# Patient Record
Sex: Female | Born: 1950 | Race: White | Hispanic: No | Marital: Married | Smoking: Never smoker
Health system: Southern US, Community
[De-identification: ages and names within clinical notes are randomized; demographics above are authoritative.]

## PROBLEM LIST (undated history)

## (undated) DIAGNOSIS — Z8371 Family history of colonic polyps: Secondary | ICD-10-CM

## (undated) DIAGNOSIS — E7800 Pure hypercholesterolemia, unspecified: Secondary | ICD-10-CM

## (undated) DIAGNOSIS — T7840XA Allergy, unspecified, initial encounter: Secondary | ICD-10-CM

## (undated) DIAGNOSIS — J189 Pneumonia, unspecified organism: Secondary | ICD-10-CM

## (undated) DIAGNOSIS — K648 Other hemorrhoids: Secondary | ICD-10-CM

## (undated) DIAGNOSIS — E669 Obesity, unspecified: Secondary | ICD-10-CM

## (undated) DIAGNOSIS — M8580 Other specified disorders of bone density and structure, unspecified site: Secondary | ICD-10-CM

## (undated) DIAGNOSIS — K219 Gastro-esophageal reflux disease without esophagitis: Secondary | ICD-10-CM

## (undated) DIAGNOSIS — M1990 Unspecified osteoarthritis, unspecified site: Secondary | ICD-10-CM

## (undated) DIAGNOSIS — Z83719 Family history of colon polyps, unspecified: Secondary | ICD-10-CM

## (undated) HISTORY — DX: Other specified disorders of bone density and structure, unspecified site: M85.80

## (undated) HISTORY — DX: Pure hypercholesterolemia, unspecified: E78.00

## (undated) HISTORY — DX: Obesity, unspecified: E66.9

## (undated) HISTORY — PX: ABDOMINAL HYSTERECTOMY: SHX81

## (undated) HISTORY — PX: ANAL FISSURE REPAIR: SHX2312

## (undated) HISTORY — DX: Allergy, unspecified, initial encounter: T78.40XA

## (undated) HISTORY — DX: Family history of colon polyps, unspecified: Z83.719

## (undated) HISTORY — DX: Pneumonia, unspecified organism: J18.9

## (undated) HISTORY — PX: BREAST SURGERY: SHX581

## (undated) HISTORY — DX: Other hemorrhoids: K64.8

## (undated) HISTORY — DX: Family history of colonic polyps: Z83.71

## (undated) HISTORY — PX: APPENDECTOMY: SHX54

## (undated) HISTORY — PX: OTHER SURGICAL HISTORY: SHX169

## (undated) HISTORY — PX: BREAST BIOPSY: SHX20

## (undated) HISTORY — PX: CERVICAL CONE BIOPSY: SUR198

## (undated) HISTORY — PX: SPINE SURGERY: SHX786

---

## 1998-07-24 HISTORY — PX: BACK SURGERY: SHX140

## 1999-07-20 ENCOUNTER — Encounter: Payer: Self-pay | Admitting: Neurosurgery

## 1999-07-21 ENCOUNTER — Inpatient Hospital Stay (HOSPITAL_COMMUNITY): Admission: RE | Admit: 1999-07-21 | Discharge: 1999-07-23 | Payer: Self-pay | Admitting: Neurosurgery

## 1999-08-18 ENCOUNTER — Ambulatory Visit (HOSPITAL_COMMUNITY): Admission: RE | Admit: 1999-08-18 | Discharge: 1999-08-18 | Payer: Self-pay | Admitting: Neurosurgery

## 1999-08-18 ENCOUNTER — Encounter: Payer: Self-pay | Admitting: Neurosurgery

## 1999-09-07 ENCOUNTER — Ambulatory Visit (HOSPITAL_COMMUNITY): Admission: RE | Admit: 1999-09-07 | Discharge: 1999-09-07 | Payer: Self-pay | Admitting: Neurosurgery

## 1999-09-07 ENCOUNTER — Encounter: Payer: Self-pay | Admitting: Neurosurgery

## 2000-12-13 ENCOUNTER — Ambulatory Visit (HOSPITAL_COMMUNITY): Admission: RE | Admit: 2000-12-13 | Discharge: 2000-12-13 | Payer: Self-pay | Admitting: Neurosurgery

## 2000-12-13 ENCOUNTER — Encounter: Payer: Self-pay | Admitting: Neurosurgery

## 2004-05-12 ENCOUNTER — Ambulatory Visit: Payer: Self-pay | Admitting: Anesthesiology

## 2004-08-29 ENCOUNTER — Ambulatory Visit: Payer: Self-pay | Admitting: Anesthesiology

## 2004-11-14 ENCOUNTER — Ambulatory Visit: Payer: Self-pay | Admitting: Anesthesiology

## 2004-11-23 ENCOUNTER — Ambulatory Visit: Payer: Self-pay | Admitting: Family Medicine

## 2004-12-06 ENCOUNTER — Ambulatory Visit: Payer: Self-pay

## 2005-02-09 ENCOUNTER — Ambulatory Visit: Payer: Self-pay | Admitting: Anesthesiology

## 2005-05-17 ENCOUNTER — Ambulatory Visit: Payer: Self-pay | Admitting: Anesthesiology

## 2005-07-11 ENCOUNTER — Ambulatory Visit: Payer: Self-pay | Admitting: Anesthesiology

## 2005-11-02 ENCOUNTER — Ambulatory Visit: Payer: Self-pay | Admitting: Anesthesiology

## 2006-01-18 ENCOUNTER — Ambulatory Visit: Payer: Self-pay

## 2006-01-29 ENCOUNTER — Ambulatory Visit: Payer: Self-pay | Admitting: Anesthesiology

## 2006-06-19 ENCOUNTER — Ambulatory Visit: Payer: Self-pay | Admitting: Anesthesiology

## 2006-09-05 ENCOUNTER — Ambulatory Visit: Payer: Self-pay | Admitting: Anesthesiology

## 2006-11-16 ENCOUNTER — Ambulatory Visit: Payer: Self-pay | Admitting: Family Medicine

## 2006-11-26 ENCOUNTER — Ambulatory Visit: Payer: Self-pay | Admitting: Anesthesiology

## 2007-01-21 ENCOUNTER — Ambulatory Visit: Payer: Self-pay | Admitting: Family Medicine

## 2007-02-22 ENCOUNTER — Ambulatory Visit: Payer: Self-pay | Admitting: Unknown Physician Specialty

## 2007-11-07 ENCOUNTER — Ambulatory Visit: Payer: Self-pay | Admitting: Anesthesiology

## 2007-11-14 ENCOUNTER — Encounter: Payer: Self-pay | Admitting: Pain Medicine

## 2007-12-24 ENCOUNTER — Ambulatory Visit: Payer: Self-pay | Admitting: Anesthesiology

## 2008-01-22 ENCOUNTER — Ambulatory Visit: Payer: Self-pay | Admitting: Family Medicine

## 2011-09-25 ENCOUNTER — Ambulatory Visit: Payer: Self-pay

## 2012-11-04 ENCOUNTER — Ambulatory Visit: Payer: Self-pay

## 2012-11-20 ENCOUNTER — Ambulatory Visit: Payer: Self-pay

## 2014-01-01 ENCOUNTER — Ambulatory Visit: Payer: Self-pay

## 2014-10-16 ENCOUNTER — Ambulatory Visit: Payer: Self-pay

## 2014-10-21 ENCOUNTER — Ambulatory Visit
Admit: 2014-10-21 | Disposition: A | Discharge status: Home / Self Care | Payer: Self-pay | Attending: Unknown Physician Specialty | Admitting: Unknown Physician Specialty

## 2015-02-17 DIAGNOSIS — M8580 Other specified disorders of bone density and structure, unspecified site: Secondary | ICD-10-CM

## 2015-02-17 DIAGNOSIS — E7800 Pure hypercholesterolemia, unspecified: Secondary | ICD-10-CM

## 2015-02-17 DIAGNOSIS — J309 Allergic rhinitis, unspecified: Secondary | ICD-10-CM | POA: Insufficient documentation

## 2015-02-17 DIAGNOSIS — K635 Polyp of colon: Secondary | ICD-10-CM

## 2015-02-17 DIAGNOSIS — K648 Other hemorrhoids: Secondary | ICD-10-CM

## 2015-02-17 DIAGNOSIS — E669 Obesity, unspecified: Secondary | ICD-10-CM

## 2015-02-17 DIAGNOSIS — E785 Hyperlipidemia, unspecified: Secondary | ICD-10-CM

## 2015-02-19 ENCOUNTER — Ambulatory Visit (INDEPENDENT_AMBULATORY_CARE_PROVIDER_SITE_OTHER): Payer: BC Managed Care – PPO | Admitting: Unknown Physician Specialty

## 2015-02-19 ENCOUNTER — Encounter: Payer: Self-pay | Admitting: Unknown Physician Specialty

## 2015-02-19 VITALS — BP 113/78 | HR 67 | Temp 97.6°F | Ht 64.3 in | Wt 210.2 lb

## 2015-02-19 DIAGNOSIS — M8580 Other specified disorders of bone density and structure, unspecified site: Secondary | ICD-10-CM | POA: Diagnosis not present

## 2015-02-19 DIAGNOSIS — E785 Hyperlipidemia, unspecified: Secondary | ICD-10-CM

## 2015-02-19 DIAGNOSIS — Z0000 Encounter for general adult medical examination without abnormal findings: Secondary | ICD-10-CM

## 2015-02-19 NOTE — Patient Instructions (Signed)
Think you're too busy to work out? We have the workout for you. In minutes, high-intensity interval training (H.I.I.T.) will have you sweating, breathing hard and maximizing the health benefits of exercise without the time commitment. Best of all, it's scientifically proven to work.  What Is H.I.I.T.? SHORT WORKOUTS 101 High-intensity interval training - referred to as H.I.I.T. - is based on the idea that short bursts of strenuous exercise can have a big impact on the body. If moderate exercise - like a 20-minute jog - is good for your heart, lungs and metabolism, H.I.I.T. packs the benefits of that workout and more into a few minutes. It may sound too good to be true, but learning this exercise technique and adapting it to your life can mean saving hours at the gym. If you think you don't have time to exercise, H.I.I.T. may be the workout for you.  You can try it with any aerobic activity you like. The principles of H.I.I.T. can be applied to running, biking, stair climbing, swimming, jumping rope, rowing, even hopping or skipping. (Yes, skipping!)  The downside? Even though H.I.I.T. lasts only minutes, the workouts are tough, requiring you to push your body near its limit.  HOW INTENSE IS HIGH INTENSITY? High-intensity exercise is obviously not a casual stroll down the street, but it's not a run-till-your-lungs-pop explosion, either. Think breathless, not winded. Heart-pounding, not exploding. Legs pumping, but not uncontrolled.  You don't need any fancy heart rate monitors to do these workouts. Use cues from your body as a guide. In the middle of a high-intensity workout you should be able to say single words, but not complete whole sentences. So, if you can keep chatting to your workout partner during this workout, pump it up a few notches.  05-12-29 Training This simple program will help you make the most of a short workout by improving heart health and endurance. Try it with your favorite  cardiovascular activity. The essentials of 05-12-29 training are simple. Run, ride or perhaps row on a rowing machine gently for 30 seconds, accelerate to a moderate pace for 20 seconds, then sprint as hard as you can for 10 seconds. (It should be called 30-20-10 training, obviously, but that is not as catchy.) Repeat.  You don't even need a stopwatch to monitor the 30-, 20-, and 10-second time changes. You can just count to yourself, which seems to make the intervals pass more quickly.  Best of all? The grueling, all-out portion of the workout lasts for only 10 seconds. C'mon, you can do anything for 10 seconds, right?  Got 10 Minutes? A solitary minute of hard work buried in 10 minutes of activity can make a big difference.  The 10-Minute Workout If you like to run, bike, row or swim - just a little bit - this workout is a great option for you. Step 1 Warm up for 2 minutes Step 2 Pedal, run or swim all-out for 20 seconds. Repeat 2 more times Warm down for 3 minutes    GET STARTED To benefit the most from really, really short workouts, you need to build the habit of doing them into your hectic life. Ideally, you'll complete the workout three times a week. The best way to build that habit is to start small and be willing to tweak your schedule where you can to accommodate your new workout.  First set up a spot in your house for your workout, equipped with whatever you need to get the job done: sneakers, a  chair, a towel, etc. Then slot your workout in before you would normally shower. (You can even do it in the bathroom.) Or wake up five minutes earlier and do it first thing in the morning, so you can head off to work feeling accomplished. Or do it during your lunch hour. Run up your office's stairs or grab a private conference room for just a few minutes. Or work it into your commute. If you walk or bike to work, add some heavy intervals on the way home.  GET A BOOST FROM MUSIC Creating a  workout playlist of high-energy tunes you love will not make your workout feel easier, but it may cause you to exercise harder without even realizing it. Best of all, if you are doing a really short workout, you need only one or two great tunes to get you through. If you are willing to try something a bit different, make your own music as you exercise. Sing, hum, clap your hands, whatever you can do to jam along to your playlist. It may give you an extra boost to finish strong.  Find a song or podcast that's the length of your really, really short workout. By the time the song is over, you're done.  Excerpted from the [employer] Well column http://www.nytimes.com/well/guides/really-really-short-workouts?smid=fb-nytwell&smtyp=pay  

## 2015-02-19 NOTE — Progress Notes (Signed)
BP 113/78 mmHg  Pulse 67  Temp(Src) 97.6 F (36.4 C)  Ht 5' 4.3" (1.633 m)  Wt 210 lb 3.2 oz (95.346 kg)  BMI 35.75 kg/m2  SpO2 100%  LMP  (LMP Unknown)   Subjective:    Patient ID: Patricia Ferguson, female    DOB: 1951-03-20, 64 y.o.   MRN: [id number]  HPI: Patricia Ferguson is a 64 y.o. female  Chief Complaint  Patient presents with  . Annual Exam     Relevant past medical, surgical, family and social history reviewed and updated as indicated. Interim medical history since our last visit reviewed. Allergies and medications reviewed and updated.  Went to [facility] a couple of weeks ago and diagnosed with walking pneumonia.  She is better now.    Review of Systems  Unable to perform ROS Constitutional: Negative.   HENT: Negative.   Eyes: Negative.   Respiratory: Negative.   Cardiovascular: Negative.   Gastrointestinal: Negative.   Endocrine: Negative.   Genitourinary: Negative.   Musculoskeletal: Negative.   Skin: Negative.   Allergic/Immunologic: Negative.   Neurological: Negative.   Hematological: Negative.   Psychiatric/Behavioral: Negative.   All other systems reviewed and are negative.   Per HPI unless specifically indicated above     Objective:    BP 113/78 mmHg  Pulse 67  Temp(Src) 97.6 F (36.4 C)  Ht 5' 4.3" (1.633 m)  Wt 210 lb 3.2 oz (95.346 kg)  BMI 35.75 kg/m2  SpO2 100%  LMP  (LMP Unknown)  Wt Readings from Last 3 Encounters:  02/19/15 210 lb 3.2 oz (95.346 kg)  02/17/14 208 lb (94.348 kg)    Physical Exam  Constitutional: She is oriented to person, place, and time. She appears well-developed and well-nourished.  HENT:  Head: Normocephalic and atraumatic.  Eyes: Pupils are equal, round, and reactive to light. Right eye exhibits no discharge. Left eye exhibits no discharge. No scleral icterus.  Neck: Normal range of motion. Neck supple. Carotid bruit is not present. No thyromegaly present.  Cardiovascular: Normal rate, regular  rhythm and normal heart sounds.  Exam reveals no gallop and no friction rub.   No murmur heard. Pulmonary/Chest: Effort normal and breath sounds normal. No respiratory distress. She has no wheezes. She has no rales.  Abdominal: Soft. Bowel sounds are normal. There is no tenderness. There is no rebound.  Genitourinary: No breast swelling, tenderness or discharge.  Musculoskeletal: Normal range of motion.  Lymphadenopathy:    She has no cervical adenopathy.  Neurological: She is alert and oriented to person, place, and time.  Skin: Skin is warm, dry and intact. No rash noted.  Psychiatric: She has a normal mood and affect. Her speech is normal and behavior is normal. Judgment and thought content normal. Cognition and memory are normal.    No results found for this or any previous visit.    Assessment & Plan:   Problem List Items Addressed This Visit      Unprioritized   Osteopenia   Hyperlipidemia - Primary   Relevant Orders   Lipid Panel w/o Chol/HDL Ratio    Other Visit Diagnoses    Annual physical exam        Relevant Orders    CBC with Differential/Platelet    Comprehensive metabolic panel    HIV antibody    Lipid Panel w/o Chol/HDL Ratio    TSH        Follow up plan: Return in about 1 year (around 02/19/2016).

## 2015-02-20 LAB — CBC WITH DIFFERENTIAL/PLATELET
BASOS: 2 %
Basophils Absolute: 0.1 10*3/uL (ref 0.0–0.2)
EOS (ABSOLUTE): 0.1 10*3/uL (ref 0.0–0.4)
Eos: 3 %
Hematocrit: 36.7 % (ref 34.0–46.6)
Hemoglobin: 12.9 g/dL (ref 11.1–15.9)
IMMATURE GRANS (ABS): 0 10*3/uL (ref 0.0–0.1)
Immature Granulocytes: 0 %
LYMPHS ABS: 1.5 10*3/uL (ref 0.7–3.1)
Lymphs: 38 %
MCH: 31.8 pg (ref 26.6–33.0)
MCHC: 35.1 g/dL (ref 31.5–35.7)
MCV: 90 fL (ref 79–97)
MONOS ABS: 0.4 10*3/uL (ref 0.1–0.9)
Monocytes: 11 %
Neutrophils Absolute: 1.8 10*3/uL (ref 1.4–7.0)
Neutrophils: 46 %
PLATELETS: 147 10*3/uL — AB (ref 150–379)
RBC: 4.06 x10E6/uL (ref 3.77–5.28)
RDW: 13.4 % (ref 12.3–15.4)
WBC: 3.9 10*3/uL (ref 3.4–10.8)

## 2015-02-20 LAB — COMPREHENSIVE METABOLIC PANEL
A/G RATIO: 1.7 (ref 1.1–2.5)
ALBUMIN: 3.9 g/dL (ref 3.6–4.8)
ALK PHOS: 47 IU/L (ref 39–117)
ALT: 24 IU/L (ref 0–32)
AST: 24 IU/L (ref 0–40)
BILIRUBIN TOTAL: 0.9 mg/dL (ref 0.0–1.2)
BUN / CREAT RATIO: 20 (ref 11–26)
BUN: 14 mg/dL (ref 8–27)
CHLORIDE: 105 mmol/L (ref 97–108)
CO2: 27 mmol/L (ref 18–29)
CREATININE: 0.71 mg/dL (ref 0.57–1.00)
Calcium: 8.8 mg/dL (ref 8.7–10.3)
GFR calc Af Amer: 106 mL/min/{1.73_m2} (ref >59)
GFR, EST NON AFRICAN AMERICAN: 92 mL/min/{1.73_m2} (ref >59)
GLOBULIN, TOTAL: 2.3 g/dL (ref 1.5–4.5)
Glucose: 97 mg/dL (ref 65–99)
Potassium: 4.5 mmol/L (ref 3.5–5.2)
Sodium: 144 mmol/L (ref 134–144)
Total Protein: 6.2 g/dL (ref 6.0–8.5)

## 2015-02-20 LAB — LIPID PANEL W/O CHOL/HDL RATIO
Cholesterol, Total: 211 mg/dL — ABNORMAL HIGH (ref 100–199)
HDL: 47 mg/dL (ref >39)
LDL Calculated: 146 mg/dL — ABNORMAL HIGH (ref 0–99)
TRIGLYCERIDES: 90 mg/dL (ref 0–149)
VLDL Cholesterol Cal: 18 mg/dL (ref 5–40)

## 2015-02-20 LAB — TSH: TSH: 2.84 u[IU]/mL (ref 0.450–4.500)

## 2015-02-20 LAB — HIV ANTIBODY (ROUTINE TESTING W REFLEX): HIV Screen 4th Generation wRfx: NONREACTIVE

## 2015-02-22 ENCOUNTER — Encounter: Payer: Self-pay | Admitting: Unknown Physician Specialty

## 2015-04-12 ENCOUNTER — Telehealth: Payer: Self-pay

## 2015-05-21 ENCOUNTER — Other Ambulatory Visit: Payer: BC Managed Care – PPO

## 2015-05-21 DIAGNOSIS — E7800 Pure hypercholesterolemia, unspecified: Secondary | ICD-10-CM

## 2015-05-22 LAB — LIPID PANEL W/O CHOL/HDL RATIO
Cholesterol, Total: 195 mg/dL (ref 100–199)
HDL: 38 mg/dL — ABNORMAL LOW (ref >39)
LDL Calculated: 129 mg/dL — ABNORMAL HIGH (ref 0–99)
Triglycerides: 141 mg/dL (ref 0–149)
VLDL Cholesterol Cal: 28 mg/dL (ref 5–40)

## 2015-06-22 ENCOUNTER — Telehealth: Payer: Self-pay

## 2015-06-22 NOTE — Telephone Encounter (Signed)
Please let her know it is much better.  Her Total went form 211-195 and her "bad" cholesterol went from 146-129

## 2015-06-22 NOTE — Telephone Encounter (Signed)
Called and let patient know about lab results.

## 2015-06-22 NOTE — Telephone Encounter (Signed)
Patient called and left me a voicemail asking about results of cholesterol done on 05/21/15. What can I tell her about her lab?

## 2015-06-29 NOTE — Telephone Encounter (Signed)
done

## 2015-07-30 ENCOUNTER — Other Ambulatory Visit: Payer: Self-pay

## 2015-07-30 MED ORDER — EZETIMIBE 10 MG PO TABS: 10.0000 mg | ORAL_TABLET | Freq: Every day | ORAL | Status: DC

## 2015-07-30 NOTE — Telephone Encounter (Signed)
Patient was last seen 02/19/15 and has appointment 02/21/16. Pharmacy is [facility].

## 2015-09-27 ENCOUNTER — Other Ambulatory Visit: Payer: Self-pay

## 2015-09-27 MED ORDER — RALOXIFENE HCL 60 MG PO TABS: 60.0000 mg | ORAL_TABLET | Freq: Every day | ORAL | Status: DC

## 2015-09-27 NOTE — Telephone Encounter (Signed)
Patient was last seen 02/19/15 and has appointment 02/21/16. Pharmacy is [facility].

## 2016-02-21 ENCOUNTER — Ambulatory Visit (INDEPENDENT_AMBULATORY_CARE_PROVIDER_SITE_OTHER): Payer: BC Managed Care – PPO | Admitting: Unknown Physician Specialty

## 2016-02-21 ENCOUNTER — Encounter: Payer: Self-pay | Admitting: Unknown Physician Specialty

## 2016-02-21 VITALS — BP 129/80 | HR 73 | Temp 98.5°F | Ht 64.0 in | Wt 220.2 lb

## 2016-02-21 DIAGNOSIS — E785 Hyperlipidemia, unspecified: Secondary | ICD-10-CM | POA: Diagnosis not present

## 2016-02-21 DIAGNOSIS — Z0000 Encounter for general adult medical examination without abnormal findings: Secondary | ICD-10-CM

## 2016-02-21 NOTE — Progress Notes (Signed)
BP 129/80 (BP Location: Left Arm, Patient Position: Sitting, Cuff Size: Large)   Pulse 73   Temp 98.5 F (36.9 C)   Ht 5\' 4"  (1.626 m)   Wt 220 lb 3.2 oz (99.9 kg)   LMP  (LMP Unknown)   SpO2 98%   BMI 37.80 kg/m    Subjective:    Patient ID: Patricia Ferguson, female    DOB: 09/23/1950, 65 y.o.   MRN: [id number]  HPI: Patricia Ferguson is a 65 y.o. female  Chief Complaint  Patient presents with  . Annual Exam    Hep C order entered    Pt is having stress now as her husband is in the hospital  Hyperlipidemia Using red yeast rice and flaxseed Using medications without problems: No Muscle aches  Diet compliance: "stressed out" Exercise: "trying to"  Social History   Social History  . Marital status: Married    Spouse name: N/A  . Number of children: N/A  . Years of education: N/A   Occupational History  . Not on file.   Social History Main Topics  . Smoking status: Never Smoker  . Smokeless tobacco: Never Used  . Alcohol use 0.0 oz/week     Comment: on occasion  . Drug use: No  . Sexual activity: No   Other Topics Concern  . Not on file   Social History Narrative  . No narrative on file   Past Medical History:  Diagnosis Date  . Allergy   . FH: colonic polyps   . Hypercholesterolemia   . Internal hemorrhoids   . Obesity   . Osteopenia   . Walking pneumonia    Past Surgical History:  Procedure Laterality Date  . ABDOMINAL HYSTERECTOMY    . ANAL FISSURE REPAIR    . APPENDECTOMY    . bone spur    . BREAST SURGERY    . CERVICAL CONE BIOPSY    . lumbar fission      Relevant past medical, surgical, family and social history reviewed and updated as indicated. Interim medical history since our last visit reviewed. Allergies and medications reviewed and updated.  Review of Systems  Per HPI unless specifically indicated above     Objective:    BP 129/80 (BP Location: Left Arm, Patient Position: Sitting, Cuff Size: Large)   Pulse 73   Temp 98.5  F (36.9 C)   Ht 5\' 4"  (1.626 m)   Wt 220 lb 3.2 oz (99.9 kg)   LMP  (LMP Unknown)   SpO2 98%   BMI 37.80 kg/m   Wt Readings from Last 3 Encounters:  02/21/16 220 lb 3.2 oz (99.9 kg)  02/19/15 210 lb 3.2 oz (95.3 kg)  02/17/14 208 lb (94.3 kg)    Physical Exam  Constitutional: She is oriented to person, place, and time. She appears well-developed and well-nourished.  HENT:  Head: Normocephalic and atraumatic.  Eyes: Pupils are equal, round, and reactive to light. Right eye exhibits no discharge. Left eye exhibits no discharge. No scleral icterus.  Neck: Normal range of motion. Neck supple. Carotid bruit is not present. No thyromegaly present.  Cardiovascular: Normal rate, regular rhythm and normal heart sounds.  Exam reveals no gallop and no friction rub.   No murmur heard. Pulmonary/Chest: Effort normal and breath sounds normal. No respiratory distress. She has no wheezes. She has no rales.  Abdominal: Soft. Bowel sounds are normal. There is no tenderness. There is no rebound.  Genitourinary: No breast swelling,  tenderness or discharge.  Musculoskeletal: Normal range of motion.  Lymphadenopathy:    She has no cervical adenopathy.  Neurological: She is alert and oriented to person, place, and time.  Skin: Skin is warm, dry and intact. No rash noted.  Psychiatric: She has a normal mood and affect. Her speech is normal and behavior is normal. Judgment and thought content normal. Cognition and memory are normal.  Nursing note and vitals reviewed.   Results for orders placed or performed in visit on 05/21/15  Lipid Panel w/o Chol/HDL Ratio  Result Value Ref Range   Cholesterol, Total 195 100 - 199 mg/dL   Triglycerides 141 0 - 149 mg/dL   HDL 38 (L) >39 mg/dL   VLDL Cholesterol Cal 28 5 - 40 mg/dL   LDL Calculated 129 (H) 0 - 99 mg/dL      Assessment & Plan:   Problem List Items Addressed This Visit      Unprioritized   Hyperlipidemia   Relevant Orders   Lipid Panel w/o  Chol/HDL Ratio    Other Visit Diagnoses    Health care maintenance    -  Primary   Relevant Orders   Hepatitis C antibody   MM DIGITAL SCREENING BILATERAL   Lipid Panel w/o Chol/HDL Ratio   Comprehensive metabolic panel   CBC with Differential/Platelet   TSH       Follow up plan: Return in about 6 months (around 08/23/2016).

## 2016-02-22 LAB — COMPREHENSIVE METABOLIC PANEL
A/G RATIO: 1.9 (ref 1.2–2.2)
ALT: 19 IU/L (ref 0–32)
AST: 23 IU/L (ref 0–40)
Albumin: 4.3 g/dL (ref 3.6–4.8)
Alkaline Phosphatase: 57 IU/L (ref 39–117)
BILIRUBIN TOTAL: 1.1 mg/dL (ref 0.0–1.2)
BUN/Creatinine Ratio: 22 (ref 12–28)
BUN: 16 mg/dL (ref 8–27)
CALCIUM: 9.3 mg/dL (ref 8.7–10.3)
CHLORIDE: 103 mmol/L (ref 96–106)
CO2: 25 mmol/L (ref 18–29)
Creatinine, Ser: 0.74 mg/dL (ref 0.57–1.00)
GFR, EST AFRICAN AMERICAN: 100 mL/min/{1.73_m2} (ref >59)
GFR, EST NON AFRICAN AMERICAN: 86 mL/min/{1.73_m2} (ref >59)
GLOBULIN, TOTAL: 2.3 g/dL (ref 1.5–4.5)
Glucose: 85 mg/dL (ref 65–99)
POTASSIUM: 4.2 mmol/L (ref 3.5–5.2)
SODIUM: 143 mmol/L (ref 134–144)
TOTAL PROTEIN: 6.6 g/dL (ref 6.0–8.5)

## 2016-02-22 LAB — TSH: TSH: 3.78 u[IU]/mL (ref 0.450–4.500)

## 2016-02-22 LAB — CBC WITH DIFFERENTIAL/PLATELET
BASOS ABS: 0.1 10*3/uL (ref 0.0–0.2)
BASOS: 1 %
EOS (ABSOLUTE): 0.1 10*3/uL (ref 0.0–0.4)
Eos: 2 %
Hematocrit: 38.8 % (ref 34.0–46.6)
Hemoglobin: 13.1 g/dL (ref 11.1–15.9)
IMMATURE GRANS (ABS): 0 10*3/uL (ref 0.0–0.1)
Immature Granulocytes: 0 %
LYMPHS ABS: 2.1 10*3/uL (ref 0.7–3.1)
Lymphs: 35 %
MCH: 30.2 pg (ref 26.6–33.0)
MCHC: 33.8 g/dL (ref 31.5–35.7)
MCV: 89 fL (ref 79–97)
MONOS ABS: 0.5 10*3/uL (ref 0.1–0.9)
Monocytes: 8 %
NEUTROS ABS: 3.1 10*3/uL (ref 1.4–7.0)
Neutrophils: 54 %
PLATELETS: 181 10*3/uL (ref 150–379)
RBC: 4.34 x10E6/uL (ref 3.77–5.28)
RDW: 13.1 % (ref 12.3–15.4)
WBC: 5.8 10*3/uL (ref 3.4–10.8)

## 2016-02-22 LAB — LIPID PANEL W/O CHOL/HDL RATIO
CHOLESTEROL TOTAL: 191 mg/dL (ref 100–199)
HDL: 43 mg/dL (ref >39)
LDL CALC: 116 mg/dL — AB (ref 0–99)
TRIGLYCERIDES: 160 mg/dL — AB (ref 0–149)
VLDL CHOLESTEROL CAL: 32 mg/dL (ref 5–40)

## 2016-02-22 LAB — HEPATITIS C ANTIBODY

## 2016-03-29 ENCOUNTER — Other Ambulatory Visit: Payer: Self-pay | Admitting: Unknown Physician Specialty

## 2016-03-29 ENCOUNTER — Ambulatory Visit
Admission: RE | Admit: 2016-03-29 | Discharge: 2016-03-29 | Disposition: A | Discharge status: Home / Self Care | Payer: BC Managed Care – PPO | Source: Ambulatory Visit | Attending: Unknown Physician Specialty | Admitting: Unknown Physician Specialty

## 2016-03-29 DIAGNOSIS — Z0000 Encounter for general adult medical examination without abnormal findings: Secondary | ICD-10-CM

## 2016-03-29 DIAGNOSIS — Z1231 Encounter for screening mammogram for malignant neoplasm of breast: Secondary | ICD-10-CM

## 2016-07-09 ENCOUNTER — Other Ambulatory Visit: Payer: Self-pay | Admitting: Unknown Physician Specialty

## 2016-08-25 ENCOUNTER — Encounter: Payer: Self-pay | Admitting: Unknown Physician Specialty

## 2016-08-25 ENCOUNTER — Ambulatory Visit (INDEPENDENT_AMBULATORY_CARE_PROVIDER_SITE_OTHER): Payer: BC Managed Care – PPO | Admitting: Unknown Physician Specialty

## 2016-08-25 VITALS — BP 115/76 | HR 68 | Temp 97.8°F | Ht 64.1 in | Wt 214.0 lb

## 2016-08-25 DIAGNOSIS — E7800 Pure hypercholesterolemia, unspecified: Secondary | ICD-10-CM | POA: Diagnosis not present

## 2016-08-25 DIAGNOSIS — E785 Hyperlipidemia, unspecified: Secondary | ICD-10-CM | POA: Diagnosis not present

## 2016-08-25 DIAGNOSIS — Z5181 Encounter for therapeutic drug level monitoring: Secondary | ICD-10-CM | POA: Diagnosis not present

## 2016-08-25 NOTE — Assessment & Plan Note (Addendum)
Lipid panel drawn today. Will continue with current medication and encouraged to continue with diet and exercise.

## 2016-08-25 NOTE — Progress Notes (Signed)
BP 115/76 (BP Location: Left Arm, Patient Position: Sitting, Cuff Size: Large)   Pulse 68   Temp 97.8 F (36.6 C)   Ht 5' 4.1" (1.628 m)   Wt 214 lb (97.1 kg)   LMP  (LMP Unknown)   SpO2 99%   BMI 36.62 kg/m    Subjective:    Patient ID: Patricia Ferguson, female    DOB: Oct 30, 1950, 66 y.o.   MRN: [id number]  HPI: Patricia Ferguson is a 66 y.o. female  Chief Complaint  Patient presents with  . Hyperlipidemia   Hyperlipidemia Taking her medication as prescribed without difficulty. She is also taking Red Yeast Rice and Flaxseed. States she has been working on her diet and has been exercising at least once or twice a week. She is down 6 pounds since her last visit. Denies any headaches, dizziness, muscle aches or chest pain.  Relevant past medical, surgical, family and social history reviewed and updated as indicated. Interim medical history since our last visit reviewed. Allergies and medications reviewed and updated.  Review of Systems  Constitutional: Negative for fatigue.  Respiratory: Negative for chest tightness, shortness of breath and wheezing.   Cardiovascular: Negative for chest pain, palpitations and leg swelling.  Musculoskeletal: Negative for arthralgias and myalgias.  Neurological: Negative for dizziness, light-headedness and headaches.    Per HPI unless specifically indicated above     Objective:    BP 115/76 (BP Location: Left Arm, Patient Position: Sitting, Cuff Size: Large)   Pulse 68   Temp 97.8 F (36.6 C)   Ht 5' 4.1" (1.628 m)   Wt 214 lb (97.1 kg)   LMP  (LMP Unknown)   SpO2 99%   BMI 36.62 kg/m   Wt Readings from Last 3 Encounters:  08/25/16 214 lb (97.1 kg)  02/21/16 220 lb 3.2 oz (99.9 kg)  02/19/15 210 lb 3.2 oz (95.3 kg)    Physical Exam  Constitutional: She is oriented to person, place, and time. She appears well-developed and well-nourished. No distress.  HENT:  Head: Normocephalic and atraumatic.  Eyes: Conjunctivae are normal. Right  eye exhibits no discharge. Left eye exhibits no discharge. No scleral icterus.  Cardiovascular: Normal rate, regular rhythm and normal heart sounds.   Pulmonary/Chest: Effort normal and breath sounds normal. No respiratory distress.  Neurological: She is alert and oriented to person, place, and time.  Skin: Skin is warm and dry. She is not diaphoretic.  Psychiatric: She has a normal mood and affect. Her behavior is normal. Judgment and thought content normal.  Nursing note and vitals reviewed.   Results for orders placed or performed in visit on 02/21/16  Hepatitis C antibody  Result Value Ref Range   Hep C Virus Ab <0.1 0.0 - 0.9 s/co ratio  Lipid Panel w/o Chol/HDL Ratio  Result Value Ref Range   Cholesterol, Total 191 100 - 199 mg/dL   Triglycerides 160 (H) 0 - 149 mg/dL   HDL 43 >39 mg/dL   VLDL Cholesterol Cal 32 5 - 40 mg/dL   LDL Calculated 116 (H) 0 - 99 mg/dL  Comprehensive metabolic panel  Result Value Ref Range   Glucose 85 65 - 99 mg/dL   BUN 16 8 - 27 mg/dL   Creatinine, Ser 0.74 0.57 - 1.00 mg/dL   GFR calc non Af Amer 86 >59 mL/min/1.73   GFR calc Af Amer 100 >59 mL/min/1.73   BUN/Creatinine Ratio 22 12 - 28   Sodium 143 134 - 144 mmol/L  Potassium 4.2 3.5 - 5.2 mmol/L   Chloride 103 96 - 106 mmol/L   CO2 25 18 - 29 mmol/L   Calcium 9.3 8.7 - 10.3 mg/dL   Total Protein 6.6 6.0 - 8.5 g/dL   Albumin 4.3 3.6 - 4.8 g/dL   Globulin, Total 2.3 1.5 - 4.5 g/dL   Albumin/Globulin Ratio 1.9 1.2 - 2.2   Bilirubin Total 1.1 0.0 - 1.2 mg/dL   Alkaline Phosphatase 57 39 - 117 IU/L   AST 23 0 - 40 IU/L   ALT 19 0 - 32 IU/L  CBC with Differential/Platelet  Result Value Ref Range   WBC 5.8 3.4 - 10.8 x10E3/uL   RBC 4.34 3.77 - 5.28 x10E6/uL   Hemoglobin 13.1 11.1 - 15.9 g/dL   Hematocrit 38.8 34.0 - 46.6 %   MCV 89 79 - 97 fL   MCH 30.2 26.6 - 33.0 pg   MCHC 33.8 31.5 - 35.7 g/dL   RDW 13.1 12.3 - 15.4 %   Platelets 181 150 - 379 x10E3/uL   Neutrophils 54 %    Lymphs 35 %   Monocytes 8 %   Eos 2 %   Basos 1 %   Neutrophils Absolute 3.1 1.4 - 7.0 x10E3/uL   Lymphocytes Absolute 2.1 0.7 - 3.1 x10E3/uL   Monocytes Absolute 0.5 0.1 - 0.9 x10E3/uL   EOS (ABSOLUTE) 0.1 0.0 - 0.4 x10E3/uL   Basophils Absolute 0.1 0.0 - 0.2 x10E3/uL   Immature Granulocytes 0 %   Immature Grans (Abs) 0.0 0.0 - 0.1 x10E3/uL  TSH  Result Value Ref Range   TSH 3.780 0.450 - 4.500 uIU/mL      Assessment & Plan:   Problem List Items Addressed This Visit      Other   Pure hypercholesterolemia - Primary   Relevant Orders   Comprehensive metabolic panel   Lipid Panel w/o Chol/HDL Ratio   Hyperlipidemia    Lipid panel drawn today. Will continue with current medication and encouraged to continue with diet and exercise.      Relevant Orders   Comprehensive metabolic panel   Lipid Panel w/o Chol/HDL Ratio    Other Visit Diagnoses    Encounter for medication monitoring           Follow up plan: Return in about 6 months (around 02/22/2017) for physical.

## 2016-08-26 LAB — COMPREHENSIVE METABOLIC PANEL
A/G RATIO: 1.8 (ref 1.2–2.2)
ALBUMIN: 4 g/dL (ref 3.6–4.8)
ALK PHOS: 57 IU/L (ref 39–117)
ALT: 21 IU/L (ref 0–32)
AST: 18 IU/L (ref 0–40)
BUN / CREAT RATIO: 18 (ref 12–28)
BUN: 13 mg/dL (ref 8–27)
Bilirubin Total: 1 mg/dL (ref 0.0–1.2)
CO2: 25 mmol/L (ref 18–29)
Calcium: 8.8 mg/dL (ref 8.7–10.3)
Chloride: 104 mmol/L (ref 96–106)
Creatinine, Ser: 0.72 mg/dL (ref 0.57–1.00)
GFR calc Af Amer: 102 mL/min/{1.73_m2} (ref >59)
GFR calc non Af Amer: 89 mL/min/{1.73_m2} (ref >59)
GLOBULIN, TOTAL: 2.2 g/dL (ref 1.5–4.5)
Glucose: 89 mg/dL (ref 65–99)
POTASSIUM: 4.6 mmol/L (ref 3.5–5.2)
SODIUM: 142 mmol/L (ref 134–144)
Total Protein: 6.2 g/dL (ref 6.0–8.5)

## 2016-08-26 LAB — LIPID PANEL W/O CHOL/HDL RATIO
CHOLESTEROL TOTAL: 192 mg/dL (ref 100–199)
HDL: 42 mg/dL (ref >39)
LDL Calculated: 131 mg/dL — ABNORMAL HIGH (ref 0–99)
TRIGLYCERIDES: 96 mg/dL (ref 0–149)
VLDL Cholesterol Cal: 19 mg/dL (ref 5–40)

## 2016-10-27 ENCOUNTER — Other Ambulatory Visit: Payer: Self-pay | Admitting: Unknown Physician Specialty

## 2017-02-23 ENCOUNTER — Ambulatory Visit (INDEPENDENT_AMBULATORY_CARE_PROVIDER_SITE_OTHER): Payer: Medicare Other | Admitting: Unknown Physician Specialty

## 2017-02-23 ENCOUNTER — Encounter: Payer: Self-pay | Admitting: Unknown Physician Specialty

## 2017-02-23 ENCOUNTER — Other Ambulatory Visit: Payer: Self-pay | Admitting: Unknown Physician Specialty

## 2017-02-23 VITALS — BP 113/75 | HR 60 | Temp 97.9°F | Ht 63.7 in | Wt 211.1 lb

## 2017-02-23 DIAGNOSIS — Z5181 Encounter for therapeutic drug level monitoring: Secondary | ICD-10-CM | POA: Diagnosis not present

## 2017-02-23 DIAGNOSIS — Z7189 Other specified counseling: Secondary | ICD-10-CM | POA: Insufficient documentation

## 2017-02-23 DIAGNOSIS — Z0000 Encounter for general adult medical examination without abnormal findings: Secondary | ICD-10-CM

## 2017-02-23 DIAGNOSIS — E785 Hyperlipidemia, unspecified: Secondary | ICD-10-CM | POA: Diagnosis not present

## 2017-02-23 DIAGNOSIS — Z1231 Encounter for screening mammogram for malignant neoplasm of breast: Secondary | ICD-10-CM

## 2017-02-23 NOTE — Assessment & Plan Note (Addendum)
A voluntary discussion about advance care planning including the explanation and discussion of advance directives was extensively discussed  with the patient.  Explanation about the health care proxy and Living will was reviewed and packet with forms with explanation of how to fill them out was given.  During this discussion, the patient was able to identify a health care proxy as her son and plans.  Patient was offered a separate Story visit for further assistance with forms.   Marland Kitchen

## 2017-02-23 NOTE — Progress Notes (Signed)
BP 113/75   Pulse 60   Temp 97.9 F (36.6 C)   Ht 5' 3.7" (1.618 m)   Wt 211 lb 1.6 oz (95.8 kg)   LMP  (LMP Unknown)   SpO2 98%   BMI 36.58 kg/m    Subjective:    Patient ID: Patricia Ferguson, female    DOB: Aug 26, 1950, 66 y.o.   MRN: [id number]  HPI: Patricia Ferguson is a 66 y.o. female  Chief Complaint  Patient presents with  . Medicare Wellness   Functional Status Survey: Is the patient deaf or have difficulty hearing?: No Does the patient have difficulty seeing, even when wearing glasses/contacts?: No Does the patient have difficulty concentrating, remembering, or making decisions?: No Does the patient have difficulty walking or climbing stairs?: No Does the patient have difficulty dressing or bathing?: No Does the patient have difficulty doing errands alone such as visiting a doctor's office or shopping?: No  Fall Risk  02/21/2016  Falls in the past year? No   .  Hypertension Using medications without difficulty Average home BPs Not checking   No problems or lightheadedness No chest pain with exertion or shortness of breath No Edema  Hyperlipidemia Using red yeast rice without problems No Muscle aches  Diet compliance:Exercise:   Social History   Social History  . Marital status: Married    Spouse name: N/A  . Number of children: N/A  . Years of education: N/A   Occupational History  . Not on file.   Social History Main Topics  . Smoking status: Never Smoker  . Smokeless tobacco: Never Used  . Alcohol use 0.0 oz/week     Comment: on occasion  . Drug use: No  . Sexual activity: No   Other Topics Concern  . Not on file   Social History Narrative  . No narrative on file   Family History  Problem Relation Age of Onset  . Diabetes Mother   . Hyperlipidemia Mother   . Hypertension Mother   . Osteoporosis Mother   . Cancer Father        lung  . Diabetes Father   . Diabetes Sister   . Hyperlipidemia Sister   . Hypertension Sister   .  Hypertension Brother   . Hyperlipidemia Sister   . Breast cancer Neg Hx    Past Surgical History:  Procedure Laterality Date  . ABDOMINAL HYSTERECTOMY    . ANAL FISSURE REPAIR    . APPENDECTOMY    . bone spur    . BREAST BIOPSY Right 1980's  . BREAST SURGERY    . CERVICAL CONE BIOPSY    . lumbar fission       Relevant past medical, surgical, family and social history reviewed and updated as indicated. Interim medical history since our last visit reviewed. Allergies and medications reviewed and updated.  Review of Systems  Constitutional: Negative.   HENT: Negative.   Eyes: Negative.   Respiratory: Negative.   Cardiovascular: Negative.   Gastrointestinal: Negative.   Endocrine: Negative.   Genitourinary: Negative.   Musculoskeletal: Negative.   Skin: Negative.   Allergic/Immunologic: Negative.   Neurological: Negative.   Hematological: Negative.   Psychiatric/Behavioral: Negative.     Per HPI unless specifically indicated above     Objective:    BP 113/75   Pulse 60   Temp 97.9 F (36.6 C)   Ht 5' 3.7" (1.618 m)   Wt 211 lb 1.6 oz (95.8 kg)   LMP  (  LMP Unknown)   SpO2 98%   BMI 36.58 kg/m   Wt Readings from Last 3 Encounters:  02/23/17 211 lb 1.6 oz (95.8 kg)  08/25/16 214 lb (97.1 kg)  02/21/16 220 lb 3.2 oz (99.9 kg)    Physical Exam  Constitutional: She is oriented to person, place, and time. She appears well-developed and well-nourished.  HENT:  Head: Normocephalic and atraumatic.  Eyes: Pupils are equal, round, and reactive to light. Right eye exhibits no discharge. Left eye exhibits no discharge. No scleral icterus.  Neck: Normal range of motion. Neck supple. Carotid bruit is not present. No thyromegaly present.  Cardiovascular: Normal rate, regular rhythm and normal heart sounds.  Exam reveals no gallop and no friction rub.   No murmur heard. Pulmonary/Chest: Effort normal and breath sounds normal. No respiratory distress. She has no wheezes. She  has no rales.  Abdominal: Soft. Bowel sounds are normal. There is no tenderness. There is no rebound.  Genitourinary: No breast swelling, tenderness or discharge.  Musculoskeletal: Normal range of motion.  Lymphadenopathy:    She has no cervical adenopathy.  Neurological: She is alert and oriented to person, place, and time.  Skin: Skin is warm, dry and intact. No rash noted.  Psychiatric: She has a normal mood and affect. Her speech is normal and behavior is normal. Judgment and thought content normal. Cognition and memory are normal.    Results for orders placed or performed in visit on 08/25/16  Comprehensive metabolic panel  Result Value Ref Range   Glucose 89 65 - 99 mg/dL   BUN 13 8 - 27 mg/dL   Creatinine, Ser 0.72 0.57 - 1.00 mg/dL   GFR calc non Af Amer 89 >59 mL/min/1.73   GFR calc Af Amer 102 >59 mL/min/1.73   BUN/Creatinine Ratio 18 12 - 28   Sodium 142 134 - 144 mmol/L   Potassium 4.6 3.5 - 5.2 mmol/L   Chloride 104 96 - 106 mmol/L   CO2 25 18 - 29 mmol/L   Calcium 8.8 8.7 - 10.3 mg/dL   Total Protein 6.2 6.0 - 8.5 g/dL   Albumin 4.0 3.6 - 4.8 g/dL   Globulin, Total 2.2 1.5 - 4.5 g/dL   Albumin/Globulin Ratio 1.8 1.2 - 2.2   Bilirubin Total 1.0 0.0 - 1.2 mg/dL   Alkaline Phosphatase 57 39 - 117 IU/L   AST 18 0 - 40 IU/L   ALT 21 0 - 32 IU/L  Lipid Panel w/o Chol/HDL Ratio  Result Value Ref Range   Cholesterol, Total 192 100 - 199 mg/dL   Triglycerides 96 0 - 149 mg/dL   HDL 42 >39 mg/dL   VLDL Cholesterol Cal 19 5 - 40 mg/dL   LDL Calculated 131 (H) 0 - 99 mg/dL      Assessment & Plan:   Problem List Items Addressed This Visit      Unprioritized   Advanced care planning/counseling discussion    A voluntary discussion about advance care planning including the explanation and discussion of advance directives was extensively discussed  with the patient.  Explanation about the health care proxy and Living will was reviewed and packet with forms with  explanation of how to fill them out was given.  During this discussion, the patient was able to identify a health care proxy as her son and plans.  Patient was offered a separate [place] visit for further assistance with forms.   .      Hyperlipidemia  Stable, continue present medications.        Relevant Orders   Lipid Panel w/o Chol/HDL Ratio    Other Visit Diagnoses    Initial Medicare annual wellness visit    -  Primary   Relevant Orders   EKG 12-Lead (Completed)   Medication monitoring encounter       Relevant Orders   Comprehensive metabolic panel       Follow up plan: Return in about 6 months (around 08/26/2017).

## 2017-02-23 NOTE — Assessment & Plan Note (Signed)
Stable, continue present medications.   

## 2017-02-24 LAB — COMPREHENSIVE METABOLIC PANEL
A/G RATIO: 2.1 (ref 1.2–2.2)
ALK PHOS: 56 IU/L (ref 39–117)
ALT: 21 IU/L (ref 0–32)
AST: 25 IU/L (ref 0–40)
Albumin: 4.1 g/dL (ref 3.6–4.8)
BUN/Creatinine Ratio: 25 (ref 12–28)
BUN: 16 mg/dL (ref 8–27)
Bilirubin Total: 0.9 mg/dL (ref 0.0–1.2)
CALCIUM: 9 mg/dL (ref 8.7–10.3)
CHLORIDE: 102 mmol/L (ref 96–106)
CO2: 24 mmol/L (ref 20–29)
Creatinine, Ser: 0.64 mg/dL (ref 0.57–1.00)
GFR calc Af Amer: 109 mL/min/{1.73_m2} (ref >59)
GFR calc non Af Amer: 95 mL/min/{1.73_m2} (ref >59)
GLOBULIN, TOTAL: 2 g/dL (ref 1.5–4.5)
Glucose: 86 mg/dL (ref 65–99)
Potassium: 4 mmol/L (ref 3.5–5.2)
Sodium: 141 mmol/L (ref 134–144)
Total Protein: 6.1 g/dL (ref 6.0–8.5)

## 2017-02-24 LAB — LIPID PANEL W/O CHOL/HDL RATIO
CHOLESTEROL TOTAL: 201 mg/dL — AB (ref 100–199)
HDL: 42 mg/dL (ref >39)
LDL Calculated: 128 mg/dL — ABNORMAL HIGH (ref 0–99)
TRIGLYCERIDES: 153 mg/dL — AB (ref 0–149)
VLDL CHOLESTEROL CAL: 31 mg/dL (ref 5–40)

## 2017-02-26 NOTE — Progress Notes (Signed)
Normal labs.  Pt notified through mychart

## 2017-04-02 ENCOUNTER — Ambulatory Visit
Admission: RE | Admit: 2017-04-02 | Discharge: 2017-04-02 | Disposition: A | Discharge status: Home / Self Care | Payer: Medicare Other | Source: Ambulatory Visit | Attending: Unknown Physician Specialty | Admitting: Unknown Physician Specialty

## 2017-04-02 DIAGNOSIS — Z1231 Encounter for screening mammogram for malignant neoplasm of breast: Secondary | ICD-10-CM | POA: Insufficient documentation

## 2017-05-24 LAB — HM COLONOSCOPY

## 2017-08-11 ENCOUNTER — Other Ambulatory Visit: Payer: Self-pay | Admitting: Unknown Physician Specialty

## 2017-08-19 ENCOUNTER — Other Ambulatory Visit: Payer: Self-pay | Admitting: Unknown Physician Specialty

## 2017-08-28 ENCOUNTER — Ambulatory Visit: Payer: Medicare Other | Admitting: Unknown Physician Specialty

## 2017-08-28 ENCOUNTER — Encounter: Payer: Self-pay | Admitting: Unknown Physician Specialty

## 2017-08-28 VITALS — BP 101/69 | HR 68 | Temp 98.4°F | Ht 64.5 in | Wt 212.9 lb

## 2017-08-28 DIAGNOSIS — Z23 Encounter for immunization: Secondary | ICD-10-CM

## 2017-08-28 DIAGNOSIS — E785 Hyperlipidemia, unspecified: Secondary | ICD-10-CM

## 2017-08-28 NOTE — Patient Instructions (Signed)
Pneumococcal Conjugate Vaccine (PCV13) What You Need to Know 1. Why get vaccinated? Vaccination can protect both children and adults from pneumococcal disease. Pneumococcal disease is caused by bacteria that can spread from person to person through close contact. It can cause ear infections, and it can also lead to more serious infections of the:  Lungs (pneumonia),  Blood (bacteremia), and  Covering of the brain and spinal cord (meningitis).  Pneumococcal pneumonia is most common among adults. Pneumococcal meningitis can cause deafness and brain damage, and it kills about 1 child in 10 who get it. Anyone can get pneumococcal disease, but children under 2 years of age and adults 65 years and older, people with certain medical conditions, and cigarette smokers are at the highest risk. Before there was a vaccine, the [place] saw:  more than 700 cases of meningitis,  about 13,000 blood infections,  about 5 million ear infections, and  about 200 deaths  in children under 5 each year from pneumococcal disease. Since vaccine became available, severe pneumococcal disease in these children has fallen by 88%. About 18,000 older adults die of pneumococcal disease each year in the [place]. Treatment of pneumococcal infections with penicillin and other drugs is not as effective as it used to be, because some strains of the disease have become resistant to these drugs. This makes prevention of the disease, through vaccination, even more important. 2. PCV13 vaccine Pneumococcal conjugate vaccine (called PCV13) protects against 13 types of pneumococcal bacteria. PCV13 is routinely given to children at 2, 4, 6, and 12-15 months of age. It is also recommended for children and adults 2 to 64 years of age with certain health conditions, and for all adults 65 years of age and older. Your doctor can give you details. 3. Some people should not get this vaccine Anyone who has ever had a  life-threatening allergic reaction to a dose of this vaccine, to an earlier pneumococcal vaccine called PCV7, or to any vaccine containing diphtheria toxoid (for example, DTaP), should not get PCV13. Anyone with a severe allergy to any component of PCV13 should not get the vaccine. Tell your doctor if the person being vaccinated has any severe allergies. If the person scheduled for vaccination is not feeling well, your healthcare provider might decide to reschedule the shot on another day. 4. Risks of a vaccine reaction With any medicine, including vaccines, there is a chance of reactions. These are usually mild and go away on their own, but serious reactions are also possible. Problems reported following PCV13 varied by age and dose in the series. The most common problems reported among children were:  About half became drowsy after the shot, had a temporary loss of appetite, or had redness or tenderness where the shot was given.  About 1 out of 3 had swelling where the shot was given.  About 1 out of 3 had a mild fever, and about 1 in 20 had a fever over 102.2F.  Up to about 8 out of 10 became fussy or irritable.  Adults have reported pain, redness, and swelling where the shot was given; also mild fever, fatigue, headache, chills, or muscle pain. Young children who get PCV13 along with inactivated flu vaccine at the same time may be at increased risk for seizures caused by fever. Ask your doctor for more information. Problems that could happen after any vaccine:  People sometimes faint after a medical procedure, including vaccination. Sitting or lying down for about 15 minutes can help prevent   fainting, and injuries caused by a fall. Tell your doctor if you feel dizzy, or have vision changes or ringing in the ears.  Some older children and adults get severe pain in the shoulder and have difficulty moving the arm where a shot was given. This happens very rarely.  Any medication can cause a  severe allergic reaction. Such reactions from a vaccine are very rare, estimated at about 1 in a million doses, and would happen within a few minutes to a few hours after the vaccination. As with any medicine, there is a very small chance of a vaccine causing a serious injury or death. The safety of vaccines is always being monitored. For more information, visit: www.cdc.gov/vaccinesafety/ 5. What if there is a serious reaction? What should I look for? Look for anything that concerns you, such as signs of a severe allergic reaction, very high fever, or unusual behavior. Signs of a severe allergic reaction can include hives, swelling of the face and throat, difficulty breathing, a fast heartbeat, dizziness, and weakness-usually within a few minutes to a few hours after the vaccination. What should I do?  If you think it is a severe allergic reaction or other emergency that can't wait, call 9-1-1 or get the person to the nearest hospital. Otherwise, call your doctor.  Reactions should be reported to the Vaccine Adverse Event Reporting System (VAERS). Your doctor should file this report, or you can do it yourself through the VAERS web site at www.vaers.hhs.gov, or by calling 1-800-822-7967. ? VAERS does not give medical advice. 6. The National Vaccine Injury Compensation Program The National Vaccine Injury Compensation Program (VICP) is a federal program that was created to compensate people who may have been injured by certain vaccines. Persons who believe they may have been injured by a vaccine can learn about the program and about filing a claim by calling 1-800-338-2382 or visiting the VICP website at www.hrsa.gov/vaccinecompensation. There is a time limit to file a claim for compensation. 7. How can I learn more?  Ask your healthcare provider. He or she can give you the vaccine package insert or suggest other sources of information.  Call your local or state health department.  Contact the  Centers for Disease Control and Prevention (CDC): ? Call 1-800-232-4636 (1-800-CDC-INFO) or ? Visit CDC's website at www.cdc.gov/vaccines Vaccine Information Statement, PCV13 Vaccine (05/28/2014) This information is not intended to replace advice given to you by your health care provider. Make sure you discuss any questions you have with your health care provider. Document Released: 05/07/2006 Document Revised: 03/30/2016 Document Reviewed: 03/30/2016 Elsevier Interactive Patient Education  2017 Elsevier Inc.  

## 2017-08-28 NOTE — Assessment & Plan Note (Signed)
Check lipid panel and CMP. 

## 2017-08-28 NOTE — Progress Notes (Signed)
BP 101/69   Pulse 68   Temp 98.4 F (36.9 C) (Oral)   Ht 5' 4.5" (1.638 m)   Wt 212 lb 14.4 oz (96.6 kg)   LMP  (LMP Unknown)   SpO2 100%   BMI 35.98 kg/m    Subjective:    Patient ID: Patricia Ferguson, female    DOB: 08-16-1950, 67 y.o.   MRN: [id number]  HPI: Patricia Ferguson is a 67 y.o. female  Chief Complaint  Patient presents with  . Hyperlipidemia   Hyperlipidemia Using Zetia and Red Yeast Rice.   Medications without problems No Muscle aches  Diet compliance: Exercise: Watches what she eats.  Stays active  The 10-year ASCVD risk score Mikey Bussing DC Brooke Bonito., et al., 2013) is: 4%   Values used to calculate the score:     Age: 52 years     Sex: Female     Is Non-Hispanic African American: No     Diabetic: No     Tobacco smoker: No     Systolic Blood Pressure: 606 mmHg     Is BP treated: No     HDL Cholesterol: 42 mg/dL     Total Cholesterol: 201 mg/dL   Relevant past medical, surgical, family and social history reviewed and updated as indicated. Interim medical history since our last visit reviewed. Allergies and medications reviewed and updated.  Review of Systems  Constitutional: Negative.   HENT: Negative.   Eyes: Negative.   Respiratory: Negative.   Cardiovascular: Negative.   Gastrointestinal: Negative.   Endocrine: Negative.   Genitourinary: Negative.   Musculoskeletal: Negative.   Skin: Negative.   Allergic/Immunologic: Negative.   Neurological: Negative.   Hematological: Negative.   Psychiatric/Behavioral: Negative.    Per HPI unless specifically indicated above     Objective:    BP 101/69   Pulse 68   Temp 98.4 F (36.9 C) (Oral)   Ht 5' 4.5" (1.638 m)   Wt 212 lb 14.4 oz (96.6 kg)   LMP  (LMP Unknown)   SpO2 100%   BMI 35.98 kg/m   Wt Readings from Last 3 Encounters:  08/28/17 212 lb 14.4 oz (96.6 kg)  02/23/17 211 lb 1.6 oz (95.8 kg)  08/25/16 214 lb (97.1 kg)    Physical Exam  Constitutional: She is oriented to person, place, and  time. She appears well-developed and well-nourished. No distress.  HENT:  Head: Normocephalic and atraumatic.  Eyes: Conjunctivae and lids are normal. Right eye exhibits no discharge. Left eye exhibits no discharge. No scleral icterus.  Neck: Normal range of motion. Neck supple. No JVD present. Carotid bruit is not present.  Cardiovascular: Normal rate, regular rhythm and normal heart sounds.  Pulmonary/Chest: Effort normal and breath sounds normal.  Abdominal: Normal appearance. There is no splenomegaly or hepatomegaly.  Musculoskeletal: Normal range of motion.  Neurological: She is alert and oriented to person, place, and time.  Skin: Skin is warm, dry and intact. No rash noted. No pallor.  Psychiatric: She has a normal mood and affect. Her behavior is normal. Judgment and thought content normal.    Results for orders placed or performed in visit on 02/23/17  Comprehensive metabolic panel  Result Value Ref Range   Glucose 86 65 - 99 mg/dL   BUN 16 8 - 27 mg/dL   Creatinine, Ser 0.64 0.57 - 1.00 mg/dL   GFR calc non Af Amer 95 >59 mL/min/1.73   GFR calc Af Amer 109 >59 mL/min/1.73   BUN/Creatinine  Ratio 25 12 - 28   Sodium 141 134 - 144 mmol/L   Potassium 4.0 3.5 - 5.2 mmol/L   Chloride 102 96 - 106 mmol/L   CO2 24 20 - 29 mmol/L   Calcium 9.0 8.7 - 10.3 mg/dL   Total Protein 6.1 6.0 - 8.5 g/dL   Albumin 4.1 3.6 - 4.8 g/dL   Globulin, Total 2.0 1.5 - 4.5 g/dL   Albumin/Globulin Ratio 2.1 1.2 - 2.2   Bilirubin Total 0.9 0.0 - 1.2 mg/dL   Alkaline Phosphatase 56 39 - 117 IU/L   AST 25 0 - 40 IU/L   ALT 21 0 - 32 IU/L  Lipid Panel w/o Chol/HDL Ratio  Result Value Ref Range   Cholesterol, Total 201 (H) 100 - 199 mg/dL   Triglycerides 153 (H) 0 - 149 mg/dL   HDL 42 >39 mg/dL   VLDL Cholesterol Cal 31 5 - 40 mg/dL   LDL Calculated 128 (H) 0 - 99 mg/dL      Assessment & Plan:   Problem List Items Addressed This Visit      Unprioritized   Hyperlipidemia    Check lipid  panel and CMP      Relevant Orders   Lipid Panel w/o Chol/HDL Ratio   Comprehensive metabolic panel    Other Visit Diagnoses    Need for pneumococcal vaccination    -  Primary   Relevant Orders   Pneumococcal conjugate vaccine 13-valent IM (Completed)       Follow up plan: Return in about 6 months (around 02/25/2018).

## 2017-08-29 ENCOUNTER — Encounter: Payer: Self-pay | Admitting: Unknown Physician Specialty

## 2017-08-29 LAB — LIPID PANEL W/O CHOL/HDL RATIO
Cholesterol, Total: 190 mg/dL (ref 100–199)
HDL: 43 mg/dL (ref >39)
LDL CALC: 128 mg/dL — AB (ref 0–99)
Triglycerides: 95 mg/dL (ref 0–149)
VLDL CHOLESTEROL CAL: 19 mg/dL (ref 5–40)

## 2017-08-29 LAB — COMPREHENSIVE METABOLIC PANEL
A/G RATIO: 2 (ref 1.2–2.2)
ALT: 18 IU/L (ref 0–32)
AST: 23 IU/L (ref 0–40)
Albumin: 4.1 g/dL (ref 3.6–4.8)
Alkaline Phosphatase: 56 IU/L (ref 39–117)
BUN/Creatinine Ratio: 18 (ref 12–28)
BUN: 13 mg/dL (ref 8–27)
Bilirubin Total: 0.9 mg/dL (ref 0.0–1.2)
CHLORIDE: 105 mmol/L (ref 96–106)
CO2: 22 mmol/L (ref 20–29)
Calcium: 8.7 mg/dL (ref 8.7–10.3)
Creatinine, Ser: 0.71 mg/dL (ref 0.57–1.00)
GFR, EST AFRICAN AMERICAN: 103 mL/min/{1.73_m2} (ref >59)
GFR, EST NON AFRICAN AMERICAN: 90 mL/min/{1.73_m2} (ref >59)
GLUCOSE: 86 mg/dL (ref 65–99)
Globulin, Total: 2.1 g/dL (ref 1.5–4.5)
POTASSIUM: 4.3 mmol/L (ref 3.5–5.2)
Sodium: 142 mmol/L (ref 134–144)
Total Protein: 6.2 g/dL (ref 6.0–8.5)

## 2017-08-29 NOTE — Progress Notes (Signed)
Normal labs.  Pt notified through mychart

## 2018-02-04 ENCOUNTER — Encounter: Payer: Self-pay | Admitting: Unknown Physician Specialty

## 2018-02-21 ENCOUNTER — Other Ambulatory Visit: Payer: Self-pay | Admitting: Unknown Physician Specialty

## 2018-02-21 NOTE — Telephone Encounter (Signed)
Zetia 10 mg refill request  Has appt on 02/25/18 with Carles Collet, PA.    Refill during OV in case of changes?

## 2018-02-25 ENCOUNTER — Ambulatory Visit: Payer: Medicare Other | Admitting: Unknown Physician Specialty

## 2018-02-25 ENCOUNTER — Ambulatory Visit (INDEPENDENT_AMBULATORY_CARE_PROVIDER_SITE_OTHER): Payer: Medicare Other

## 2018-02-25 ENCOUNTER — Ambulatory Visit: Payer: Medicare Other | Admitting: Physician Assistant

## 2018-02-25 ENCOUNTER — Encounter: Payer: Self-pay | Admitting: Physician Assistant

## 2018-02-25 VITALS — BP 125/79 | HR 56 | Temp 98.4°F | Ht 64.0 in | Wt 216.8 lb

## 2018-02-25 VITALS — BP 118/64 | HR 70 | Temp 98.2°F | Resp 16 | Ht 64.0 in | Wt 217.2 lb

## 2018-02-25 DIAGNOSIS — M8580 Other specified disorders of bone density and structure, unspecified site: Secondary | ICD-10-CM

## 2018-02-25 DIAGNOSIS — Z1231 Encounter for screening mammogram for malignant neoplasm of breast: Secondary | ICD-10-CM | POA: Diagnosis not present

## 2018-02-25 DIAGNOSIS — Z1239 Encounter for other screening for malignant neoplasm of breast: Secondary | ICD-10-CM

## 2018-02-25 DIAGNOSIS — E785 Hyperlipidemia, unspecified: Secondary | ICD-10-CM

## 2018-02-25 DIAGNOSIS — Z0000 Encounter for general adult medical examination without abnormal findings: Secondary | ICD-10-CM

## 2018-02-25 NOTE — Progress Notes (Signed)
Subjective:   Patricia Ferguson is a 67 y.o. female who presents for an Initial Medicare Annual Wellness Visit.  Review of Systems      Cardiac Risk Factors include: advanced age (>59men, >79 women);obesity (BMI >30kg/m2);dyslipidemia     Objective:    Today's Vitals   02/25/18 0816 02/25/18 0820  BP: 118/64   Pulse: 70   Resp: 16   Temp: 98.2 F (36.8 C)   TempSrc: Temporal   Weight: 217 lb 3.2 oz (98.5 kg)   Height: 5\' 4"  (1.626 m)   PainSc:  2    Body mass index is 37.28 kg/m.  Advanced Directives 02/25/2018 02/23/2017  Does Patient Have a Medical Advance Directive? No No  Would patient like information on creating a medical advance directive? Yes (MAU/Ambulatory/Procedural Areas - Information given) No - Patient declined    Current Medications (verified) Outpatient Encounter Medications as of 02/25/2018  Medication Sig  . ezetimibe (ZETIA) 10 MG tablet TAKE 1 TABLET BY MOUTH ONCE DAILY  . Flaxseed, Linseed, (FLAXSEED OIL) 1200 MG CAPS Take 1 capsule by mouth 2 (two) times daily.  . Magnesium 500 MG CAPS Take 1 capsule by mouth 2 (two) times daily.  . Potassium 99 MG TABS Take 99 mg by mouth.  . raloxifene (EVISTA) 60 MG tablet TAKE 1 TABLET BY MOUTH ONCE DAILY  . Red Yeast Rice 600 MG TABS Take 1,200 mg by mouth daily.   No facility-administered encounter medications on file as of 02/25/2018.     Allergies (verified) Lipitor [atorvastatin]; Pravachol [pravastatin sodium]; Propoxyphene; Oxycodone; and Percodan [oxycodone-aspirin]   History: Past Medical History:  Diagnosis Date  . Allergy   . FH: colonic polyps   . Hypercholesterolemia   . Internal hemorrhoids   . Obesity   . Osteopenia   . Walking pneumonia    Past Surgical History:  Procedure Laterality Date  . ABDOMINAL HYSTERECTOMY    . ANAL FISSURE REPAIR    . APPENDECTOMY    . bone spur    . BREAST BIOPSY Right 1980's  . BREAST SURGERY    . CERVICAL CONE BIOPSY    . lumbar fission     Family  History  Problem Relation Age of Onset  . Diabetes Mother   . Hyperlipidemia Mother   . Hypertension Mother   . Osteoporosis Mother   . Cancer Father        lung  . Diabetes Father   . Diabetes Sister   . Hyperlipidemia Sister   . Hypertension Sister   . Hypertension Brother   . Hyperlipidemia Sister   . Breast cancer Neg Hx    Social History   Socioeconomic History  . Marital status: Married    Spouse name: Not on file  . Number of children: Not on file  . Years of education: some college   . Highest education level: High school graduate  Occupational History  . Occupation: [employer] part time   Social Needs  . Financial resource strain: Not hard at all  . Food insecurity:    Worry: Never true    Inability: Never true  . Transportation needs:    Medical: No    Non-medical: No  Tobacco Use  . Smoking status: Never Smoker  . Smokeless tobacco: Never Used  Substance and Sexual Activity  . Alcohol use: Not Currently    Alcohol/week: 0.0 oz    Comment: on occasion  . Drug use: No  . Sexual activity: Never  Lifestyle  .  Physical activity:    Days per week: 0 days    Minutes per session: 0 min  . Stress: Not at all  Relationships  . Social connections:    Talks on phone: More than three times a week    Gets together: More than three times a week    Attends religious service: More than 4 times per year    Active member of club or organization: No    Attends meetings of clubs or organizations: Never    Relationship status: Married  Other Topics Concern  . Not on file  Social History Narrative   Working part time     Tobacco Counseling Counseling given: Not Answered   Clinical Intake:  Pre-visit preparation completed: Yes  Pain : 0-10 Pain Score: 2  Pain Type: Acute pain Pain Location: Back Pain Orientation: Lower Pain Descriptors / Indicators: Aching Pain Onset: In the past 7 days Pain Frequency: Intermittent Effect of Pain on Daily  Activities: pain when standing      Nutritional Status: BMI > 30  Obese Nutritional Risks: None Diabetes: No  How often do you need to have someone help you when you read instructions, pamphlets, or other written materials from your doctor or pharmacy?: 1 - Never What is the last grade level you completed in school?: some college   Interpreter Needed?: No  Information entered by :: Dema Timmons,LPN    Activities of Daily Living In your present state of health, do you have any difficulty performing the following activities: 02/25/2018  Hearing? N  Vision? N  Difficulty concentrating or making decisions? N  Walking or climbing stairs? N  Dressing or bathing? N  Doing errands, shopping? N  Preparing Food and eating ? N  Using the Toilet? N  In the past six months, have you accidently leaked urine? N  Do you have problems with loss of bowel control? N  Managing your Medications? N  Managing your Finances? N  Housekeeping or managing your Housekeeping? N  Some recent data might be hidden     Immunizations and Health Maintenance Immunization History  Administered Date(s) Administered  . Influenza-Unspecified 04/21/2016, 04/16/2017  . Pneumococcal Conjugate-13 08/28/2017  . Td 07/24/2004, 09/17/2009  . Zoster 10/25/2012   Health Maintenance Due  Topic Date Due  . INFLUENZA VACCINE  02/21/2018    Patient Care Team: Kathrine Haddock, NP as PCP - General (Nurse Practitioner)  Indicate any recent Medical Services you may have received from other than [facility] providers in the past year (date may be approximate).     Assessment:   This is a routine wellness examination for Patricia Ferguson.  Hearing/Vision screen Vision Screening Comments: [facility]es [facility]   Dietary issues and exercise activities discussed: Current Exercise Habits: Structured exercise class, Type of exercise: treadmill, Time (Minutes): 30, Frequency (Times/Week): 3, Weekly Exercise (Minutes/Week):  90, Intensity: Mild, Exercise limited by: None identified  Goals    . DIET - INCREASE WATER INTAKE     Recommend drinking at least 6-8 glasses of water a day       Depression Screen PHQ 2/9 Scores 02/25/2018 08/28/2017 02/23/2017 02/21/2016  PHQ - 2 Score 0 0 0 0  PHQ- 9 Score - - 0 -    Fall Risk Fall Risk  02/25/2018 08/28/2017 02/21/2016  Falls in the past year? No No No    Is the patient's home free of loose throw rugs in walkways, pet beds, electrical cords, etc?   yes  Grab bars in the bathroom? yes      Handrails on the stairs?   yes      Adequate lighting?   yes  Timed Get Up and Go Performed Completed in 8 seconds with no use of assistive devices, steady gait. No intervention needed at this time.   Cognitive Function:     6CIT Screen 02/25/2018  What Year? 0 points  What month? 0 points  What time? 0 points  Count back from 20 0 points  Months in reverse 0 points  Repeat phrase 0 points  Total Score 0    Screening Tests Health Maintenance  Topic Date Due  . INFLUENZA VACCINE  02/21/2018  . PNA vac Low Risk Adult (2 of 2 - PPSV23) 08/28/2018  . MAMMOGRAM  04/03/2019  . TETANUS/TDAP  09/18/2019  . COLONOSCOPY  04/11/2022  . DEXA SCAN  Completed  . Hepatitis C Screening  Completed  . HIV Screening  Completed    Qualifies for Shingles Vaccine? Yes, discussed shingrix vaccine  Cancer Screenings: Lung: Low Dose CT Chest recommended if Age 33-80 years, 30 pack-year currently smoking OR have quit w/in 15years. Patient does not qualify. Breast: Up to date on Mammogram? Yes  04/02/2017 Up to date of Bone Density/Dexa? Yes 11/20/2012 Colorectal: completed 04/11/2012  Additional Screenings:  Hepatitis C Screening: completed 02/21/2016     Plan:    I have personally reviewed and addressed the Medicare Annual Wellness questionnaire and have noted the following in the patient's chart:  A. Medical and social history B. Use of alcohol, tobacco or illicit drugs   C. Current medications and supplements D. Functional ability and status E.  Nutritional status F.  Physical activity G. Advance directives H. List of other physicians I.  Hospitalizations, surgeries, and ER visits in previous 12 months J.  Flushing such as hearing and vision if needed, cognitive and depression L. Referrals and appointments   In addition, I have reviewed and discussed with patient certain preventive protocols, quality metrics, and best practice recommendations. A written personalized care plan for preventive services as well as general preventive health recommendations were provided to patient.   Signed,  Tyler Aas, LPN Nurse Health Advisor   Nurse Notes:none

## 2018-02-25 NOTE — Patient Instructions (Signed)
Patricia Ferguson , Thank you for taking time to come for your Medicare Wellness Visit. I appreciate your ongoing commitment to your health goals. Please review the following plan we discussed and let me know if I can assist you in the future.   Screening recommendations/referrals: Colonoscopy: completed 04/11/2012 Mammogram: completed 04/02/2017 Please call [phone] to schedule your mammogram.  Bone Density: completed 11/20/2012 Recommended yearly ophthalmology/optometry visit for glaucoma screening and checkup Recommended yearly dental visit for hygiene and checkup  Vaccinations: Influenza vaccine: due 03/2018 Pneumococcal vaccine: pneumovax 23 due 08/2018 Tdap vaccine: up to date Shingles vaccine: shingrix eligible, check with your insurance company for coverage     Advanced directives: Advance directive discussed with you today. I have provided a copy for you to complete at home and have notarized. Once this is complete please bring a copy in to our office so we can scan it into your chart.  Conditions/risks identified: Recommend drinking at least 6-8 glasses of water a day   Next appointment: Follow up in one year for your annual wellness exam.    Preventive Care 65 Years and Older, Female Preventive care refers to lifestyle choices and visits with your health care provider that can promote health and wellness. What does preventive care include?  A yearly physical exam. This is also called an annual well check.  Dental exams once or twice a year.  Routine eye exams. Ask your health care provider how often you should have your eyes checked.  Personal lifestyle choices, including:  Daily care of your teeth and gums.  Regular physical activity.  Eating a healthy diet.  Avoiding tobacco and drug use.  Limiting alcohol use.  Practicing safe sex.  Taking low-dose aspirin every day.  Taking vitamin and mineral supplements as recommended by your health care provider. What  happens during an annual well check? The services and screenings done by your health care provider during your annual well check will depend on your age, overall health, lifestyle risk factors, and family history of disease. Counseling  Your health care provider may ask you questions about your:  Alcohol use.  Tobacco use.  Drug use.  Emotional well-being.  Home and relationship well-being.  Sexual activity.  Eating habits.  History of falls.  Memory and ability to understand (cognition).  Work and work Statistician.  Reproductive health. Screening  You may have the following tests or measurements:  Height, weight, and BMI.  Blood pressure.  Lipid and cholesterol levels. These may be checked every 5 years, or more frequently if you are over 66 years old.  Skin check.  Lung cancer screening. You may have this screening every year starting at age 18 if you have a 30-pack-year history of smoking and currently smoke or have quit within the past 15 years.  Fecal occult blood test (FOBT) of the stool. You may have this test every year starting at age 52.  Flexible sigmoidoscopy or colonoscopy. You may have a sigmoidoscopy every 5 years or a colonoscopy every 10 years starting at age 23.  Hepatitis C blood test.  Hepatitis B blood test.  Sexually transmitted disease (STD) testing.  Diabetes screening. This is done by checking your blood sugar (glucose) after you have not eaten for a while (fasting). You may have this done every 1-3 years.  Bone density scan. This is done to screen for osteoporosis. You may have this done starting at age 25.  Mammogram. This may be done every 1-2 years. Talk to your health  care provider about how often you should have regular mammograms. Talk with your health care provider about your test results, treatment options, and if necessary, the need for more tests. Vaccines  Your health care provider may recommend certain vaccines, such  as:  Influenza vaccine. This is recommended every year.  Tetanus, diphtheria, and acellular pertussis (Tdap, Td) vaccine. You may need a Td booster every 10 years.  Zoster vaccine. You may need this after age 57.  Pneumococcal 13-valent conjugate (PCV13) vaccine. One dose is recommended after age 44.  Pneumococcal polysaccharide (PPSV23) vaccine. One dose is recommended after age 14. Talk to your health care provider about which screenings and vaccines you need and how often you need them. This information is not intended to replace advice given to you by your health care provider. Make sure you discuss any questions you have with your health care provider. Document Released: 08/06/2015 Document Revised: 03/29/2016 Document Reviewed: 05/11/2015 Elsevier Interactive Patient Education  2017 [place] Prevention in the Home Falls can cause injuries. They can happen to people of all ages. There are many things you can do to make your home safe and to help prevent falls. What can I do on the outside of my home?  Regularly fix the edges of walkways and driveways and fix any cracks.  Remove anything that might make you trip as you walk through a door, such as a raised step or threshold.  Trim any bushes or trees on the path to your home.  Use bright outdoor lighting.  Clear any walking paths of anything that might make someone trip, such as rocks or tools.  Regularly check to see if handrails are loose or broken. Make sure that both sides of any steps have handrails.  Any raised decks and porches should have guardrails on the edges.  Have any leaves, snow, or ice cleared regularly.  Use sand or salt on walking paths during winter.  Clean up any spills in your garage right away. This includes oil or grease spills. What can I do in the bathroom?  Use night lights.  Install grab bars by the toilet and in the tub and shower. Do not use towel bars as grab bars.  Use  non-skid mats or decals in the tub or shower.  If you need to sit down in the shower, use a plastic, non-slip stool.  Keep the floor dry. Clean up any water that spills on the floor as soon as it happens.  Remove soap buildup in the tub or shower regularly.  Attach bath mats securely with double-sided non-slip rug tape.  Do not have throw rugs and other things on the floor that can make you trip. What can I do in the bedroom?  Use night lights.  Make sure that you have a light by your bed that is easy to reach.  Do not use any sheets or blankets that are too big for your bed. They should not hang down onto the floor.  Have a firm chair that has side arms. You can use this for support while you get dressed.  Do not have throw rugs and other things on the floor that can make you trip. What can I do in the kitchen?  Clean up any spills right away.  Avoid walking on wet floors.  Keep items that you use a lot in easy-to-reach places.  If you need to reach something above you, use a strong step stool that has a grab  bar.  Keep electrical cords out of the way.  Do not use floor polish or wax that makes floors slippery. If you must use wax, use non-skid floor wax.  Do not have throw rugs and other things on the floor that can make you trip. What can I do with my stairs?  Do not leave any items on the stairs.  Make sure that there are handrails on both sides of the stairs and use them. Fix handrails that are broken or loose. Make sure that handrails are as long as the stairways.  Check any carpeting to make sure that it is firmly attached to the stairs. Fix any carpet that is loose or worn.  Avoid having throw rugs at the top or bottom of the stairs. If you do have throw rugs, attach them to the floor with carpet tape.  Make sure that you have a light switch at the top of the stairs and the bottom of the stairs. If you do not have them, ask someone to add them for you. What  else can I do to help prevent falls?  Wear shoes that:  Do not have high heels.  Have rubber bottoms.  Are comfortable and fit you well.  Are closed at the toe. Do not wear sandals.  If you use a stepladder:  Make sure that it is fully opened. Do not climb a closed stepladder.  Make sure that both sides of the stepladder are locked into place.  Ask someone to hold it for you, if possible.  Clearly mark and make sure that you can see:  Any grab bars or handrails.  First and last steps.  Where the edge of each step is.  Use tools that help you move around (mobility aids) if they are needed. These include:  Canes.  Walkers.  Scooters.  Crutches.  Turn on the lights when you go into a dark area. Replace any light bulbs as soon as they burn out.  Set up your furniture so you have a clear path. Avoid moving your furniture around.  If any of your floors are uneven, fix them.  If there are any pets around you, be aware of where they are.  Review your medicines with your doctor. Some medicines can make you feel dizzy. This can increase your chance of falling. Ask your doctor what other things that you can do to help prevent falls. This information is not intended to replace advice given to you by your health care provider. Make sure you discuss any questions you have with your health care provider. Document Released: 05/06/2009 Document Revised: 12/16/2015 Document Reviewed: 08/14/2014 Elsevier Interactive Patient Education  2017 [employer].

## 2018-02-25 NOTE — Progress Notes (Signed)
Subjective:    Patient ID: Patricia Ferguson, female    DOB: 01-Dec-1950, 67 y.o.   MRN: [id number]  Patricia Ferguson is a 68 y.o. female presenting on 02/25/2018 for Hyperlipidemia (6 month f/up)   HPI   Here for physical and follow up.   HLD:  Takes zetia. Intolerant to statins. Also takes red yeast rice.   Lipid Panel     Component Value Date/Time   CHOL 203 (H) 02/25/2018 1111   TRIG 139 02/25/2018 1111   HDL 41 02/25/2018 1111   LDLCALC 134 (H) 02/25/2018 1111   Colonoscopy: polyps but possibly not precancerous - 10 years  Mammogram: 03/2017 normal, she would like it every year PAP: s/p hysterectomy with no cervix   Osteopenia: On raloxifene, due for bone density.   Social History   Tobacco Use  . Smoking status: Never Smoker  . Smokeless tobacco: Never Used  Substance Use Topics  . Alcohol use: Not Currently    Alcohol/week: 0.0 oz    Comment: on occasion  . Drug use: No    Review of Systems Per HPI unless specifically indicated above     Objective:    BP 125/79   Pulse (!) 56   Temp 98.4 F (36.9 C) (Oral)   Ht 5' 4"  (1.626 m)   Wt 216 lb 12.8 oz (98.3 kg)   LMP  (LMP Unknown)   SpO2 97%   BMI 37.21 kg/m   Wt Readings from Last 3 Encounters:  02/25/18 216 lb 12.8 oz (98.3 kg)  02/25/18 217 lb 3.2 oz (98.5 kg)  08/28/17 212 lb 14.4 oz (96.6 kg)    Physical Exam  Constitutional: She is oriented to person, place, and time. She appears well-developed and well-nourished.  Cardiovascular: Normal rate and regular rhythm.  Pulmonary/Chest: Effort normal and breath sounds normal.  Neurological: She is alert and oriented to person, place, and time.  Skin: Skin is warm and dry.  Psychiatric: She has a normal mood and affect. Her behavior is normal.   Results for orders placed or performed in visit on 02/25/18  Lipid Panel w/o Chol/HDL Ratio  Result Value Ref Range   Cholesterol, Total 203 (H) 100 - 199 mg/dL   Triglycerides 139 0 - 149 mg/dL   HDL 41 >39  mg/dL   VLDL Cholesterol Cal 28 5 - 40 mg/dL   LDL Calculated 134 (H) 0 - 99 mg/dL  Comp Met (CMET)  Result Value Ref Range   Glucose 86 65 - 99 mg/dL   BUN 11 8 - 27 mg/dL   Creatinine, Ser 0.71 0.57 - 1.00 mg/dL   GFR calc non Af Amer 90 >59 mL/min/1.73   GFR calc Af Amer 103 >59 mL/min/1.73   BUN/Creatinine Ratio 15 12 - 28   Sodium 143 134 - 144 mmol/L   Potassium 3.9 3.5 - 5.2 mmol/L   Chloride 104 96 - 106 mmol/L   CO2 25 20 - 29 mmol/L   Calcium 8.7 8.7 - 10.3 mg/dL   Total Protein 5.9 (L) 6.0 - 8.5 g/dL   Albumin 3.8 3.6 - 4.8 g/dL   Globulin, Total 2.1 1.5 - 4.5 g/dL   Albumin/Globulin Ratio 1.8 1.2 - 2.2   Bilirubin Total 1.0 0.0 - 1.2 mg/dL   Alkaline Phosphatase 49 39 - 117 IU/L   AST 20 0 - 40 IU/L   ALT 16 0 - 32 IU/L      Assessment & Plan:   1. Annual physical  exam   2. Osteopenia, unspecified location  - DG Bone Density; Future  3. Hyperlipidemia, unspecified hyperlipidemia type  Will check labs today.    Follow up plan: Return in about 6 months (around 08/28/2018) for f/u lipids osteopenia.  Carles Collet, PA-C [facility] 02/27/2018, 4:49 PM

## 2018-02-25 NOTE — Patient Instructions (Signed)

## 2018-02-26 LAB — COMPREHENSIVE METABOLIC PANEL
ALT: 16 IU/L (ref 0–32)
AST: 20 IU/L (ref 0–40)
Albumin/Globulin Ratio: 1.8 (ref 1.2–2.2)
Albumin: 3.8 g/dL (ref 3.6–4.8)
Alkaline Phosphatase: 49 IU/L (ref 39–117)
BUN/Creatinine Ratio: 15 (ref 12–28)
BUN: 11 mg/dL (ref 8–27)
Bilirubin Total: 1 mg/dL (ref 0.0–1.2)
CO2: 25 mmol/L (ref 20–29)
Calcium: 8.7 mg/dL (ref 8.7–10.3)
Chloride: 104 mmol/L (ref 96–106)
Creatinine, Ser: 0.71 mg/dL (ref 0.57–1.00)
GFR calc Af Amer: 103 mL/min/{1.73_m2} (ref >59)
GFR calc non Af Amer: 90 mL/min/{1.73_m2} (ref >59)
Globulin, Total: 2.1 g/dL (ref 1.5–4.5)
Glucose: 86 mg/dL (ref 65–99)
Potassium: 3.9 mmol/L (ref 3.5–5.2)
Sodium: 143 mmol/L (ref 134–144)
Total Protein: 5.9 g/dL — ABNORMAL LOW (ref 6.0–8.5)

## 2018-02-26 LAB — LIPID PANEL W/O CHOL/HDL RATIO
Cholesterol, Total: 203 mg/dL — ABNORMAL HIGH (ref 100–199)
HDL: 41 mg/dL (ref >39)
LDL Calculated: 134 mg/dL — ABNORMAL HIGH (ref 0–99)
Triglycerides: 139 mg/dL (ref 0–149)
VLDL Cholesterol Cal: 28 mg/dL (ref 5–40)

## 2018-02-27 ENCOUNTER — Encounter: Payer: Self-pay | Admitting: Physician Assistant

## 2018-07-11 ENCOUNTER — Ambulatory Visit
Admission: RE | Admit: 2018-07-11 | Discharge: 2018-07-11 | Disposition: A | Discharge status: Home / Self Care | Payer: Medicare Other | Source: Ambulatory Visit | Attending: Physician Assistant | Admitting: Physician Assistant

## 2018-07-11 ENCOUNTER — Ambulatory Visit
Admission: RE | Admit: 2018-07-11 | Discharge: 2018-07-11 | Disposition: A | Discharge status: Home / Self Care | Payer: Medicare Other | Source: Ambulatory Visit | Attending: Family Medicine | Admitting: Family Medicine

## 2018-07-11 DIAGNOSIS — M8580 Other specified disorders of bone density and structure, unspecified site: Secondary | ICD-10-CM

## 2018-07-11 DIAGNOSIS — Z1231 Encounter for screening mammogram for malignant neoplasm of breast: Secondary | ICD-10-CM | POA: Diagnosis present

## 2018-07-11 DIAGNOSIS — Z1239 Encounter for other screening for malignant neoplasm of breast: Secondary | ICD-10-CM

## 2018-07-11 DIAGNOSIS — M8589 Other specified disorders of bone density and structure, multiple sites: Secondary | ICD-10-CM | POA: Diagnosis not present

## 2018-07-12 ENCOUNTER — Telehealth: Payer: Self-pay | Admitting: Nurse Practitioner

## 2018-07-12 NOTE — Telephone Encounter (Signed)
Reviewed bone density testing with patient via telephone.  Continue to take Evista.  No recent Vitamin D level noted on chart.  Will obtain this at her next visit in February, along with PTH level.

## 2018-08-28 ENCOUNTER — Other Ambulatory Visit: Payer: Self-pay | Admitting: Unknown Physician Specialty

## 2018-08-28 NOTE — Telephone Encounter (Signed)
Patient has appointment 09/02/18 Requested Prescriptions  Pending Prescriptions Disp Refills  . raloxifene (EVISTA) 60 MG tablet [Pharmacy Med Name: RALOXIFENE HCL 60 MG TAB] 90 tablet 0    Sig: TAKE 1 TABLET BY MOUTH ONCE DAILY     OB/GYN:  Selective Estrogen Receptor Modulators Passed - 08/28/2018  8:35 AM      Passed - Valid encounter within last 12 months    Recent Outpatient Visits          6 months ago Annual physical exam   [facility] Trinna Post, PA-C   1 year ago Need for pneumococcal vaccination   [facility] Kathrine Haddock, NP   1 year ago Initial Medicare annual wellness visit   [facility] Kathrine Haddock, NP   2 years ago Pure hypercholesterolemia   [facility] Kathrine Haddock, NP   2 years ago Health care maintenance   [facility] Kathrine Haddock, NP      Future Appointments            In 5 days Cannady, Barbaraann Faster, NP [facility] [facility]   In 6 months  [employer], [place]

## 2018-09-02 ENCOUNTER — Encounter: Payer: Self-pay | Admitting: Nurse Practitioner

## 2018-09-02 ENCOUNTER — Other Ambulatory Visit: Payer: Self-pay

## 2018-09-02 ENCOUNTER — Ambulatory Visit: Payer: Medicare Other | Admitting: Nurse Practitioner

## 2018-09-02 VITALS — BP 121/80 | HR 71 | Temp 97.8°F | Ht 64.0 in | Wt 212.0 lb

## 2018-09-02 DIAGNOSIS — M79606 Pain in leg, unspecified: Secondary | ICD-10-CM | POA: Insufficient documentation

## 2018-09-02 DIAGNOSIS — M25551 Pain in right hip: Secondary | ICD-10-CM | POA: Diagnosis not present

## 2018-09-02 DIAGNOSIS — E785 Hyperlipidemia, unspecified: Secondary | ICD-10-CM | POA: Diagnosis not present

## 2018-09-02 DIAGNOSIS — G8929 Other chronic pain: Secondary | ICD-10-CM | POA: Insufficient documentation

## 2018-09-02 DIAGNOSIS — M8580 Other specified disorders of bone density and structure, unspecified site: Secondary | ICD-10-CM | POA: Diagnosis not present

## 2018-09-02 MED ORDER — PREDNISONE 10 MG PO TABS: 30.0000 mg | ORAL_TABLET | Freq: Every day | ORAL | 0 refills | Status: AC

## 2018-09-02 NOTE — Patient Instructions (Addendum)
Leg Cramps Leg cramps occur when one or more muscles tighten and you have no control over this tightening (involuntary muscle contraction). Muscle cramps can develop in any muscle, but the most common place is in the calf muscles of the leg. Those cramps can occur during exercise or when you are at rest. Leg cramps are painful, and they may last for a few seconds to a few minutes. Cramps may return several times before they finally stop. Usually, leg cramps are not caused by a serious medical problem. In many cases, the cause is not known. Some common causes include:  Excessive physical effort (overexertion), such as during intense exercise.  Overuse from repetitive motions, or doing the same thing over and over.  Staying in a certain position for a long period of time.  Improper preparation, form, or technique while performing a sport or an activity.  Dehydration.  Injury.  Side effects of certain medicines.  Abnormally low levels of minerals in your blood (electrolytes), especially potassium and calcium. This could result from: ? Pregnancy. ? Taking diuretic medicines. Follow these instructions at home: Eating and drinking  Drink enough fluid to keep your urine pale yellow. Staying hydrated may help prevent cramps.  Eat a healthy diet that includes plenty of nutrients to help your muscles function. A healthy diet includes fruits and vegetables, lean protein, whole grains, and low-fat or nonfat dairy products. Managing pain, stiffness, and swelling      Try massaging, stretching, and relaxing the affected muscle. Do this for several minutes at a time.  If directed, put ice on areas that are sore or painful after a cramp: ? Put ice in a plastic bag. ? Place a towel between your skin and the bag. ? Leave the ice on for 20 minutes, 2-3 times a day.  If directed, apply heat to muscles that are tense or tight. Do this before you exercise, or as often as told by your health care  provider. Use the heat source that your health care provider recommends, such as a moist heat pack or a heating pad. ? Place a towel between your skin and the heat source. ? Leave the heat on for 20-30 minutes. ? Remove the heat if your skin turns bright red. This is especially important if you are unable to feel pain, heat, or cold. You may have a greater risk of getting burned.  Try taking hot showers or baths to help relax tight muscles. General instructions  If you are having frequent leg cramps, avoid intense exercise for several days.  Take over-the-counter and prescription medicines only as told by your health care provider.  Keep all follow-up visits as told by your health care provider. This is important. Contact a health care provider if:  Your leg cramps get more severe or more frequent, or they do not improve over time.  Your foot becomes cold, numb, or blue. Summary  Muscle cramps can develop in any muscle, but the most common place is in the calf muscles of the leg.  Leg cramps are painful, and they may last for a few seconds to a few minutes.  Usually, leg cramps are not caused by a serious medical problem. Often, the cause is not known.  Stay hydrated and take over-the-counter and prescription medicines only as told by your health care provider. This information is not intended to replace advice given to you by your health care provider. Make sure you discuss any questions you have with your health care  provider. Document Released: 08/17/2004 Document Revised: 04/19/2017 Document Reviewed: 04/19/2017 Elsevier Interactive Patient Education  2019 [employer] Rosuvastatin Tablets What is this medicine? ROSUVASTATIN (roe SOO va sta tin) is known as a HMG-CoA reductase inhibitor or 'statin'. It lowers cholesterol and triglycerides in the blood. This drug may also reduce the risk of heart attack, stroke, or other health problems in patients with risk factors for heart  disease. Diet and lifestyle changes are often used with this drug. This medicine may be used for other purposes; ask your health care provider or pharmacist if you have questions. COMMON BRAND NAME(S): Crestor What should I tell my health care provider before I take this medicine? They need to know if you have any of these conditions: -diabetes -if you often drink alcohol -history of stroke -kidney disease -liver disease -muscle aches or weakness -thyroid disease -an unusual or allergic reaction to rosuvastatin, other medicines, foods, dyes, or preservatives -pregnant or trying to get pregnant -breast-feeding How should I use this medicine? Take this medicine by mouth with a glass of water. Follow the directions on the prescription label. Do not cut, crush or chew this medicine. You can take this medicine with or without food. Take your doses at regular intervals. Do not take your medicine more often than directed. Talk to your pediatrician regarding the use of this medicine in children. While this drug may be prescribed for children as young as 73 years old for selected conditions, precautions do apply. Overdosage: If you think you have taken too much of this medicine contact a poison control center or emergency room at once. NOTE: This medicine is only for you. Do not share this medicine with others. What if I miss a dose? If you miss a dose, take it as soon as you can. If your next dose is to be taken in less than 12 hours, then do not take the missed dose. Take the next dose at your regular time. Do not take double or extra doses. What may interact with this medicine? Do not take this medicine with any of the following medications: -herbal medicines like red yeast rice This medicine may also interact with the following medications: -alcohol -antacids containing aluminum hydroxide or magnesium hydroxide -cyclosporine -other medicines for high cholesterol -some medicines for HIV  infection -warfarin This list may not describe all possible interactions. Give your health care provider a list of all the medicines, herbs, non-prescription drugs, or dietary supplements you use. Also tell them if you smoke, drink alcohol, or use illegal drugs. Some items may interact with your medicine. What should I watch for while using this medicine? Visit your doctor or health care professional for regular check-ups. You may need regular tests to make sure your liver is working properly. Your health care professional may tell you to stop taking this medicine if you develop muscle problems. If your muscle problems do not go away after stopping this medicine, contact your health care professional. Do not become pregnant while taking this medicine. Women should inform their health care professional if they wish to become pregnant or think they might be pregnant. There is a potential for serious side effects to an unborn child. Talk to your health care professional or pharmacist for more information. Do not breast-feed an infant while taking this medicine. This medicine may affect blood sugar levels. If you have diabetes, check with your doctor or health care professional before you change your diet or the dose of your diabetic medicine. If you  are going to need surgery or other procedure, tell your doctor that you are using this medicine. This drug is only part of a total heart-health program. Your doctor or a dietician can suggest a low-cholesterol and low-fat diet to help. Avoid alcohol and smoking, and keep a proper exercise schedule. This medicine may cause a decrease in Co-Enzyme Q-10. You should make sure that you get enough Co-Enzyme Q-10 while you are taking this medicine. Discuss the foods you eat and the vitamins you take with your health care professional. What side effects may I notice from receiving this medicine? Side effects that you should report to your doctor or health care  professional as soon as possible: -allergic reactions like skin rash, itching or hives, swelling of the face, lips, or tongue -dark urine -fever -joint pain -muscle cramps, pain -redness, blistering, peeling or loosening of the skin, including inside the mouth -trouble passing urine or change in the amount of urine -unusually weak or tired -yellowing of the eyes or skin Side effects that usually do not require medical attention (report to your doctor or health care professional if they continue or are bothersome): -constipation -heartburn -nausea -stomach gas, pain, upset This list may not describe all possible side effects. Call your doctor for medical advice about side effects. You may report side effects to FDA at 1-800-FDA-1088. Where should I keep my medicine? Keep out of the reach of children. Store at room temperature between 20 and 25 degrees C (68 and 77 degrees F). Keep container tightly closed (protect from moisture). Throw away any unused medicine after the expiration date. NOTE: This sheet is a summary. It may not cover all possible information. If you have questions about this medicine, talk to your doctor, pharmacist, or health care provider.  2019 Elsevier/Gold Standard (2017-03-13 12:42:43)  Pitavastatin oral tablets What is this medicine? PITAVASTATIN (pit A va STAT in) is known as a HMG-CoA reductase inhibitor or 'statin'. It lowers the level of cholesterol and triglycerides in the blood. Diet and lifestyle changes are often used with this drug. This medicine may be used for other purposes; ask your health care provider or pharmacist if you have questions. COMMON BRAND NAME(S): Livalo, Zypitamag What should I tell my health care provider before I take this medicine? They need to know if you have any of these conditions: -diabetes -if you often drink alcohol -history of stroke -kidney disease -liver disease -muscle aches or weakness -thyroid disease -an unusual  or allergic reaction to pitavastatin, other medicines, foods, dyes, or preservatives -pregnant or trying to get pregnant -breast-feeding How should I use this medicine? Take this medicine by mouth with a glass of water. Follow the directions on the prescription label. You can take it with or without food. If it upsets your stomach, take it with food. Take your medicine at regular intervals. Do not take it more often than directed. Do not stop taking except on your doctor's advice. Talk to your pediatrician regarding the use of this medicine in children. While this drug may be prescribed for children as young as 8 years for selected conditions, precautions do apply. Overdosage: If you think you have taken too much of this medicine contact a poison control center or emergency room at once. NOTE: This medicine is only for you. Do not share this medicine with others. What if I miss a dose? If you miss a dose, take it as soon as you can. If it is almost time for your next dose, take  only that dose. Do not take double or extra doses. What may interact with this medicine? Do not take this medicine with any of the following medications: - cyclosporine - gemfibrozil - herbal medicines like red yeast rice This medicine may also interact with the following medications: - alcohol - antiviral medicines for HIV or AIDS - erythromycin - other medicines for cholesterol - rifampin - warfarin This list may not describe all possible interactions. Give your health care provider a list of all the medicines, herbs, non-prescription drugs, or dietary supplements you use. Also tell them if you smoke, drink alcohol, or use illegal drugs. Some items may interact with your medicine. What should I watch for while using this medicine? Visit your doctor or health care professional for regular check-ups. You may need regular tests to make sure your liver is working properly. Your health care professional may tell you to  stop taking this medicine if you develop muscle problems. If your muscle problems do not go away after stopping this medicine, contact your health care professional. Do not become pregnant while taking this medicine. Women should inform their health care professional if they wish to become pregnant or think they might be pregnant. There is a potential for serious side effects to an unborn child. Talk to your health care professional or pharmacist for more information. Do not breast-feed an infant while taking this medicine. This medicine may affect blood sugar levels. If you have diabetes, check with your doctor or health care professional before you change your diet or the dose of your diabetic medicine. If you are going to need surgery or other procedure, tell your doctor that you are using this medicine. This drug is only part of a total heart-health program. Your doctor or a dietician can suggest a low-cholesterol and low-fat diet to help. Avoid alcohol and smoking, and keep a proper exercise schedule. This medicine may cause a decrease in Co-Enzyme Q-10. You should make sure that you get enough Co-Enzyme Q-10 while you are taking this medicine. Discuss the foods you eat and the vitamins you take with your health care professional. What side effects may I notice from receiving this medicine? Side effects that you should report to your doctor or health care professional as soon as possible: - allergic reactions like skin rash, itching or hives, swelling of the face, lips, or tongue - dark urine - fever - joint pain - muscle cramps or pain - redness, blistering, peeling or loosening of the skin, including inside the mouth - trouble passing urine or change in the amount of urine - unusually weak or tired - yellowing of the eyes or skin Side effects that usually do not require medical attention (report to your doctor or health care professional if they continue or are bothersome): - constipation -  heartburn - nausea This list may not describe all possible side effects. Call your doctor for medical advice about side effects. You may report side effects to FDA at 1-800-FDA-1088. Where should I keep my medicine? Keep out of the reach of children. Store at room temperature between 15 and 30 degrees C (59 and 86 degrees F). Protect from light. Throw away any unused medicine after the expiration date. NOTE: This sheet is a summary. It may not cover all possible information. If you have questions about this medicine, talk to your doctor, pharmacist, or health care provider.  2019 Elsevier/Gold Standard (2017-03-13 12:48:36)

## 2018-09-02 NOTE — Assessment & Plan Note (Addendum)
Script for 30 MG Prednisone x 5 days sent for sciatic discomfort.  Recommend Tylenol as needed for discomfort + heat.  Regular stretching at work and home.  If continued discomfort will place PT consult and obtain imaging.  Vitamin D and Mag level today.  Return if continued or worsening.

## 2018-09-02 NOTE — Progress Notes (Signed)
BP 121/80   Pulse 71   Temp 97.8 F (36.6 C) (Oral)   Ht 5' 4" (1.626 m)   Wt 212 lb (96.2 kg)   LMP  (LMP Unknown)   SpO2 98%   BMI 36.39 kg/m    Subjective:    Patient ID: Patricia Ferguson, female    DOB: 1950-11-11, 68 y.o.   MRN: [id number]  HPI: Patricia Ferguson is a 68 y.o. female presents for follow-up  Chief Complaint  Patient presents with  . Hyperlipidemia    f/u  . Leg Pain    x a month. right leg   HYPERLIPIDEMIA Continues on Zetia and Red Yeast Rice daily.  Has tried statin therapy in past and was intolerant, does not recall which ones she took.  We discussed trial of different statin or different statin regimen (3 days a week), which she reports she would try if continued elevation.  Last LDL 134 and TCHOL 203. Hyperlipidemia status: good compliance Satisfied with current treatment?  yes Side effects:  no Medication compliance: good compliance Past cholesterol meds: has tried statins in past and was intolerant to them, muscle pain Supplements: red yeast rice Aspirin:  no The 10-year ASCVD risk score Mikey Bussing DC Jr., et al., 2013) is: 6.3%   Values used to calculate the score:     Age: 33 years     Sex: Female     Is Non-Hispanic African American: No     Diabetic: No     Tobacco smoker: No     Systolic Blood Pressure: 409 mmHg     Is BP treated: No     HDL Cholesterol: 41 mg/dL     Total Cholesterol: 203 mg/dL Chest pain:  no Coronary artery disease:  no Family history CAD:  no Family history early CAD:  no   RIGHT HIP & LEG PAIN: Started about a month ago, while raking leaves. Sleeps on her right side at bedside, when crosses her right leg over left leg has "excruciating pain".  Saw chiropractor and he reported he thought it was sciatic nerve related.  At her job she sits frequently and has been attempting to get up more frequently to stretch, reports this helps.  When first gets up she has to "take it slow".  Has history of lower back surgery in December  2000, at that time she sciatic pain prior to surgery on the same side as current pain.  When she first gets up in morning pain is worse or when first gets out of chair, then once moving it improves.  Chiropractor makes pain better for short period of time.  Has been taking Ibuprofen at home, helps for "a little while".  Describes this as burning sensation from right hip down right leg.  Reports it as a 8/10 if lying on right side, when tries to get up and walk 6-7/10, this then eases off as moves.     Relevant past medical, surgical, family and social history reviewed and updated as indicated. Interim medical history since our last visit reviewed. Allergies and medications reviewed and updated.  Review of Systems  Constitutional: Negative for activity change, appetite change, diaphoresis, fatigue and fever.  Respiratory: Negative for cough, chest tightness and shortness of breath.   Cardiovascular: Negative for chest pain, palpitations and leg swelling.  Gastrointestinal: Negative for abdominal distention, abdominal pain, constipation, diarrhea, nausea and vomiting.  Endocrine: Negative for cold intolerance, heat intolerance, polydipsia, polyphagia and polyuria.  Musculoskeletal: Positive for  arthralgias (right hip).  Neurological: Negative for dizziness, syncope, weakness, light-headedness, numbness and headaches.  Psychiatric/Behavioral: Negative.     Per HPI unless specifically indicated above     Objective:    BP 121/80   Pulse 71   Temp 97.8 F (36.6 C) (Oral)   Ht 5' 4" (1.626 m)   Wt 212 lb (96.2 kg)   LMP  (LMP Unknown)   SpO2 98%   BMI 36.39 kg/m   Wt Readings from Last 3 Encounters:  09/02/18 212 lb (96.2 kg)  02/25/18 216 lb 12.8 oz (98.3 kg)  02/25/18 217 lb 3.2 oz (98.5 kg)    Physical Exam Vitals signs and nursing note reviewed.  Constitutional:      General: She is awake.     Appearance: She is well-developed.  HENT:     Head: Normocephalic.     Right Ear:  Hearing normal.     Left Ear: Hearing normal.     Nose: Nose normal.     Mouth/Throat:     Mouth: Mucous membranes are moist.  Eyes:     General: Lids are normal.        Right eye: No discharge.        Left eye: No discharge.     Conjunctiva/sclera: Conjunctivae normal.     Pupils: Pupils are equal, round, and reactive to light.  Neck:     Musculoskeletal: Normal range of motion and neck supple.     Thyroid: No thyromegaly.     Vascular: No carotid bruit or JVD.  Cardiovascular:     Rate and Rhythm: Normal rate and regular rhythm.     Heart sounds: Normal heart sounds. No murmur. No gallop.   Pulmonary:     Effort: Pulmonary effort is normal.     Breath sounds: Normal breath sounds.  Abdominal:     General: Bowel sounds are normal.     Palpations: Abdomen is soft. There is no hepatomegaly or splenomegaly.  Musculoskeletal:     Right hip: She exhibits normal range of motion, normal strength, no tenderness, no swelling and no crepitus.     Left hip: She exhibits normal range of motion, normal strength, no tenderness, no swelling and no crepitus.     Right lower leg: No edema.     Left lower leg: No edema.     Comments: No discomfort with ROM left hip.  Discomfort noted with ROM right hip on varus and no discomfort with valgus.  No discomfort right hip with flexion or extension.  Lymphadenopathy:     Cervical: No cervical adenopathy.  Skin:    General: Skin is warm and dry.  Neurological:     Mental Status: She is alert and oriented to person, place, and time.  Psychiatric:        Attention and Perception: Attention normal.        Mood and Affect: Mood normal.        Behavior: Behavior normal. Behavior is cooperative.        Thought Content: Thought content normal.        Judgment: Judgment normal.     Results for orders placed or performed in visit on 02/25/18  Lipid Panel w/o Chol/HDL Ratio  Result Value Ref Range   Cholesterol, Total 203 (H) 100 - 199 mg/dL    Triglycerides 139 0 - 149 mg/dL   HDL 41 >39 mg/dL   VLDL Cholesterol Cal 28 5 - 40 mg/dL   LDL Calculated  134 (H) 0 - 99 mg/dL  Comp Met (CMET)  Result Value Ref Range   Glucose 86 65 - 99 mg/dL   BUN 11 8 - 27 mg/dL   Creatinine, Ser 0.71 0.57 - 1.00 mg/dL   GFR calc non Af Amer 90 >59 mL/min/1.73   GFR calc Af Amer 103 >59 mL/min/1.73   BUN/Creatinine Ratio 15 12 - 28   Sodium 143 134 - 144 mmol/L   Potassium 3.9 3.5 - 5.2 mmol/L   Chloride 104 96 - 106 mmol/L   CO2 25 20 - 29 mmol/L   Calcium 8.7 8.7 - 10.3 mg/dL   Total Protein 5.9 (L) 6.0 - 8.5 g/dL   Albumin 3.8 3.6 - 4.8 g/dL   Globulin, Total 2.1 1.5 - 4.5 g/dL   Albumin/Globulin Ratio 1.8 1.2 - 2.2   Bilirubin Total 1.0 0.0 - 1.2 mg/dL   Alkaline Phosphatase 49 39 - 117 IU/L   AST 20 0 - 40 IU/L   ALT 16 0 - 32 IU/L      Assessment & Plan:   Problem List Items Addressed This Visit      Musculoskeletal and Integument   Osteopenia    Chronic, ongoing.  Continue Evista.  Vitamin D level today.        Other   Hyperlipidemia - Primary    Chronic, ongoing with h/o poor tolerance to statin medication.  Continue Zetia and Red Yeast Rice.  Lipid panel and CMP today.  Consider trial of Livola or Rosuvastatin if continued elevation in TCHOL and LDL.        Relevant Orders   Lipid Panel w/o Chol/HDL Ratio   Comprehensive metabolic panel   Acute hip pain, right    Script for 30 MG Prednisone x 5 days sent for sciatic discomfort.  Recommend Tylenol as needed for discomfort + heat.  Regular stretching at work and home.  If continued discomfort will place PT consult and obtain imaging.  Vitamin D and Mag level today.  Return if continued or worsening.      Relevant Orders   CBC with Differential/Platelet   VITAMIN D 25 Hydroxy (Vit-D Deficiency, Fractures)   Magnesium       Follow up plan: Return in about 6 months (around 03/03/2019) for HLD and Osteopenia.

## 2018-09-02 NOTE — Assessment & Plan Note (Signed)
Chronic, ongoing with h/o poor tolerance to statin medication.  Continue Zetia and Red Yeast Rice.  Lipid panel and CMP today.  Consider trial of Livola or Rosuvastatin if continued elevation in TCHOL and LDL.

## 2018-09-02 NOTE — Assessment & Plan Note (Signed)
Chronic, ongoing.  Continue Evista.  Vitamin D level today.

## 2018-09-03 LAB — CBC WITH DIFFERENTIAL/PLATELET
BASOS ABS: 0.1 10*3/uL (ref 0.0–0.2)
Basos: 2 %
EOS (ABSOLUTE): 0.1 10*3/uL (ref 0.0–0.4)
EOS: 2 %
HEMATOCRIT: 38.6 % (ref 34.0–46.6)
HEMOGLOBIN: 12.9 g/dL (ref 11.1–15.9)
IMMATURE GRANS (ABS): 0 10*3/uL (ref 0.0–0.1)
IMMATURE GRANULOCYTES: 0 %
LYMPHS: 35 %
Lymphocytes Absolute: 1.5 10*3/uL (ref 0.7–3.1)
MCH: 30.3 pg (ref 26.6–33.0)
MCHC: 33.4 g/dL (ref 31.5–35.7)
MCV: 91 fL (ref 79–97)
MONOCYTES: 8 %
Monocytes Absolute: 0.3 10*3/uL (ref 0.1–0.9)
Neutrophils Absolute: 2.2 10*3/uL (ref 1.4–7.0)
Neutrophils: 53 %
Platelets: 174 10*3/uL (ref 150–450)
RBC: 4.26 x10E6/uL (ref 3.77–5.28)
RDW: 12.6 % (ref 11.7–15.4)
WBC: 4.2 10*3/uL (ref 3.4–10.8)

## 2018-09-03 LAB — LIPID PANEL W/O CHOL/HDL RATIO
CHOLESTEROL TOTAL: 188 mg/dL (ref 100–199)
HDL: 45 mg/dL (ref >39)
LDL Calculated: 125 mg/dL — ABNORMAL HIGH (ref 0–99)
TRIGLYCERIDES: 90 mg/dL (ref 0–149)
VLDL Cholesterol Cal: 18 mg/dL (ref 5–40)

## 2018-09-03 LAB — MAGNESIUM: MAGNESIUM: 1.8 mg/dL (ref 1.6–2.3)

## 2018-09-03 LAB — COMPREHENSIVE METABOLIC PANEL
A/G RATIO: 2.1 (ref 1.2–2.2)
ALBUMIN: 4.2 g/dL (ref 3.8–4.8)
ALK PHOS: 49 IU/L (ref 39–117)
ALT: 16 IU/L (ref 0–32)
AST: 21 IU/L (ref 0–40)
BILIRUBIN TOTAL: 1.3 mg/dL — AB (ref 0.0–1.2)
BUN / CREAT RATIO: 23 (ref 12–28)
BUN: 15 mg/dL (ref 8–27)
CHLORIDE: 107 mmol/L — AB (ref 96–106)
CO2: 23 mmol/L (ref 20–29)
Calcium: 9.1 mg/dL (ref 8.7–10.3)
Creatinine, Ser: 0.65 mg/dL (ref 0.57–1.00)
GFR calc non Af Amer: 93 mL/min/{1.73_m2} (ref >59)
GFR, EST AFRICAN AMERICAN: 107 mL/min/{1.73_m2} (ref >59)
GLOBULIN, TOTAL: 2 g/dL (ref 1.5–4.5)
Glucose: 81 mg/dL (ref 65–99)
Potassium: 4.2 mmol/L (ref 3.5–5.2)
SODIUM: 140 mmol/L (ref 134–144)
Total Protein: 6.2 g/dL (ref 6.0–8.5)

## 2018-09-03 LAB — VITAMIN D 25 HYDROXY (VIT D DEFICIENCY, FRACTURES): VIT D 25 HYDROXY: 23.2 ng/mL — AB (ref 30.0–100.0)

## 2018-09-07 ENCOUNTER — Other Ambulatory Visit: Payer: Self-pay | Admitting: Family Medicine

## 2018-09-09 NOTE — Telephone Encounter (Signed)
Requested Prescriptions  Pending Prescriptions Disp Refills  . ezetimibe (ZETIA) 10 MG tablet [Pharmacy Med Name: EZETIMIBE 10 MG TAB] 90 tablet 1    Sig: TAKE 1 TABLET BY MOUTH ONCE DAILY     Cardiovascular:  Antilipid - Sterol Transport Inhibitors Failed - 09/07/2018 10:18 AM      Failed - LDL in normal range and within 360 days    LDL Calculated  Date Value Ref Range Status  09/02/2018 125 (H) 0 - 99 mg/dL Final         Passed - Total Cholesterol in normal range and within 360 days    Cholesterol, Total  Date Value Ref Range Status  09/02/2018 188 100 - 199 mg/dL Final         Passed - HDL in normal range and within 360 days    HDL  Date Value Ref Range Status  09/02/2018 45 >39 mg/dL Final         Passed - Triglycerides in normal range and within 360 days    Triglycerides  Date Value Ref Range Status  09/02/2018 90 0 - 149 mg/dL Final         Passed - Valid encounter within last 12 months    Recent Outpatient Visits          1 week ago Hyperlipidemia, unspecified hyperlipidemia type   [place], Barbaraann Faster, NP   6 months ago Annual physical exam   [facility] Trinna Post, PA-C   1 year ago Need for pneumococcal vaccination   [facility] Kathrine Haddock, NP   1 year ago Initial Medicare annual wellness visit   [facility] Kathrine Haddock, NP   2 years ago Pure hypercholesterolemia   [facility] Kathrine Haddock, NP      Future Appointments            In 5 months [facility], [place]

## 2018-11-18 ENCOUNTER — Other Ambulatory Visit: Payer: Self-pay | Admitting: Unknown Physician Specialty

## 2018-11-27 ENCOUNTER — Other Ambulatory Visit: Payer: Self-pay | Admitting: Nurse Practitioner

## 2018-11-27 MED ORDER — PREDNISONE 10 MG PO TABS: ORAL_TABLET | ORAL | 0 refills | Status: DC

## 2018-11-27 NOTE — Progress Notes (Signed)
Patient request to try Prednisone taper again for hip pain, as this worked in February.  Will send in and have patient schedule f/u in 2 weeks to assess.  If ongoing issues will order imaging right hip and back + PT.  Patient agrees with plan of care.

## 2018-12-09 ENCOUNTER — Telehealth: Payer: Self-pay | Admitting: Nurse Practitioner

## 2018-12-09 NOTE — Telephone Encounter (Signed)
Called pt to let her know of Jolene's message, she understood.

## 2018-12-09 NOTE — Telephone Encounter (Signed)
Please let Patricia Ferguson know this is awesome!!  I would like her to follow-up with me IF worsening pain presents, then we may need to discuss  Imaging, physical therapy, and ortho consult.  Thank you!!

## 2018-12-09 NOTE — Telephone Encounter (Signed)
Copied from [place] [id number]. Topic: Quick Communication - See Telephone Encounter >> Dec 09, 2018 10:46 AM Burchel, Abbi R wrote: CRM for notification. See Telephone encounter for: 12/09/18.  Pt would like to let Jolene know that she is almost finished with the prednisone and it was very effective and she is feeling much better.  Pt would like to know if she should schedule f/up visit.  Please call pt to advise.   [phone]

## 2018-12-13 ENCOUNTER — Other Ambulatory Visit: Payer: Self-pay | Admitting: Nurse Practitioner

## 2018-12-13 NOTE — Telephone Encounter (Signed)
Requested Prescriptions  Pending Prescriptions Disp Refills  . ezetimibe (ZETIA) 10 MG tablet [Pharmacy Med Name: EZETIMIBE 10 MG TAB] 90 tablet 1    Sig: TAKE 1 TABLET BY MOUTH ONCE DAILY.     Cardiovascular:  Antilipid - Sterol Transport Inhibitors Failed - 12/13/2018  8:24 AM      Failed - LDL in normal range and within 360 days    LDL Calculated  Date Value Ref Range Status  09/02/2018 125 (H) 0 - 99 mg/dL Final         Passed - Total Cholesterol in normal range and within 360 days    Cholesterol, Total  Date Value Ref Range Status  09/02/2018 188 100 - 199 mg/dL Final         Passed - HDL in normal range and within 360 days    HDL  Date Value Ref Range Status  09/02/2018 45 >39 mg/dL Final         Passed - Triglycerides in normal range and within 360 days    Triglycerides  Date Value Ref Range Status  09/02/2018 90 0 - 149 mg/dL Final         Passed - Valid encounter within last 12 months    Recent Outpatient Visits          3 months ago Hyperlipidemia, unspecified hyperlipidemia type   [place], Barbaraann Faster, NP   9 months ago Annual physical exam   [facility] Trinna Post, PA-C   1 year ago Need for pneumococcal vaccination   [facility] Kathrine Haddock, NP   1 year ago Initial Medicare annual wellness visit   [facility] Kathrine Haddock, NP   2 years ago Pure hypercholesterolemia   [place], [place], NP      Future Appointments            In 2 months  [place], [place]   In 2 months [place], Barbaraann Faster, NP [employer], [place]

## 2019-03-05 ENCOUNTER — Ambulatory Visit (INDEPENDENT_AMBULATORY_CARE_PROVIDER_SITE_OTHER): Payer: Medicare Other | Admitting: Nurse Practitioner

## 2019-03-05 ENCOUNTER — Ambulatory Visit (INDEPENDENT_AMBULATORY_CARE_PROVIDER_SITE_OTHER): Payer: Medicare Other

## 2019-03-05 ENCOUNTER — Encounter: Payer: Self-pay | Admitting: Nurse Practitioner

## 2019-03-05 ENCOUNTER — Other Ambulatory Visit: Payer: Self-pay

## 2019-03-05 VITALS — BP 123/73 | HR 64 | Temp 98.2°F | Ht 64.0 in | Wt 212.0 lb

## 2019-03-05 DIAGNOSIS — E785 Hyperlipidemia, unspecified: Secondary | ICD-10-CM

## 2019-03-05 DIAGNOSIS — E6609 Other obesity due to excess calories: Secondary | ICD-10-CM | POA: Diagnosis not present

## 2019-03-05 DIAGNOSIS — Z0000 Encounter for general adult medical examination without abnormal findings: Secondary | ICD-10-CM

## 2019-03-05 DIAGNOSIS — Z23 Encounter for immunization: Secondary | ICD-10-CM

## 2019-03-05 DIAGNOSIS — M8580 Other specified disorders of bone density and structure, unspecified site: Secondary | ICD-10-CM

## 2019-03-05 DIAGNOSIS — Z1239 Encounter for other screening for malignant neoplasm of breast: Secondary | ICD-10-CM

## 2019-03-05 DIAGNOSIS — Z6836 Body mass index (BMI) 36.0-36.9, adult: Secondary | ICD-10-CM

## 2019-03-05 MED ORDER — SHINGRIX 50 MCG/0.5ML IM SUSR: 0.5000 mL | Freq: Once | INTRAMUSCULAR | 0 refills | Status: AC

## 2019-03-05 NOTE — Assessment & Plan Note (Signed)
Recommend continued focus on health diet choices and regular physical activity (30 minutes 5 days a week). 

## 2019-03-05 NOTE — Assessment & Plan Note (Signed)
Chronic, ongoing with poor statin tolerance in past. Continue current medication regimen, Zetia and Red Yeast Rice.  Labs today.

## 2019-03-05 NOTE — Progress Notes (Signed)
Subjective:   Patricia Ferguson is a 68 y.o. female who presents for Medicare Annual (Subsequent) preventive examination.  This visit is being conducted via phone call  - after an attmept to do on video chat - due to the COVID-19 pandemic. This patient has given me verbal consent via phone to conduct this visit, patient states they are participating from their home address. Some vital signs may be absent or patient reported.   Patient identification: identified by name, DOB, and current address.    Review of Systems:   Cardiac Risk Factors include: advanced age (>29men, >22 women)     Objective:     Vitals: LMP  (LMP Unknown)   There is no height or weight on file to calculate BMI.  Advanced Directives 03/05/2019 02/25/2018 02/23/2017  Does Patient Have a Medical Advance Directive? No No No  Would patient like information on creating a medical advance directive? - Yes (MAU/Ambulatory/Procedural Areas - Information given) No - Patient declined    Tobacco Social History   Tobacco Use  Smoking Status Never Smoker  Smokeless Tobacco Never Used     Counseling given: Not Answered   Clinical Intake:  Pre-visit preparation completed: Yes  Pain : No/denies pain     Nutritional Risks: None Diabetes: No  How often do you need to have someone help you when you read instructions, pamphlets, or other written materials from your doctor or pharmacy?: 1 - Never  Interpreter Needed?: No  Information entered by :: Mikael Debell,LPN  Past Medical History:  Diagnosis Date  . Allergy   . FH: colonic polyps   . Hypercholesterolemia   . Internal hemorrhoids   . Obesity   . Osteopenia   . Walking pneumonia    Past Surgical History:  Procedure Laterality Date  . ABDOMINAL HYSTERECTOMY    . ANAL FISSURE REPAIR    . APPENDECTOMY    . bone spur    . BREAST BIOPSY Right 1980's  . BREAST SURGERY    . CERVICAL CONE BIOPSY    . lumbar fission     Family History  Problem Relation Age  of Onset  . Diabetes Mother   . Hyperlipidemia Mother   . Hypertension Mother   . Osteoporosis Mother   . Cancer Father        lung  . Diabetes Father   . Diabetes Sister   . Hyperlipidemia Sister   . Hypertension Sister   . Hypertension Brother   . Hyperlipidemia Sister   . Breast cancer Neg Hx    Social History   Socioeconomic History  . Marital status: Married    Spouse name: Not on file  . Number of children: Not on file  . Years of education: some college   . Highest education level: High school graduate  Occupational History  . Occupation: [employer] part time   Social Needs  . Financial resource strain: Not hard at all  . Food insecurity    Worry: Never true    Inability: Never true  . Transportation needs    Medical: No    Non-medical: No  Tobacco Use  . Smoking status: Never Smoker  . Smokeless tobacco: Never Used  Substance and Sexual Activity  . Alcohol use: Not Currently    Alcohol/week: 0.0 standard drinks    Comment: on occasion  . Drug use: No  . Sexual activity: Never  Lifestyle  . Physical activity    Days per week: 0 days  Minutes per session: 0 min  . Stress: Not at all  Relationships  . Social connections    Talks on phone: More than three times a week    Gets together: More than three times a week    Attends religious service: More than 4 times per year    Active member of club or organization: No    Attends meetings of clubs or organizations: Never    Relationship status: Married  Other Topics Concern  . Not on file  Social History Narrative   Working part time     Outpatient Encounter Medications as of 03/05/2019  Medication Sig  . cholecalciferol (VITAMIN D3) 25 MCG (1000 UT) tablet Take 5,000 Units by mouth daily.  Marland Kitchen ezetimibe (ZETIA) 10 MG tablet TAKE 1 TABLET BY MOUTH ONCE DAILY.  Marland Kitchen Glucosamine HCl (GLUCOSAMINE PO) Take by mouth daily. 500mg   . Magnesium 500 MG CAPS Take 1 capsule by mouth 2 (two) times daily.  .  raloxifene (EVISTA) 60 MG tablet TAKE 1 TABLET BY MOUTH ONCE DAILY  . Red Yeast Rice 600 MG TABS Take 1,200 mg by mouth daily.  . [DISCONTINUED] predniSONE (DELTASONE) 10 MG tablet Take 6 tablets by mouth daily for 2 days, then reduce by 1 tablet every 2 days until gone (Patient not taking: Reported on 03/05/2019)  . [DISCONTINUED] UNABLE TO FIND daily. Chondraitin 400mg    No facility-administered encounter medications on file as of 03/05/2019.     Activities of Daily Living In your present state of health, do you have any difficulty performing the following activities: 03/05/2019  Hearing? N  Comment no hearing aids  Vision? Y  Comment glasses  Difficulty concentrating or making decisions? N  Walking or climbing stairs? N  Dressing or bathing? N  Doing errands, shopping? N  Preparing Food and eating ? N  Using the Toilet? N  In the past six months, have you accidently leaked urine? Y  Comment wears panty liner for prevention  Do you have problems with loss of bowel control? N  Managing your Medications? N  Managing your Finances? N  Housekeeping or managing your Housekeeping? N  Some recent data might be hidden    Patient Care Team: Venita Lick, NP as PCP - General (Nurse Practitioner)    Assessment:   This is a routine wellness examination for Patricia Ferguson.  Exercise Activities and Dietary recommendations Current Exercise Habits: The patient does not participate in regular exercise at present, Exercise limited by: None identified  Goals    . DIET - INCREASE WATER INTAKE     Recommend drinking at least 6-8 glasses of water a day        Fall Risk: Fall Risk  03/05/2019 02/25/2018 08/28/2017 02/21/2016  Falls in the past year? 0 No No No    FALL RISK PREVENTION PERTAINING TO THE HOME:  Any stairs in or around the home? Yes  If so, are there any without handrails? No   Home free of loose throw rugs in walkways, pet beds, electrical cords, etc? Yes  Adequate lighting in  your home to reduce risk of falls? Yes   ASSISTIVE DEVICES UTILIZED TO PREVENT FALLS:  Life alert? No  Use of a cane, walker or w/c? No  Grab bars in the bathroom? No  Shower chair or bench in shower? No  Elevated toilet seat or a handicapped toilet? No   DME ORDERS:  DME order needed?  No   TIMED UP AND GO:  Unable to  perform   Depression Screen PHQ 2/9 Scores 03/05/2019 02/25/2018 08/28/2017 02/23/2017  PHQ - 2 Score 0 0 0 0  PHQ- 9 Score - - - 0     Cognitive Function     6CIT Screen 02/25/2018  What Year? 0 points  What month? 0 points  What time? 0 points  Count back from 20 0 points  Months in reverse 0 points  Repeat phrase 0 points  Total Score 0    Immunization History  Administered Date(s) Administered  . Influenza-Unspecified 04/21/2016, 04/16/2017, 04/07/2018  . Pneumococcal Conjugate-13 08/28/2017  . Td 07/24/2004, 09/17/2009  . Zoster 10/25/2012    Qualifies for Shingles Vaccine? Shingrix series completed at [facility]   Tdap: up to date   Flu Vaccine: Due 03/2019  Pneumococcal Vaccine: Due for Pneumococcal vaccine. Does the patient want to receive this vaccine today?  Yes  patient to get at office today   Screening Tests Health Maintenance  Topic Date Due  . PNA vac Low Risk Adult (2 of 2 - PPSV23) 08/28/2018  . INFLUENZA VACCINE  02/22/2019  . TETANUS/TDAP  09/18/2019  . MAMMOGRAM  07/11/2020  . COLONOSCOPY  04/11/2022  . DEXA SCAN  Completed  . Hepatitis C Screening  Completed    Cancer Screenings:  Colorectal Screening: Completed 04/11/2012 . Repeat every 10 years  Mammogram: Completed 07/11/2018. Repeat every year;  Bone Density: Completed 07/11/2018.  Lung Cancer Screening: (Low Dose CT Chest recommended if Age 52-80 years, 30 pack-year currently smoking OR have quit w/in 15years.) does not qualify.     Additional Screening:  Hepatitis C Screening: does qualify; Completed 02/21/2016  Vision Screening: Recommended annual  ophthalmology exams for early detection of glaucoma and other disorders of the eye. Is the patient up to date with their annual eye exam?  Yes  Who is the provider or what is the name of the office in which the pt attends annual eye exams? [facility]   Dental Screening: Recommended annual dental exams for proper oral hygiene  Community Resource Referral:  CRR required this visit?  No       Plan:  I have personally reviewed and addressed the Medicare Annual Wellness questionnaire and have noted the following in the patient's chart:  A. Medical and social history B. Use of alcohol, tobacco or illicit drugs  C. Current medications and supplements D. Functional ability and status E.  Nutritional status F.  Physical activity G. Advance directives H. List of other physicians I.  Hospitalizations, surgeries, and ER visits in previous 12 months J.  [place] such as hearing and vision if needed, cognitive and depression L. Referrals and appointments   In addition, I have reviewed and discussed with patient certain preventive protocols, quality metrics, and best practice recommendations. A written personalized care plan for preventive services as well as general preventive health recommendations were provided to patient.  Signed,    Bevelyn Ngo, LPN  [id number] Nurse Health Advisor   Nurse Notes: none

## 2019-03-05 NOTE — Progress Notes (Signed)
BP 123/73   Pulse 64   Temp 98.2 F (36.8 C) (Oral)   Ht 5\' 4"  (1.626 m)   Wt 212 lb (96.2 kg)   LMP  (LMP Unknown)   SpO2 100%   BMI 36.39 kg/m    Subjective:    Patient ID: Patricia Ferguson, female    DOB: [birth year], 68 y.o.   MRN: [id number]  HPI: BASHA KRYGIER is a 68 y.o. female presenting on 03/05/2019 for comprehensive medical examination. Current medical complaints include:none  She currently lives with: husband Menopausal Symptoms: no   HYPERLIPIDEMIA Continues on Zetia and Red Yeast Rice daily.  Has tried statin therapy in past and was intolerant, does not recall which ones she took.  We discussed trial of different statin or different statin regimen (3 days a week), which she reports she would try if continued elevation.   Hyperlipidemia status: good compliance Satisfied with current treatment?  no Side effects:  no Medication compliance: good compliance Past cholesterol meds: atorvastain (lipitor), pravastatin (pravachol), rosuvastatin (crestor) and simvastatin (zocor) Supplements: red yeast rice Aspirin:  no The 10-year ASCVD risk score Mikey Bussing DC Jr., et al., 2013) is: 6%   Values used to calculate the score:     Age: 48 years     Sex: Female     Is Non-Hispanic African American: No     Diabetic: No     Tobacco smoker: No     Systolic Blood Pressure: 696 mmHg     Is BP treated: No     HDL Cholesterol: 45 mg/dL     Total Cholesterol: 188 mg/dL Chest pain:  no Coronary artery disease:  no Family history CAD:  no Family history early CAD:  no  Depression Screen done today and results listed below:  Depression screen [facility] 2/9 03/05/2019 02/25/2018 08/28/2017 02/23/2017 02/21/2016  Decreased Interest 0 0 0 0 0  Down, Depressed, Hopeless 0 0 0 0 0  PHQ - 2 Score 0 0 0 0 0  Altered sleeping - - - 0 -  Tired, decreased energy - - - 0 -  Change in appetite - - - 0 -  Feeling bad or failure about yourself  - - - 0 -  Trouble concentrating - - - 0 -  Moving slowly or  fidgety/restless - - - 0 -  Suicidal thoughts - - - 0 -  PHQ-9 Score - - - 0 -    The patient does not have a history of falls. I did not complete a risk assessment for falls. A plan of care for falls was not documented.   Past Medical History:  Past Medical History:  Diagnosis Date  . Allergy   . FH: colonic polyps   . Hypercholesterolemia   . Internal hemorrhoids   . Obesity   . Osteopenia   . Walking pneumonia     Surgical History:  Past Surgical History:  Procedure Laterality Date  . ABDOMINAL HYSTERECTOMY    . ANAL FISSURE REPAIR    . APPENDECTOMY    . bone spur    . BREAST BIOPSY Right 1980's  . BREAST SURGERY    . CERVICAL CONE BIOPSY    . lumbar fission      Medications:  Current Outpatient Medications on File Prior to Visit  Medication Sig  . cholecalciferol (VITAMIN D3) 25 MCG (1000 UT) tablet Take 5,000 Units by mouth daily.  Marland Kitchen ezetimibe (ZETIA) 10 MG tablet TAKE 1 TABLET BY MOUTH ONCE DAILY.  Marland Kitchen  Glucosamine HCl (GLUCOSAMINE PO) Take by mouth daily. 500mg   . Magnesium 500 MG CAPS Take 1 capsule by mouth 2 (two) times daily.  . raloxifene (EVISTA) 60 MG tablet TAKE 1 TABLET BY MOUTH ONCE DAILY  . Red Yeast Rice 600 MG TABS Take 1,200 mg by mouth daily.   No current facility-administered medications on file prior to visit.     Allergies:  Allergies  Allergen Reactions  . Lipitor [Atorvastatin] Other (See Comments)    Muscle aches Muscle aches  . Pravachol [Pravastatin Sodium] Other (See Comments)    Muscle aches   . Propoxyphene Other (See Comments)    depression  . Oxycodone Rash  . Percodan [Oxycodone-Aspirin] Rash    Social History:  Social History   Socioeconomic History  . Marital status: Married    Spouse name: Not on file  . Number of children: Not on file  . Years of education: some college   . Highest education level: High school graduate  Occupational History  . Occupation: [employer] part time   Social Needs  . Financial  resource strain: Not hard at all  . Food insecurity    Worry: Never true    Inability: Never true  . Transportation needs    Medical: No    Non-medical: No  Tobacco Use  . Smoking status: Never Smoker  . Smokeless tobacco: Never Used  Substance and Sexual Activity  . Alcohol use: Not Currently    Alcohol/week: 0.0 standard drinks    Comment: on occasion  . Drug use: No  . Sexual activity: Never  Lifestyle  . Physical activity    Days per week: 0 days    Minutes per session: 0 min  . Stress: Not at all  Relationships  . Social connections    Talks on phone: More than three times a week    Gets together: More than three times a week    Attends religious service: More than 4 times per year    Active member of club or organization: No    Attends meetings of clubs or organizations: Never    Relationship status: Married  . Intimate partner violence    Fear of current or ex partner: No    Emotionally abused: No    Physically abused: No    Forced sexual activity: No  Other Topics Concern  . Not on file  Social History Narrative   Working part time    Social History   Tobacco Use  Smoking Status Never Smoker  Smokeless Tobacco Never Used   Social History   Substance and Sexual Activity  Alcohol Use Not Currently  . Alcohol/week: 0.0 standard drinks   Comment: on occasion    Family History:  Family History  Problem Relation Age of Onset  . Diabetes Mother   . Hyperlipidemia Mother   . Hypertension Mother   . Osteoporosis Mother   . Cancer Father        lung  . Diabetes Father   . Diabetes Sister   . Hyperlipidemia Sister   . Hypertension Sister   . Hypertension Brother   . Hyperlipidemia Sister   . Breast cancer Neg Hx     Past medical history, surgical history, medications, allergies, family history and social history reviewed with patient today and changes made to appropriate areas of the chart.   Review of Systems - negative All other ROS negative  except what is listed above and in the HPI.  Objective:    BP 123/73   Pulse 64   Temp 98.2 F (36.8 C) (Oral)   Ht 5\' 4"  (1.626 m)   Wt 212 lb (96.2 kg)   LMP  (LMP Unknown)   SpO2 100%   BMI 36.39 kg/m   Wt Readings from Last 3 Encounters:  03/05/19 212 lb (96.2 kg)  09/02/18 212 lb (96.2 kg)  02/25/18 216 lb 12.8 oz (98.3 kg)    Physical Exam Vitals signs and nursing note reviewed.  Constitutional:      General: She is awake. She is not in acute distress.    Appearance: She is well-developed and overweight. She is not ill-appearing.  HENT:     Head: Normocephalic.     Right Ear: Hearing, tympanic membrane, ear canal and external ear normal. No drainage.     Left Ear: Hearing, tympanic membrane, ear canal and external ear normal. No drainage.     Nose: Nose normal.     Mouth/Throat:     Mouth: Mucous membranes are moist.     Pharynx: Oropharynx is clear. Uvula midline.  Eyes:     General: Lids are normal.        Right eye: No discharge.        Left eye: No discharge.     Extraocular Movements: Extraocular movements intact.     Conjunctiva/sclera: Conjunctivae normal.     Pupils: Pupils are equal, round, and reactive to light.     Visual Fields: Right eye visual fields normal and left eye visual fields normal.  Neck:     Musculoskeletal: Normal range of motion and neck supple.     Thyroid: No thyromegaly.     Vascular: No carotid bruit.  Cardiovascular:     Rate and Rhythm: Normal rate and regular rhythm.     Heart sounds: Normal heart sounds. No murmur. No gallop.   Pulmonary:     Effort: Pulmonary effort is normal.     Breath sounds: Normal breath sounds.  Chest:     Comments: Deferred per patient request today. Abdominal:     General: Bowel sounds are normal.     Palpations: Abdomen is soft. There is no hepatomegaly or splenomegaly.     Tenderness: There is no abdominal tenderness.  Musculoskeletal:     Right lower leg: No edema.     Left lower leg:  No edema.  Lymphadenopathy:     Cervical: No cervical adenopathy.  Skin:    General: Skin is warm and dry.  Neurological:     Mental Status: She is alert and oriented to person, place, and time.     Cranial Nerves: Cranial nerves are intact.     Gait: Gait is intact.     Deep Tendon Reflexes: Reflexes are normal and symmetric.     Reflex Scores:      Brachioradialis reflexes are 2+ on the right side and 2+ on the left side.      Patellar reflexes are 2+ on the right side and 2+ on the left side. Psychiatric:        Attention and Perception: Attention normal.        Mood and Affect: Mood normal.        Speech: Speech normal.        Behavior: Behavior normal. Behavior is cooperative.        Thought Content: Thought content normal.        Cognition and Memory: Cognition normal.  Judgment: Judgment normal.     Results for orders placed or performed in visit on 09/02/18  Lipid Panel w/o Chol/HDL Ratio  Result Value Ref Range   Cholesterol, Total 188 100 - 199 mg/dL   Triglycerides 90 0 - 149 mg/dL   HDL 45 >39 mg/dL   VLDL Cholesterol Cal 18 5 - 40 mg/dL   LDL Calculated 125 (H) 0 - 99 mg/dL  Comprehensive metabolic panel  Result Value Ref Range   Glucose 81 65 - 99 mg/dL   BUN 15 8 - 27 mg/dL   Creatinine, Ser 0.65 0.57 - 1.00 mg/dL   GFR calc non Af Amer 93 >59 mL/min/1.73   GFR calc Af Amer 107 >59 mL/min/1.73   BUN/Creatinine Ratio 23 12 - 28   Sodium 140 134 - 144 mmol/L   Potassium 4.2 3.5 - 5.2 mmol/L   Chloride 107 (H) 96 - 106 mmol/L   CO2 23 20 - 29 mmol/L   Calcium 9.1 8.7 - 10.3 mg/dL   Total Protein 6.2 6.0 - 8.5 g/dL   Albumin 4.2 3.8 - 4.8 g/dL   Globulin, Total 2.0 1.5 - 4.5 g/dL   Albumin/Globulin Ratio 2.1 1.2 - 2.2   Bilirubin Total 1.3 (H) 0.0 - 1.2 mg/dL   Alkaline Phosphatase 49 39 - 117 IU/L   AST 21 0 - 40 IU/L   ALT 16 0 - 32 IU/L  CBC with Differential/Platelet  Result Value Ref Range   WBC 4.2 3.4 - 10.8 x10E3/uL   RBC 4.26 3.77 -  5.28 x10E6/uL   Hemoglobin 12.9 11.1 - 15.9 g/dL   Hematocrit 38.6 34.0 - 46.6 %   MCV 91 79 - 97 fL   MCH 30.3 26.6 - 33.0 pg   MCHC 33.4 31.5 - 35.7 g/dL   RDW 12.6 11.7 - 15.4 %   Platelets 174 150 - 450 x10E3/uL   Neutrophils 53 Not Estab. %   Lymphs 35 Not Estab. %   Monocytes 8 Not Estab. %   Eos 2 Not Estab. %   Basos 2 Not Estab. %   Neutrophils Absolute 2.2 1.4 - 7.0 x10E3/uL   Lymphocytes Absolute 1.5 0.7 - 3.1 x10E3/uL   Monocytes Absolute 0.3 0.1 - 0.9 x10E3/uL   EOS (ABSOLUTE) 0.1 0.0 - 0.4 x10E3/uL   Basophils Absolute 0.1 0.0 - 0.2 x10E3/uL   Immature Granulocytes 0 Not Estab. %   Immature Grans (Abs) 0.0 0.0 - 0.1 x10E3/uL  VITAMIN D 25 Hydroxy (Vit-D Deficiency, Fractures)  Result Value Ref Range   Vit D, 25-Hydroxy 23.2 (L) 30.0 - 100.0 ng/mL  Magnesium  Result Value Ref Range   Magnesium 1.8 1.6 - 2.3 mg/dL      Assessment & Plan:   Problem List Items Addressed This Visit      Musculoskeletal and Integument   Osteopenia    Continue Evista and check Vitamin D level.      Relevant Orders   VITAMIN D 25 Hydroxy (Vit-D Deficiency, Fractures)     Other   Hyperlipidemia    Chronic, ongoing with poor statin tolerance in past. Continue current medication regimen, Zetia and Red Yeast Rice.  Labs today.      Relevant Orders   Comprehensive metabolic panel   Lipid Panel w/o Chol/HDL Ratio   Obesity    Recommend continued focus on health diet choices and regular physical activity (30 minutes 5 days a week).       Other Visit Diagnoses    Encounter  for annual physical exam    -  Primary   Relevant Orders   CBC with Differential/Platelet   TSH       Follow up plan: Return in about 6 months (around 09/05/2019) for HLD.   LABORATORY TESTING:  - Pap smear: not applicable  IMMUNIZATIONS:   - Tdap: Tetanus vaccination status reviewed: last tetanus booster within 10 years. - Influenza: Up to date - Pneumovax: Up to date - Prevnar: Up to date -  HPV: Not applicable - Zostavax vaccine: Up to date  SCREENING: -Mammogram: Up to date  - Colonoscopy: Up to date  - Bone Density: Up to date  -Hearing Test: Not applicable  -Spirometry: Not applicable   PATIENT COUNSELING:   Advised to take 1 mg of folate supplement per day if capable of pregnancy.   Sexuality: Discussed sexually transmitted diseases, partner selection, use of condoms, avoidance of unintended pregnancy  and contraceptive alternatives.   Advised to avoid cigarette smoking.  I discussed with the patient that most people either abstain from alcohol or drink within safe limits (<=14/week and <=4 drinks/occasion for males, <=7/weeks and <= 3 drinks/occasion for females) and that the risk for alcohol disorders and other health effects rises proportionally with the number of drinks per week and how often a drinker exceeds daily limits.  Discussed cessation/primary prevention of drug use and availability of treatment for abuse.   Diet: Encouraged to adjust caloric intake to maintain  or achieve ideal body weight, to reduce intake of dietary saturated fat and total fat, to limit sodium intake by avoiding high sodium foods and not adding table salt, and to maintain adequate dietary potassium and calcium preferably from fresh fruits, vegetables, and low-fat dairy products.    stressed the importance of regular exercise  Injury prevention: Discussed safety belts, safety helmets, smoke detector, smoking near bedding or upholstery.   Dental health: Discussed importance of regular tooth brushing, flossing, and dental visits.    NEXT PREVENTATIVE PHYSICAL DUE IN 1 YEAR. Return in about 6 months (around 09/05/2019) for HLD.

## 2019-03-05 NOTE — Patient Instructions (Signed)
Fat and Cholesterol Restricted Eating Plan Getting too much fat and cholesterol in your diet may cause health problems. Choosing the right foods helps keep your fat and cholesterol at normal levels. This can keep you from getting certain diseases. Your doctor may recommend an eating plan that includes:  Total fat: ______% or less of total calories a day.  Saturated fat: ______% or less of total calories a day.  Cholesterol: less than _________mg a day.  Fiber: ______g a day. What are tips for following this plan? Meal planning  At meals, divide your plate into four equal parts: ? Fill one-half of your plate with vegetables and green salads. ? Fill one-fourth of your plate with whole grains. ? Fill one-fourth of your plate with low-fat (lean) protein foods.  Eat fish that is high in omega-3 fats at least two times a week. This includes mackerel, tuna, sardines, and salmon.  Eat foods that are high in fiber, such as whole grains, beans, apples, broccoli, carrots, peas, and barley. General tips   Work with your doctor to lose weight if you need to.  Avoid: ? Foods with added sugar. ? Fried foods. ? Foods with partially hydrogenated oils.  Limit alcohol intake to no more than 1 drink a day for nonpregnant women and 2 drinks a day for men. One drink equals 12 oz of beer, 5 oz of wine, or 1 oz of hard liquor. Reading food labels  Check food labels for: ? Trans fats. ? Partially hydrogenated oils. ? Saturated fat (g) in each serving. ? Cholesterol (mg) in each serving. ? Fiber (g) in each serving.  Choose foods with healthy fats, such as: ? Monounsaturated fats. ? Polyunsaturated fats. ? Omega-3 fats.  Choose grain products that have whole grains. Look for the word "whole" as the first word in the ingredient list. Cooking  Cook foods using low-fat methods. These include baking, boiling, grilling, and broiling.  Eat more home-cooked foods. Eat at restaurants and buffets  less often.  Avoid cooking using saturated fats, such as butter, cream, palm oil, palm kernel oil, and coconut oil. Recommended foods  Fruits  All fresh, canned (in natural juice), or frozen fruits. Vegetables  Fresh or frozen vegetables (raw, steamed, roasted, or grilled). Green salads. Grains  Whole grains, such as whole wheat or whole grain breads, crackers, cereals, and pasta. Unsweetened oatmeal, bulgur, barley, quinoa, or brown rice. Corn or whole wheat flour tortillas. Meats and other protein foods  Ground beef (85% or leaner), grass-fed beef, or beef trimmed of fat. Skinless chicken or turkey. Ground chicken or turkey. Pork trimmed of fat. All fish and seafood. Egg whites. Dried beans, peas, or lentils. Unsalted nuts or seeds. Unsalted canned beans. Nut butters without added sugar or oil. Dairy  Low-fat or nonfat dairy products, such as skim or 1% milk, 2% or reduced-fat cheeses, low-fat and fat-free ricotta or cottage cheese, or plain low-fat and nonfat yogurt. Fats and oils  Tub margarine without trans fats. Light or reduced-fat mayonnaise and salad dressings. Avocado. Olive, canola, sesame, or safflower oils. The items listed above may not be a complete list of foods and beverages you can eat. Contact a dietitian for more information. Foods to avoid Fruits  Canned fruit in heavy syrup. Fruit in cream or butter sauce. Fried fruit. Vegetables  Vegetables cooked in cheese, cream, or butter sauce. Fried vegetables. Grains  White bread. White pasta. White rice. Cornbread. Bagels, pastries, and croissants. Crackers and snack foods that contain trans fat   and hydrogenated oils. Meats and other protein foods  Fatty cuts of meat. Ribs, chicken wings, bacon, sausage, bologna, salami, chitterlings, fatback, hot dogs, bratwurst, and packaged lunch meats. Liver and organ meats. Whole eggs and egg yolks. Chicken and turkey with skin. Fried meat. Dairy  Whole or 2% milk, cream,  half-and-half, and cream cheese. Whole milk cheeses. Whole-fat or sweetened yogurt. Full-fat cheeses. Nondairy creamers and whipped toppings. Processed cheese, cheese spreads, and cheese curds. Beverages  Alcohol. Sugar-sweetened drinks such as sodas, lemonade, and fruit drinks. Fats and oils  Butter, stick margarine, lard, shortening, ghee, or bacon fat. Coconut, palm kernel, and palm oils. Sweets and desserts  Corn syrup, sugars, honey, and molasses. Candy. Jam and jelly. Syrup. Sweetened cereals. Cookies, pies, cakes, donuts, muffins, and ice cream. The items listed above may not be a complete list of foods and beverages you should avoid. Contact a dietitian for more information. Summary  Choosing the right foods helps keep your fat and cholesterol at normal levels. This can keep you from getting certain diseases.  At meals, fill one-half of your plate with vegetables and green salads.  Eat high-fiber foods, like whole grains, beans, apples, carrots, peas, and barley.  Limit added sugar, saturated fats, alcohol, and fried foods. This information is not intended to replace advice given to you by your health care provider. Make sure you discuss any questions you have with your health care provider. Document Released: 01/09/2012 Document Revised: 03/13/2018 Document Reviewed: 03/27/2017 Elsevier Patient Education  2020 [employer]  

## 2019-03-05 NOTE — Assessment & Plan Note (Signed)
Continue Evista and check Vitamin D level.

## 2019-03-05 NOTE — Patient Instructions (Signed)
Patricia Ferguson , Thank you for taking time to come for your Medicare Wellness Visit. I appreciate your ongoing commitment to your health goals. Please review the following plan we discussed and let me know if I can assist you in the future.   Screening recommendations/referrals: Colonoscopy: completed 04/11/2012 Mammogram: completed 07/11/2018 Please call [phone] to schedule your mammogram.  Bone Density: completed 07/11/2018 Recommended yearly ophthalmology/optometry visit for glaucoma screening and checkup Recommended yearly dental visit for hygiene and checkup  Vaccinations: Influenza vaccine: .due 03/2019 Pneumococcal vaccine: to receive at office today Tdap vaccine: up to date Shingles vaccine: shingrix series completed   Advanced directives: please let us know if you have any questions or need assistance filling this paperwork out.  Conditions/risks identified: none  Next appointment: Follow up in one year for your annual wellness visit.    Preventive Care 46 Years and Older, Female Preventive care refers to lifestyle choices and visits with your health care provider that can promote health and wellness. What does preventive care include?  A yearly physical exam. This is also called an annual well check.  Dental exams once or twice a year.  Routine eye exams. Ask your health care provider how often you should have your eyes checked.  Personal lifestyle choices, including:  Daily care of your teeth and gums.  Regular physical activity.  Eating a healthy diet.  Avoiding tobacco and drug use.  Limiting alcohol use.  Practicing safe sex.  Taking low-dose aspirin every day.  Taking vitamin and mineral supplements as recommended by your health care provider. What happens during an annual well check? The services and screenings done by your health care provider during your annual well check will depend on your age, overall health, lifestyle risk factors, and family  history of disease. Counseling  Your health care provider may ask you questions about your:  Alcohol use.  Tobacco use.  Drug use.  Emotional well-being.  Home and relationship well-being.  Sexual activity.  Eating habits.  History of falls.  Memory and ability to understand (cognition).  Work and work Statistician.  Reproductive health. Screening  You may have the following tests or measurements:  Height, weight, and BMI.  Blood pressure.  Lipid and cholesterol levels. These may be checked every 5 years, or more frequently if you are over 60 years old.  Skin check.  Lung cancer screening. You may have this screening every year starting at age 67 if you have a 30-pack-year history of smoking and currently smoke or have quit within the past 15 years.  Fecal occult blood test (FOBT) of the stool. You may have this test every year starting at age 30.  Flexible sigmoidoscopy or colonoscopy. You may have a sigmoidoscopy every 5 years or a colonoscopy every 10 years starting at age 18.  Hepatitis C blood test.  Hepatitis B blood test.  Sexually transmitted disease (STD) testing.  Diabetes screening. This is done by checking your blood sugar (glucose) after you have not eaten for a while (fasting). You may have this done every 1-3 years.  Bone density scan. This is done to screen for osteoporosis. You may have this done starting at age 9.  Mammogram. This may be done every 1-2 years. Talk to your health care provider about how often you should have regular mammograms. Talk with your health care provider about your test results, treatment options, and if necessary, the need for more tests. Vaccines  Your health care provider may recommend certain vaccines, such  as:  Influenza vaccine. This is recommended every year.  Tetanus, diphtheria, and acellular pertussis (Tdap, Td) vaccine. You may need a Td booster every 10 years.  Zoster vaccine. You may need this after  age 45.  Pneumococcal 13-valent conjugate (PCV13) vaccine. One dose is recommended after age 6.  Pneumococcal polysaccharide (PPSV23) vaccine. One dose is recommended after age 45. Talk to your health care provider about which screenings and vaccines you need and how often you need them. This information is not intended to replace advice given to you by your health care provider. Make sure you discuss any questions you have with your health care provider. Document Released: 08/06/2015 Document Revised: 03/29/2016 Document Reviewed: 05/11/2015 Elsevier Interactive Patient Education  2017 [place] Prevention in the Home Falls can cause injuries. They can happen to people of all ages. There are many things you can do to make your home safe and to help prevent falls. What can I do on the outside of my home?  Regularly fix the edges of walkways and driveways and fix any cracks.  Remove anything that might make you trip as you walk through a door, such as a raised step or threshold.  Trim any bushes or trees on the path to your home.  Use bright outdoor lighting.  Clear any walking paths of anything that might make someone trip, such as rocks or tools.  Regularly check to see if handrails are loose or broken. Make sure that both sides of any steps have handrails.  Any raised decks and porches should have guardrails on the edges.  Have any leaves, snow, or ice cleared regularly.  Use sand or salt on walking paths during winter.  Clean up any spills in your garage right away. This includes oil or grease spills. What can I do in the bathroom?  Use night lights.  Install grab bars by the toilet and in the tub and shower. Do not use towel bars as grab bars.  Use non-skid mats or decals in the tub or shower.  If you need to sit down in the shower, use a plastic, non-slip stool.  Keep the floor dry. Clean up any water that spills on the floor as soon as it happens.   Remove soap buildup in the tub or shower regularly.  Attach bath mats securely with double-sided non-slip rug tape.  Do not have throw rugs and other things on the floor that can make you trip. What can I do in the bedroom?  Use night lights.  Make sure that you have a light by your bed that is easy to reach.  Do not use any sheets or blankets that are too big for your bed. They should not hang down onto the floor.  Have a firm chair that has side arms. You can use this for support while you get dressed.  Do not have throw rugs and other things on the floor that can make you trip. What can I do in the kitchen?  Clean up any spills right away.  Avoid walking on wet floors.  Keep items that you use a lot in easy-to-reach places.  If you need to reach something above you, use a strong step stool that has a grab bar.  Keep electrical cords out of the way.  Do not use floor polish or wax that makes floors slippery. If you must use wax, use non-skid floor wax.  Do not have throw rugs and other things on the  floor that can make you trip. What can I do with my stairs?  Do not leave any items on the stairs.  Make sure that there are handrails on both sides of the stairs and use them. Fix handrails that are broken or loose. Make sure that handrails are as long as the stairways.  Check any carpeting to make sure that it is firmly attached to the stairs. Fix any carpet that is loose or worn.  Avoid having throw rugs at the top or bottom of the stairs. If you do have throw rugs, attach them to the floor with carpet tape.  Make sure that you have a light switch at the top of the stairs and the bottom of the stairs. If you do not have them, ask someone to add them for you. What else can I do to help prevent falls?  Wear shoes that:  Do not have high heels.  Have rubber bottoms.  Are comfortable and fit you well.  Are closed at the toe. Do not wear sandals.  If you use a  stepladder:  Make sure that it is fully opened. Do not climb a closed stepladder.  Make sure that both sides of the stepladder are locked into place.  Ask someone to hold it for you, if possible.  Clearly mark and make sure that you can see:  Any grab bars or handrails.  First and last steps.  Where the edge of each step is.  Use tools that help you move around (mobility aids) if they are needed. These include:  Canes.  Walkers.  Scooters.  Crutches.  Turn on the lights when you go into a dark area. Replace any light bulbs as soon as they burn out.  Set up your furniture so you have a clear path. Avoid moving your furniture around.  If any of your floors are uneven, fix them.  If there are any pets around you, be aware of where they are.  Review your medicines with your doctor. Some medicines can make you feel dizzy. This can increase your chance of falling. Ask your doctor what other things that you can do to help prevent falls. This information is not intended to replace advice given to you by your health care provider. Make sure you discuss any questions you have with your health care provider. Document Released: 05/06/2009 Document Revised: 12/16/2015 Document Reviewed: 08/14/2014 Elsevier Interactive Patient Education  2017 [employer].

## 2019-03-06 LAB — CBC WITH DIFFERENTIAL/PLATELET
Basophils Absolute: 0.1 10*3/uL (ref 0.0–0.2)
Basos: 2 %
EOS (ABSOLUTE): 0.1 10*3/uL (ref 0.0–0.4)
Eos: 2 %
Hematocrit: 36.4 % (ref 34.0–46.6)
Hemoglobin: 12.4 g/dL (ref 11.1–15.9)
Immature Grans (Abs): 0 10*3/uL (ref 0.0–0.1)
Immature Granulocytes: 0 %
Lymphocytes Absolute: 1.8 10*3/uL (ref 0.7–3.1)
Lymphs: 37 %
MCH: 30 pg (ref 26.6–33.0)
MCHC: 34.1 g/dL (ref 31.5–35.7)
MCV: 88 fL (ref 79–97)
Monocytes Absolute: 0.4 10*3/uL (ref 0.1–0.9)
Monocytes: 9 %
Neutrophils Absolute: 2.5 10*3/uL (ref 1.4–7.0)
Neutrophils: 50 %
Platelets: 158 10*3/uL (ref 150–450)
RBC: 4.13 x10E6/uL (ref 3.77–5.28)
RDW: 12.6 % (ref 11.7–15.4)
WBC: 4.9 10*3/uL (ref 3.4–10.8)

## 2019-03-06 LAB — COMPREHENSIVE METABOLIC PANEL
ALT: 14 IU/L (ref 0–32)
AST: 21 IU/L (ref 0–40)
Albumin/Globulin Ratio: 2 (ref 1.2–2.2)
Albumin: 4 g/dL (ref 3.8–4.8)
Alkaline Phosphatase: 46 IU/L (ref 39–117)
BUN/Creatinine Ratio: 20 (ref 12–28)
BUN: 15 mg/dL (ref 8–27)
Bilirubin Total: 1.1 mg/dL (ref 0.0–1.2)
CO2: 26 mmol/L (ref 20–29)
Calcium: 8.9 mg/dL (ref 8.7–10.3)
Chloride: 102 mmol/L (ref 96–106)
Creatinine, Ser: 0.75 mg/dL (ref 0.57–1.00)
GFR calc Af Amer: 96 mL/min/{1.73_m2} (ref >59)
GFR calc non Af Amer: 83 mL/min/{1.73_m2} (ref >59)
Globulin, Total: 2 g/dL (ref 1.5–4.5)
Glucose: 86 mg/dL (ref 65–99)
Potassium: 4.2 mmol/L (ref 3.5–5.2)
Sodium: 140 mmol/L (ref 134–144)
Total Protein: 6 g/dL (ref 6.0–8.5)

## 2019-03-06 LAB — LIPID PANEL W/O CHOL/HDL RATIO
Cholesterol, Total: 196 mg/dL (ref 100–199)
HDL: 46 mg/dL (ref >39)
LDL Calculated: 130 mg/dL — ABNORMAL HIGH (ref 0–99)
Triglycerides: 101 mg/dL (ref 0–149)
VLDL Cholesterol Cal: 20 mg/dL (ref 5–40)

## 2019-03-06 LAB — VITAMIN D 25 HYDROXY (VIT D DEFICIENCY, FRACTURES): Vit D, 25-Hydroxy: 23.9 ng/mL — ABNORMAL LOW (ref 30.0–100.0)

## 2019-03-06 LAB — TSH: TSH: 3.32 u[IU]/mL (ref 0.450–4.500)

## 2019-03-08 ENCOUNTER — Other Ambulatory Visit: Payer: Self-pay | Admitting: Nurse Practitioner

## 2019-03-11 ENCOUNTER — Other Ambulatory Visit: Payer: Self-pay | Admitting: Nurse Practitioner

## 2019-03-11 NOTE — Telephone Encounter (Signed)
Re-sent rx due to issue below   raloxifene (EVISTA) 60 MG tablet 90 tablet 0 03/08/2019    Sig: TAKE 1 TABLET BY MOUTH ONCE DAILY   Sent to pharmacy as: raloxifene (EVISTA) 60 MG tablet   E-Prescribing Status: Transmission to pharmacy failed (03/08/2019 7:26 PM EDT)   Message completed by Tiffany L. Pearson (03/10/2019 9:20 AM).

## 2019-06-21 ENCOUNTER — Other Ambulatory Visit: Payer: Self-pay | Admitting: Nurse Practitioner

## 2019-07-15 ENCOUNTER — Ambulatory Visit
Admission: RE | Admit: 2019-07-15 | Discharge: 2019-07-15 | Disposition: A | Discharge status: Home / Self Care | Payer: Medicare Other | Source: Ambulatory Visit | Attending: Nurse Practitioner | Admitting: Nurse Practitioner

## 2019-07-15 DIAGNOSIS — Z1239 Encounter for other screening for malignant neoplasm of breast: Secondary | ICD-10-CM

## 2019-07-15 DIAGNOSIS — Z1231 Encounter for screening mammogram for malignant neoplasm of breast: Secondary | ICD-10-CM | POA: Diagnosis not present

## 2019-09-08 ENCOUNTER — Ambulatory Visit: Payer: Medicare Other | Admitting: Nurse Practitioner

## 2019-09-27 ENCOUNTER — Other Ambulatory Visit: Payer: Self-pay | Admitting: Nurse Practitioner

## 2019-09-27 NOTE — Telephone Encounter (Signed)
Requested Prescriptions  Pending Prescriptions Disp Refills  . raloxifene (EVISTA) 60 MG tablet [Pharmacy Med Name: RALOXIFENE HCL 60 MG TAB] 90 tablet 0    Sig: TAKE 1 TABLET BY MOUTH ONCE DAILY     OB/GYN:  Selective Estrogen Receptor Modulators Passed - 09/27/2019  9:28 AM      Passed - Valid encounter within last 12 months    Recent Outpatient Visits          6 months ago Encounter for annual physical exam   [facility] [place], Henrine Screws T, NP   1 year ago Hyperlipidemia, unspecified hyperlipidemia type   [facility] [place], Barbaraann Faster, NP   1 year ago Annual physical exam   [facility] Trinna Post, PA-C   2 years ago Need for pneumococcal vaccination   [facility] Kathrine Haddock, NP   2 years ago Initial Medicare annual wellness visit   [facility] Kathrine Haddock, NP      Future Appointments            In 6 days [place], Barbaraann Faster, NP [employer], [place]

## 2019-09-29 ENCOUNTER — Ambulatory Visit: Payer: Medicare Other | Admitting: Nurse Practitioner

## 2019-10-03 ENCOUNTER — Ambulatory Visit: Payer: Medicare Other | Admitting: Nurse Practitioner

## 2019-10-03 ENCOUNTER — Telehealth (INDEPENDENT_AMBULATORY_CARE_PROVIDER_SITE_OTHER): Payer: Medicare PPO | Admitting: Nurse Practitioner

## 2019-10-03 ENCOUNTER — Encounter: Payer: Self-pay | Admitting: Nurse Practitioner

## 2019-10-03 DIAGNOSIS — R12 Heartburn: Secondary | ICD-10-CM | POA: Diagnosis not present

## 2019-10-03 DIAGNOSIS — E782 Mixed hyperlipidemia: Secondary | ICD-10-CM

## 2019-10-03 MED ORDER — RALOXIFENE HCL 60 MG PO TABS: 60.0000 mg | ORAL_TABLET | Freq: Every day | ORAL | 3 refills | Status: DC

## 2019-10-03 MED ORDER — EZETIMIBE 10 MG PO TABS: 10.0000 mg | ORAL_TABLET | Freq: Every day | ORAL | 3 refills | Status: DC

## 2019-10-03 MED ORDER — FAMOTIDINE 20 MG PO TABS: 20.0000 mg | ORAL_TABLET | Freq: Every day | ORAL | 3 refills | Status: DC

## 2019-10-03 NOTE — Progress Notes (Signed)
LMP  (LMP Unknown)    Subjective:    Patient ID: Patricia Ferguson, female    DOB: 1951-06-08, 69 y.o.   MRN: [id number]  HPI: Patricia Ferguson is a 69 y.o. female  Chief Complaint  Patient presents with  . Hyperlipidemia    . This visit was completed via MyChart due to the restrictions of the COVID-19 pandemic. All issues as above were discussed and addressed. Physical exam was done as above through visual confirmation on MyChart. If it was felt that the patient should be evaluated in the office, they were directed there. The patient verbally consented to this visit. . Location of the patient: home . Location of the provider: work . Those involved with this call:  . Provider: Marnee Guarneri, DNP . CMA: Yvonna Alanis, CMA . Front Desk/Registration: Don Perking  . Time spent on call: 15 minutes with patient face to face via video conference. More than 50% of this time was spent in counseling and coordination of care. 10 minutes total spent in review of patient's record and preparation of their chart.  . I verified patient identity using two factors (patient name and date of birth). Patient consents verbally to being seen via telemedicine visit today.    HYPERLIPIDEMIA Continues on Zetia and Metamucil daily -- started 1 1/2 months ago. Has tried statin therapy in past and was intolerant, does not recall which ones she took. We discussed trial of different statin or different statin regimen (3 days a week), which she reports she would try if continued elevation.  Hyperlipidemia status: good compliance Satisfied with current treatment?  yes Side effects:  no Medication compliance: good compliance Past cholesterol meds:  atorvastain (lipitor), pravastatin (pravachol), rosuvastatin (crestor) and simvastatin (zocor) Supplements: none Aspirin:  no The 10-year ASCVD risk score Mikey Bussing DC Jr., et al., 2013) is: 6.7%   Values used to calculate the score:     Age: 35 years     Sex:  Female     Is Non-Hispanic African American: No     Diabetic: No     Tobacco smoker: No     Systolic Blood Pressure: [id number] mmHg     Is BP treated: No     HDL Cholesterol: 46 mg/dL     Total Cholesterol: 196 mg/dL Chest pain:  no Coronary artery disease:  no Family history CAD:  no Family history early CAD:  no   GERD Recently having more reflux, started getting more prevalent since before Christmas.  Has been watching diet and changing this.  Taking Alka Seltzer Plus, this helps a little bit.  Used to be at night and now notices in morning over past couple weeks.   GERD control status: uncontrolled  Satisfied with current treatment? no Heartburn frequency: daily Previous GERD medications: Alka Seltzer Antacid use frequency:  daily Duration: lasts for 2 hours at times Petra Kuba: burning Location:  epigastric Alleviatiating factors:  Alka Seltzer Aggravating factors: fresh onions, [place] food, salads Dysphagia: no Odynophagia:  no Hematemesis: no Blood in stool: no EGD: no  Relevant past medical, surgical, family and social history reviewed and updated as indicated. Interim medical history since our last visit reviewed. Allergies and medications reviewed and updated.  Review of Systems  Constitutional: Negative for activity change, appetite change, diaphoresis, fatigue and fever.  Respiratory: Negative for cough, chest tightness and shortness of breath.   Cardiovascular: Negative for chest pain, palpitations and leg swelling.  Gastrointestinal: Negative for abdominal distention, abdominal pain, blood in  stool, constipation, diarrhea, nausea and vomiting.  Neurological: Negative.   Psychiatric/Behavioral: Negative.     Per HPI unless specifically indicated above     Objective:    LMP  (LMP Unknown)   Wt Readings from Last 3 Encounters:  03/05/19 212 lb (96.2 kg)  09/02/18 212 lb (96.2 kg)  02/25/18 216 lb 12.8 oz (98.3 kg)    Physical Exam Vitals and nursing note  reviewed.  Constitutional:      General: She is awake. She is not in acute distress.    Appearance: She is well-developed. She is not ill-appearing.  HENT:     Head: Normocephalic.     Right Ear: Hearing normal.     Left Ear: Hearing normal.  Eyes:     General: Lids are normal.        Right eye: No discharge.        Left eye: No discharge.     Conjunctiva/sclera: Conjunctivae normal.  Pulmonary:     Effort: Pulmonary effort is normal. No accessory muscle usage or respiratory distress.  Musculoskeletal:     Cervical back: Normal range of motion.  Neurological:     Mental Status: She is alert and oriented to person, place, and time.  Psychiatric:        Attention and Perception: Attention normal.        Mood and Affect: Mood normal.        Behavior: Behavior normal. Behavior is cooperative.        Thought Content: Thought content normal.        Judgment: Judgment normal.     Results for orders placed or performed in visit on 03/05/19  CBC with Differential/Platelet  Result Value Ref Range   WBC 4.9 3.4 - 10.8 x10E3/uL   RBC 4.13 3.77 - 5.28 x10E6/uL   Hemoglobin 12.4 11.1 - 15.9 g/dL   Hematocrit 36.4 34.0 - 46.6 %   MCV 88 79 - 97 fL   MCH 30.0 26.6 - 33.0 pg   MCHC 34.1 31.5 - 35.7 g/dL   RDW 12.6 11.7 - 15.4 %   Platelets 158 150 - 450 x10E3/uL   Neutrophils 50 Not Estab. %   Lymphs 37 Not Estab. %   Monocytes 9 Not Estab. %   Eos 2 Not Estab. %   Basos 2 Not Estab. %   Neutrophils Absolute 2.5 1.4 - 7.0 x10E3/uL   Lymphocytes Absolute 1.8 0.7 - 3.1 x10E3/uL   Monocytes Absolute 0.4 0.1 - 0.9 x10E3/uL   EOS (ABSOLUTE) 0.1 0.0 - 0.4 x10E3/uL   Basophils Absolute 0.1 0.0 - 0.2 x10E3/uL   Immature Granulocytes 0 Not Estab. %   Immature Grans (Abs) 0.0 0.0 - 0.1 x10E3/uL  Comprehensive metabolic panel  Result Value Ref Range   Glucose 86 65 - 99 mg/dL   BUN 15 8 - 27 mg/dL   Creatinine, Ser 0.75 0.57 - 1.00 mg/dL   GFR calc non Af Amer 83 >59 mL/min/1.73   GFR  calc Af Amer 96 >59 mL/min/1.73   BUN/Creatinine Ratio 20 12 - 28   Sodium 140 134 - 144 mmol/L   Potassium 4.2 3.5 - 5.2 mmol/L   Chloride 102 96 - 106 mmol/L   CO2 26 20 - 29 mmol/L   Calcium 8.9 8.7 - 10.3 mg/dL   Total Protein 6.0 6.0 - 8.5 g/dL   Albumin 4.0 3.8 - 4.8 g/dL   Globulin, Total 2.0 1.5 - 4.5 g/dL   Albumin/Globulin Ratio 2.0  1.2 - 2.2   Bilirubin Total 1.1 0.0 - 1.2 mg/dL   Alkaline Phosphatase 46 39 - 117 IU/L   AST 21 0 - 40 IU/L   ALT 14 0 - 32 IU/L  Lipid Panel w/o Chol/HDL Ratio  Result Value Ref Range   Cholesterol, Total 196 100 - 199 mg/dL   Triglycerides 101 0 - 149 mg/dL   HDL 46 >39 mg/dL   VLDL Cholesterol Cal 20 5 - 40 mg/dL   LDL Calculated 130 (H) 0 - 99 mg/dL  TSH  Result Value Ref Range   TSH 3.320 0.450 - 4.500 uIU/mL  VITAMIN D 25 Hydroxy (Vit-D Deficiency, Fractures)  Result Value Ref Range   Vit D, 25-Hydroxy 23.9 (L) 30.0 - 100.0 ng/mL      Assessment & Plan:   Problem List Items Addressed This Visit      Other   Hyperlipidemia    Chronic, ongoing with poor statin tolerance in past. Continue current medication regimen, Zetia and Metamucil.  Labs in 4 weeks at face to face visit.      Relevant Medications   ezetimibe (ZETIA) 10 MG tablet   Heart burn    New with worsening over past months.  Will trial Pepcid 20 MG daily, script sent.  Recommend keeping food journal and avoiding foods that trigger reflux.  Will follow-up with her in 4 weeks.         I discussed the assessment and treatment plan with the patient. The patient was provided an opportunity to ask questions and all were answered. The patient agreed with the plan and demonstrated an understanding of the instructions.   The patient was advised to call back or seek an in-person evaluation if the symptoms worsen or if the condition fails to improve as anticipated.   I provided 15+ minutes of time during this encounter.  Follow up plan: Return in about 4 weeks (around  10/31/2019) for GERD and hand assessment.

## 2019-10-03 NOTE — Assessment & Plan Note (Signed)
Chronic, ongoing with poor statin tolerance in past. Continue current medication regimen, Zetia and Metamucil.  Labs in 4 weeks at face to face visit.

## 2019-10-03 NOTE — Assessment & Plan Note (Signed)
New with worsening over past months.  Will trial Pepcid 20 MG daily, script sent.  Recommend keeping food journal and avoiding foods that trigger reflux.  Will follow-up with her in 4 weeks.

## 2019-10-03 NOTE — Patient Instructions (Signed)

## 2019-11-04 ENCOUNTER — Ambulatory Visit (INDEPENDENT_AMBULATORY_CARE_PROVIDER_SITE_OTHER): Payer: Medicare PPO | Admitting: Nurse Practitioner

## 2019-11-04 ENCOUNTER — Other Ambulatory Visit: Payer: Self-pay

## 2019-11-04 ENCOUNTER — Encounter: Payer: Self-pay | Admitting: Nurse Practitioner

## 2019-11-04 VITALS — BP 113/76 | HR 73 | Temp 98.1°F | Wt 216.0 lb

## 2019-11-04 DIAGNOSIS — R12 Heartburn: Secondary | ICD-10-CM | POA: Diagnosis not present

## 2019-11-04 DIAGNOSIS — E782 Mixed hyperlipidemia: Secondary | ICD-10-CM | POA: Diagnosis not present

## 2019-11-04 DIAGNOSIS — E6609 Other obesity due to excess calories: Secondary | ICD-10-CM | POA: Diagnosis not present

## 2019-11-04 DIAGNOSIS — Z6836 Body mass index (BMI) 36.0-36.9, adult: Secondary | ICD-10-CM

## 2019-11-04 DIAGNOSIS — M79602 Pain in left arm: Secondary | ICD-10-CM

## 2019-11-04 NOTE — Assessment & Plan Note (Signed)
Recommended eating smaller high protein, low fat meals more frequently and exercising 30 mins a day 5 times a week with a goal of 10-15lb weight loss in the next 3 months. Patient voiced their understanding and motivation to adhere to these recommendations.  

## 2019-11-04 NOTE — Patient Instructions (Signed)

## 2019-11-04 NOTE — Progress Notes (Signed)
BP 113/76   Pulse 73   Temp 98.1 F (36.7 C) (Oral)   Wt 216 lb (98 kg)   LMP  (LMP Unknown)   BMI 37.08 kg/m    Subjective:    Patient ID: Patricia Ferguson, female    DOB: 28-Jul-1950, 69 y.o.   MRN: [id number]  HPI: MARCEDES Ferguson is a 69 y.o. female  Chief Complaint  Patient presents with  . Gastroesophageal Reflux  . Hyperlipidemia   GERD Started on Pepcid 20 MG daily last visit and this has offered benefit. GERD control status: stable  Satisfied with current treatment? yes Heartburn frequency: none Medication side effects: no  Medication compliance: stable Previous GERD medications: Pepcid Antacid use frequency:  none Dysphagia: no Odynophagia:  no Hematemesis: no Blood in stool: no EGD: no  HYPERLIPIDEMIA Continues on Zetia.   Hyperlipidemia status: good compliance Satisfied with current treatment?  yes Side effects:  no Medication compliance: good compliance Past cholesterol meds: atorvastain (lipitor) and ezetimide (zetia) Supplements: none Aspirin:  no The 10-year ASCVD risk score Mikey Bussing DC Jr., et al., 2013) is: 5.7%   Values used to calculate the score:     Age: 69 years     Sex: Female     Is Non-Hispanic African American: No     Diabetic: No     Tobacco smoker: No     Systolic Blood Pressure: 123456 mmHg     Is BP treated: No     HDL Cholesterol: 46 mg/dL     Total Cholesterol: 196 mg/dL Chest pain:  no Coronary artery disease:  no Family history CAD:  no Family history early CAD:  no   AREA TO ARM: Has thrombosed vein to left antecubital.  Has been present for years, but over past 6 months has increased in size and become tender.  She reports area gets bigger at night.  Denies any warmth, redness.  Reports discomfort moving elbow up and down due to pressure from area.  Denies decreased ROM, SOB, CP, or cough.  She denies past trauma to area.  Relevant past medical, surgical, family and social history reviewed and updated as indicated. Interim  medical history since our last visit reviewed. Allergies and medications reviewed and updated.  Review of Systems  Constitutional: Negative for activity change, appetite change, diaphoresis, fatigue and fever.  Respiratory: Negative for cough, chest tightness and shortness of breath.   Cardiovascular: Negative for chest pain, palpitations and leg swelling.  Gastrointestinal: Negative.   Neurological: Negative.   Psychiatric/Behavioral: Negative.     Per HPI unless specifically indicated above     Objective:    BP 113/76   Pulse 73   Temp 98.1 F (36.7 C) (Oral)   Wt 216 lb (98 kg)   LMP  (LMP Unknown)   BMI 37.08 kg/m   Wt Readings from Last 3 Encounters:  11/04/19 216 lb (98 kg)  03/05/19 212 lb (96.2 kg)  09/02/18 212 lb (96.2 kg)    Physical Exam Vitals and nursing note reviewed.  Constitutional:      General: She is awake. She is not in acute distress.    Appearance: She is well-developed. She is obese. She is not ill-appearing.  HENT:     Head: Normocephalic.     Right Ear: Hearing normal.     Left Ear: Hearing normal.  Eyes:     General: Lids are normal.        Right eye: No discharge.  Left eye: No discharge.     Conjunctiva/sclera: Conjunctivae normal.     Pupils: Pupils are equal, round, and reactive to light.  Neck:     Thyroid: No thyromegaly.     Vascular: No carotid bruit.  Cardiovascular:     Rate and Rhythm: Normal rate and regular rhythm.     Heart sounds: Normal heart sounds. No murmur. No gallop.   Pulmonary:     Effort: Pulmonary effort is normal. No accessory muscle usage or respiratory distress.     Breath sounds: Normal breath sounds.  Abdominal:     General: Bowel sounds are normal.     Palpations: Abdomen is soft.  Musculoskeletal:     Cervical back: Normal range of motion and neck supple.     Right lower leg: No edema.     Left lower leg: No edema.  Skin:    General: Skin is warm and dry.       Neurological:     Mental  Status: She is alert and oriented to person, place, and time.  Psychiatric:        Attention and Perception: Attention normal.        Mood and Affect: Mood normal.        Speech: Speech normal.        Behavior: Behavior normal. Behavior is cooperative.        Thought Content: Thought content normal.     Results for orders placed or performed in visit on 03/05/19  CBC with Differential/Platelet  Result Value Ref Range   WBC 4.9 3.4 - 10.8 x10E3/uL   RBC 4.13 3.77 - 5.28 x10E6/uL   Hemoglobin 12.4 11.1 - 15.9 g/dL   Hematocrit 36.4 34.0 - 46.6 %   MCV 88 79 - 97 fL   MCH 30.0 26.6 - 33.0 pg   MCHC 34.1 31.5 - 35.7 g/dL   RDW 12.6 11.7 - 15.4 %   Platelets 158 150 - 450 x10E3/uL   Neutrophils 50 Not Estab. %   Lymphs 37 Not Estab. %   Monocytes 9 Not Estab. %   Eos 2 Not Estab. %   Basos 2 Not Estab. %   Neutrophils Absolute 2.5 1.4 - 7.0 x10E3/uL   Lymphocytes Absolute 1.8 0.7 - 3.1 x10E3/uL   Monocytes Absolute 0.4 0.1 - 0.9 x10E3/uL   EOS (ABSOLUTE) 0.1 0.0 - 0.4 x10E3/uL   Basophils Absolute 0.1 0.0 - 0.2 x10E3/uL   Immature Granulocytes 0 Not Estab. %   Immature Grans (Abs) 0.0 0.0 - 0.1 x10E3/uL  Comprehensive metabolic panel  Result Value Ref Range   Glucose 86 65 - 99 mg/dL   BUN 15 8 - 27 mg/dL   Creatinine, Ser 0.75 0.57 - 1.00 mg/dL   GFR calc non Af Amer 83 >59 mL/min/1.73   GFR calc Af Amer 96 >59 mL/min/1.73   BUN/Creatinine Ratio 20 12 - 28   Sodium 140 134 - 144 mmol/L   Potassium 4.2 3.5 - 5.2 mmol/L   Chloride 102 96 - 106 mmol/L   CO2 26 20 - 29 mmol/L   Calcium 8.9 8.7 - 10.3 mg/dL   Total Protein 6.0 6.0 - 8.5 g/dL   Albumin 4.0 3.8 - 4.8 g/dL   Globulin, Total 2.0 1.5 - 4.5 g/dL   Albumin/Globulin Ratio 2.0 1.2 - 2.2   Bilirubin Total 1.1 0.0 - 1.2 mg/dL   Alkaline Phosphatase 46 39 - 117 IU/L   AST 21 0 - 40 IU/L  ALT 14 0 - 32 IU/L  Lipid Panel w/o Chol/HDL Ratio  Result Value Ref Range   Cholesterol, Total 196 100 - 199 mg/dL    Triglycerides 101 0 - 149 mg/dL   HDL 46 >39 mg/dL   VLDL Cholesterol Cal 20 5 - 40 mg/dL   LDL Calculated 130 (H) 0 - 99 mg/dL  TSH  Result Value Ref Range   TSH 3.320 0.450 - 4.500 uIU/mL  VITAMIN D 25 Hydroxy (Vit-D Deficiency, Fractures)  Result Value Ref Range   Vit D, 25-Hydroxy 23.9 (L) 30.0 - 100.0 ng/mL      Assessment & Plan:   Problem List Items Addressed This Visit      Other   Hyperlipidemia - Primary    Chronic, ongoing with poor statin tolerance in past. Continue current medication regimen, Zetia and Metamucil.  Lipid panel today.      Relevant Orders   Lipid Panel w/o Chol/HDL Ratio   Basic metabolic panel   Obesity    Recommended eating smaller high protein, low fat meals more frequently and exercising 30 mins a day 5 times a week with a goal of 10-15lb weight loss in the next 3 months. Patient voiced their understanding and motivation to adhere to these recommendations.       Heart burn    Ongoing and improved with Pepcid.  Will continue current medication regimen and adjust as needed.  Obtain BMP today.         Other Visit Diagnoses    Pain of left upper extremity       Referral to vascular surgery to further assess vein enlargement left antecubital, patient reports this has increased in size. Denies pain, no red flags today.   Relevant Orders   Ambulatory referral to Vascular Surgery       Follow up plan: Return in about 6 months (around 05/05/2020) for Annual physical.

## 2019-11-04 NOTE — Assessment & Plan Note (Signed)
Ongoing and improved with Pepcid.  Will continue current medication regimen and adjust as needed.  Obtain BMP today.

## 2019-11-04 NOTE — Assessment & Plan Note (Signed)
Chronic, ongoing with poor statin tolerance in past. Continue current medication regimen, Zetia and Metamucil.  Lipid panel today.

## 2019-11-05 LAB — BASIC METABOLIC PANEL
BUN/Creatinine Ratio: 17 (ref 12–28)
BUN: 12 mg/dL (ref 8–27)
CO2: 23 mmol/L (ref 20–29)
Calcium: 8.7 mg/dL (ref 8.7–10.3)
Chloride: 105 mmol/L (ref 96–106)
Creatinine, Ser: 0.71 mg/dL (ref 0.57–1.00)
GFR calc Af Amer: 102 mL/min/{1.73_m2} (ref >59)
GFR calc non Af Amer: 88 mL/min/{1.73_m2} (ref >59)
Glucose: 81 mg/dL (ref 65–99)
Potassium: 3.9 mmol/L (ref 3.5–5.2)
Sodium: 142 mmol/L (ref 134–144)

## 2019-11-05 LAB — LIPID PANEL W/O CHOL/HDL RATIO
Cholesterol, Total: 196 mg/dL (ref 100–199)
HDL: 46 mg/dL (ref >39)
LDL Chol Calc (NIH): 128 mg/dL — ABNORMAL HIGH (ref 0–99)
Triglycerides: 125 mg/dL (ref 0–149)
VLDL Cholesterol Cal: 22 mg/dL (ref 5–40)

## 2019-11-05 NOTE — Progress Notes (Signed)
Contacted via MyChart The 10-year ASCVD risk score Mikey Bussing DC Jr., et al., 2013) is: 5.7%   Values used to calculate the score:     Age: 69 years     Sex: Female     Is Non-Hispanic African American: No     Diabetic: No     Tobacco smoker: No     Systolic Blood Pressure: 123456 mmHg     Is BP treated: No     HDL Cholesterol: 46 mg/dL     Total Cholesterol: 196 mg/dL

## 2019-11-13 ENCOUNTER — Other Ambulatory Visit (INDEPENDENT_AMBULATORY_CARE_PROVIDER_SITE_OTHER): Payer: Self-pay | Admitting: Vascular Surgery

## 2019-11-13 DIAGNOSIS — M79602 Pain in left arm: Secondary | ICD-10-CM

## 2019-11-17 ENCOUNTER — Ambulatory Visit (INDEPENDENT_AMBULATORY_CARE_PROVIDER_SITE_OTHER): Payer: Medicare PPO | Admitting: Vascular Surgery

## 2019-11-17 ENCOUNTER — Ambulatory Visit (INDEPENDENT_AMBULATORY_CARE_PROVIDER_SITE_OTHER): Payer: Medicare PPO

## 2019-11-17 ENCOUNTER — Other Ambulatory Visit: Payer: Self-pay

## 2019-11-17 ENCOUNTER — Encounter (INDEPENDENT_AMBULATORY_CARE_PROVIDER_SITE_OTHER): Payer: Self-pay | Admitting: Vascular Surgery

## 2019-11-17 VITALS — BP 126/76 | HR 60 | Ht 65.0 in | Wt 216.0 lb

## 2019-11-17 DIAGNOSIS — I868 Varicose veins of other specified sites: Secondary | ICD-10-CM

## 2019-11-17 DIAGNOSIS — R12 Heartburn: Secondary | ICD-10-CM | POA: Diagnosis not present

## 2019-11-17 DIAGNOSIS — M79602 Pain in left arm: Secondary | ICD-10-CM | POA: Diagnosis not present

## 2019-11-17 DIAGNOSIS — E782 Mixed hyperlipidemia: Secondary | ICD-10-CM | POA: Diagnosis not present

## 2019-11-22 ENCOUNTER — Encounter (INDEPENDENT_AMBULATORY_CARE_PROVIDER_SITE_OTHER): Payer: Self-pay | Admitting: Vascular Surgery

## 2019-11-22 DIAGNOSIS — I868 Varicose veins of other specified sites: Secondary | ICD-10-CM | POA: Insufficient documentation

## 2019-11-22 HISTORY — DX: Varicose veins of other specified sites: I86.8

## 2019-11-22 NOTE — Progress Notes (Signed)
MRN : [id number]  Patricia Ferguson is a 69 y.o. (28-Mar-1951) female who presents with chief complaint of  Chief Complaint  Patient presents with  . New Patient (Initial Visit)    LUE VEN DVT per GS LUE pain  .  History of Present Illness:   Chief complaint: The patient presents for evaluation of a prominent vein in the left antecubital fossa.   Location: left arm Character/quality of the symptom:  Intermittent aching and soreness Severity:  Moderate/ irritating Duration:  variable Timing/onset:  random Aggravating/context:  None noted Relieving/modifying:  elevation  Current Meds  Medication Sig  . cholecalciferol (VITAMIN D3) 25 MCG (1000 UT) tablet Take 5,000 Units by mouth daily.  Marland Kitchen ezetimibe (ZETIA) 10 MG tablet Take 1 tablet (10 mg total) by mouth daily.  . famotidine (PEPCID) 20 MG tablet Take 1 tablet (20 mg total) by mouth daily.  . Magnesium 500 MG CAPS Take 1 capsule by mouth 2 (two) times daily.  . psyllium (METAMUCIL SMOOTH TEXTURE) 28 % packet Take 1 packet by mouth 2 (two) times daily.  . raloxifene (EVISTA) 60 MG tablet Take 1 tablet (60 mg total) by mouth daily.    Past Medical History:  Diagnosis Date  . Allergy   . FH: colonic polyps   . Hypercholesterolemia   . Internal hemorrhoids   . Obesity   . Osteopenia   . Walking pneumonia     Past Surgical History:  Procedure Laterality Date  . ABDOMINAL HYSTERECTOMY    . ANAL FISSURE REPAIR    . APPENDECTOMY    . bone spur    . BREAST BIOPSY Right 1980's  . BREAST SURGERY    . CERVICAL CONE BIOPSY    . lumbar fission      Social History Social History   Tobacco Use  . Smoking status: Never Smoker  . Smokeless tobacco: Never Used  Substance Use Topics  . Alcohol use: Not Currently    Alcohol/week: 0.0 standard drinks    Comment: on occasion  . Drug use: No    Family History Family History  Problem Relation Age of Onset  . Diabetes Mother   . Hyperlipidemia Mother   . Hypertension  Mother   . Osteoporosis Mother   . Cancer Father        lung  . Diabetes Father   . Diabetes Sister   . Hyperlipidemia Sister   . Hypertension Sister   . Hypertension Brother   . Hyperlipidemia Sister   . Breast cancer Neg Hx   No family history of bleeding/clotting disorders, porphyria or autoimmune disease   Allergies  Allergen Reactions  . Lipitor [Atorvastatin] Other (See Comments)    Muscle aches Muscle aches  . Pravachol [Pravastatin Sodium] Other (See Comments)    Muscle aches   . Propoxyphene Other (See Comments)    depression  . Oxycodone Rash  . Percodan [Oxycodone-Aspirin] Rash     REVIEW OF SYSTEMS (Negative unless checked)  Constitutional: [] Weight loss  [] Fever  [] Chills Cardiac: [] Chest pain   [] Chest pressure   [] Palpitations   [] Shortness of breath when laying flat   [] Shortness of breath with exertion. Vascular:  [] Pain in legs with walking   [] Pain in legs at rest  [] History of DVT   [] Phlebitis   [] Swelling in legs   [] Varicose veins   [] Non-healing ulcers Pulmonary:   [] Uses home oxygen   [] Productive cough   [] Hemoptysis   [] Wheeze  [] COPD   [] Asthma Neurologic:  []   Dizziness   [] Seizures   [] History of stroke   [] History of TIA  [] Aphasia   [] Vissual changes   [] Weakness or numbness in arm   [] Weakness or numbness in leg Musculoskeletal:   [] Joint swelling   [] Joint pain   [] Low back pain Hematologic:  [] Easy bruising  [] Easy bleeding   [] Hypercoagulable state   [] Anemic Gastrointestinal:  [] Diarrhea   [] Vomiting  [] Gastroesophageal reflux/heartburn   [] Difficulty swallowing. Genitourinary:  [] Chronic kidney disease   [] Difficult urination  [] Frequent urination   [] Blood in urine Skin:  [] Rashes   [] Ulcers  Psychological:  [] History of anxiety   []  History of major depression.  Physical Examination  Vitals:   11/17/19 1318  BP: 126/76  Pulse: 60  Weight: 216 lb (98 kg)  Height: 5\' 5"  (1.651 m)   Body mass index is 35.94 kg/m. Gen: WD/WN, NAD  Head: [place]/AT, No temporalis wasting.  Ear/Nose/Throat: Hearing grossly intact, nares w/o erythema or drainage, poor dentition Eyes: PER, EOMI, sclera nonicteric.  Neck: Supple, no masses.  No bruit or JVD.  Pulmonary:  Good air movement, clear to auscultation bilaterally, no use of accessory muscles.  Cardiac: RRR, normal S1, S2, no Murmurs. Vascular: venous aneurysm left antecubital fossa, compressible and tender to palpation Vessel Right Left  Radial Palpable Palpable  Brachial Palpable Palpable  Gastrointestinal: soft, non-distended. No guarding/no peritoneal signs.  Musculoskeletal: M/S 5/5 throughout.  No deformity or atrophy.  Neurologic: CN 2-12 intact. Pain and light touch intact in extremities.  Symmetrical.  Speech is fluent. Motor exam as listed above. Psychiatric: Judgment intact, Mood & affect appropriate for pt's clinical situation. Dermatologic: No rashes or ulcers noted.  No changes consistent with cellulitis.  CBC Lab Results  Component Value Date   WBC 4.9 03/05/2019   HGB 12.4 03/05/2019   HCT 36.4 03/05/2019   MCV 88 03/05/2019   PLT 158 03/05/2019    BMET    Component Value Date/Time   NA 142 11/04/2019 0932   K 3.9 11/04/2019 0932   CL 105 11/04/2019 0932   CO2 23 11/04/2019 0932   GLUCOSE 81 11/04/2019 0932   BUN 12 11/04/2019 0932   CREATININE 0.71 11/04/2019 0932   CALCIUM 8.7 11/04/2019 0932   GFRNONAA 88 11/04/2019 0932   GFRAA 102 11/04/2019 0932   Estimated Creatinine Clearance: 79.1 mL/min (by C-G formula based on SCr of 0.71 mg/dL).  COAG No results found for: INR, PROTIME  Radiology VAS [place] UPPER EXTREMITY VENOUS DUPLEX  Result Date: 11/17/2019 UPPER VENOUS STUDY  Other Indications: Left arm dilated vein in ACF region for many years. Performing Technologist: Concha Norway RVT  Examination Guidelines: A complete evaluation includes B-mode imaging, spectral Doppler, color Doppler, and power Doppler as needed of all accessible portions of  each vessel. Bilateral testing is considered an integral part of a complete examination. Limited examinations for reoccurring indications may be performed as noted.  Left Findings: +----------+------------+---------+-----------+----------+-------+ LEFT      CompressiblePhasicitySpontaneousPropertiesSummary +----------+------------+---------+-----------+----------+-------+ IJV           Full       Yes       Yes                      +----------+------------+---------+-----------+----------+-------+ Subclavian    Full       Yes       Yes                      +----------+------------+---------+-----------+----------+-------+ Axillary  Full       Yes       Yes                      +----------+------------+---------+-----------+----------+-------+ Brachial      Full       Yes       Yes                      +----------+------------+---------+-----------+----------+-------+ Radial        Full       Yes       Yes                      +----------+------------+---------+-----------+----------+-------+ Ulnar         Full       Yes       Yes                      +----------+------------+---------+-----------+----------+-------+ Cephalic      Full       Yes       Yes                      +----------+------------+---------+-----------+----------+-------+ Basilic       Full       Yes       Yes                      +----------+------------+---------+-----------+----------+-------+  Summary:  Left: No evidence of deep vein thrombosis in the upper extremity. No evidence of superficial vein thrombosis in the upper extremity. The cephalic vein in the ACF region is dilated and measures .85cm at the widest area. No evidence of SVT in this vein.  *See table(s) above for measurements and observations.  Diagnosing physician: [name] MD Electronically signed by [name] MD on 11/17/2019 at 5:29:00 PM.    Final      Assessment/Plan 1. 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  I discussed with her that this is not a life or limb threatening condition.  I also discussed excision if she so chooses.  She wishes to consider this and will follow up PRN  2. Mixed hyperlipidemia Continue statin as ordered and reviewed, no changes at this time   3. Heart burn Continue PPI as already ordered, this medication has been reviewed and there are no changes at this time.  Avoidence of caffeine and alcohol  Moderate elevation of the head of the bed     [name], MD  11/22/2019 1:01 PM

## 2019-12-23 ENCOUNTER — Telehealth (INDEPENDENT_AMBULATORY_CARE_PROVIDER_SITE_OTHER): Payer: Self-pay

## 2019-12-23 NOTE — Telephone Encounter (Signed)
Spoke with the patient and she is now scheduled with Dr. Delana Meyer for a resection of left antecubital venous aneurysm on 01/07/20. Patient will do a phone call pre-op on 12/31/19 between 8-1 pm and covid testing on 01/05/20 between 8-1 pm at the [facility]. Pre-surgical instructions were discussed and will be mailed to the patient.

## 2019-12-30 ENCOUNTER — Other Ambulatory Visit (INDEPENDENT_AMBULATORY_CARE_PROVIDER_SITE_OTHER): Payer: Self-pay | Admitting: Nurse Practitioner

## 2019-12-31 ENCOUNTER — Encounter
Admission: RE | Admit: 2019-12-31 | Discharge: 2019-12-31 | Disposition: A | Discharge status: Home / Self Care | Payer: Medicare PPO | Source: Ambulatory Visit | Attending: Vascular Surgery | Admitting: Vascular Surgery

## 2019-12-31 ENCOUNTER — Other Ambulatory Visit: Payer: Self-pay

## 2019-12-31 DIAGNOSIS — Z01812 Encounter for preprocedural laboratory examination: Secondary | ICD-10-CM | POA: Insufficient documentation

## 2019-12-31 HISTORY — DX: Unspecified osteoarthritis, unspecified site: M19.90

## 2019-12-31 HISTORY — DX: Gastro-esophageal reflux disease without esophagitis: K21.9

## 2019-12-31 NOTE — Patient Instructions (Addendum)
INSTRUCTIONS FOR SURGERY     Your surgery is scheduled for:   Wednesday, June 16TH     To find out your arrival time for the day of surgery,          please call [phone] between 1 pm and 3 pm on :  Tuesday, June 15TH     When you arrive for surgery, report to the [facility].       Do NOT stop on the first floor to register.    REMEMBER: Instructions that are not followed completely may result in serious medical risk,  up to and including death, or upon the discretion of your surgeon and anesthesiologist,            your surgery may need to be rescheduled.  __X__ 1. Do not eat food after midnight the night before your procedure.                    No gum, candy, lozenger, tic tacs, tums or hard candies.                  ABSOLUTELY NOTHING SOLID IN YOUR MOUTH AFTER MIDNIGHT                    You may drink unlimited clear liquids up to 2 hours before you are scheduled to arrive for surgery.                   Do not drink anything within those 2 hours unless you need to take medicine, then take the                   smallest amount you need.  Clear liquids include:  water, apple juice without pulp,                   any flavor Gatorade, Black coffee, black tea.  Sugar may be added but no dairy/ honey /lemon.                        Broth and jello is not considered a clear liquid.  __x__  2. On the morning of surgery, please brush your teeth with toothpaste and water. You may rinse with                  mouthwash if you wish but DO NOT SWALLOW TOOTHPASTE OR MOUTHWASH  __X___3. NO alcohol for 24 hours before or after surgery.  __x___ 4.  Do NOT smoke or use e-cigarettes for 24 HOURS PRIOR TO SURGERY.                      DO NOT Use any chewable tobacco products for at least 6 hours prior to surgery.  __x___ 5. If you start any new medication after this appointment and prior to surgery, please                    Bring it with you on the day of surgery.  ___x__ 6. Notify your doctor if there is any change in  your medical condition, such as fever,                   infection, vomitting, diarrhea or any open sores.  __x___ 7.  USE the CHG SOAP as instructed, the night before surgery and the day of surgery.                   Once you have washed with this soap, do NOT use any of the following: Powders, perfumes                    or lotions. Please do not wear make up, hairpins, clips or nail polish. You may wear deodorant.                   Men may shave their face and neck.  Women need to shave 48 hours prior to surgery.                   DO NOT wear ANY jewelry on the day of surgery. If there are rings that are too tight to                    remove easily, please address this prior to the surgery day. Piercings need to be removed.                                                                     NO METAL ON YOUR BODY.                    Do NOT bring any valuables.  If you came to Pre-Admit testing then you will not need license,                     insurance card or credit card.  If you will be staying overnight, please either leave your things in                     the car or have your family be responsible for these items.                     [facility] IS NOT RESPONSIBLE FOR BELONGINGS OR VALUABLES.  ___X__ 8. DO NOT wear contact lenses on surgery day.  You may not have dentures,                     Hearing aides, contacts or glasses in the operating room. These items can be                    Placed in the Recovery Room to receive immediately after surgery.  __x___ 9. IF YOU ARE SCHEDULED TO GO HOME ON THE SAME DAY, YOU MUST                   Have someone to drive you home and to stay with you  for the first 24 hours.                    Have an arrangement prior to arriving on surgery day.  ___x__ 10. Take the following medications on  the morning of surgery with a sip of water:                               1.PEPCID                     2.ISOPTO EYE DROPS                     3.                      _____ 11.  Follow any instructions provided to you by your surgeon.                        Such as enema, clear liquid bowel prep  __X__  12. STOP ALL ASPIRIN PRODUCTS ONE WEEK PRIOR TO SURGERY. (12/31/19)                       THIS INCLUDES BC POWDERS / GOODIES POWDER  __X___ 13. STOP Anti-inflammatories as of: ONE WEEK PRIOR TO SURGERY (12/31/19)                      This includes IBUPROFEN / MOTRIN / ADVIL / ALEVE/ NAPROXYN                    YOU MAY TAKE TYLENOL ANY TIME PRIOR TO SURGERY.  __X___ 14.  Stop supplements until after surgery.                     This includes: MAGNESIUM   ___X__18.  Wear clean and comfortable clothing to the hospital. Have a shirt with a stretchy or loose sleeve.  If you have the Medical Directives completed and notarized, please bring with you    So we can make a copy for your record.  BRING PHONE NUMBERS FOR YOUR CONTACTS.  Everyone entering the hospital must wear a face mask.

## 2020-01-05 ENCOUNTER — Other Ambulatory Visit
Admission: RE | Admit: 2020-01-05 | Discharge: 2020-01-05 | Disposition: A | Discharge status: Home / Self Care | Payer: Medicare PPO | Source: Ambulatory Visit | Attending: Vascular Surgery | Admitting: Vascular Surgery

## 2020-01-05 ENCOUNTER — Other Ambulatory Visit: Payer: Self-pay

## 2020-01-05 DIAGNOSIS — Z01818 Encounter for other preprocedural examination: Secondary | ICD-10-CM | POA: Insufficient documentation

## 2020-01-05 DIAGNOSIS — Z01810 Encounter for preprocedural cardiovascular examination: Secondary | ICD-10-CM | POA: Diagnosis not present

## 2020-01-05 DIAGNOSIS — Z20822 Contact with and (suspected) exposure to covid-19: Secondary | ICD-10-CM | POA: Diagnosis not present

## 2020-01-05 LAB — CBC WITH DIFFERENTIAL/PLATELET
Abs Immature Granulocytes: 0.01 10*3/uL (ref 0.00–0.07)
Basophils Absolute: 0.1 10*3/uL (ref 0.0–0.1)
Basophils Relative: 1 %
Eosinophils Absolute: 0.2 10*3/uL (ref 0.0–0.5)
Eosinophils Relative: 3 %
HCT: 34.6 % — ABNORMAL LOW (ref 36.0–46.0)
Hemoglobin: 11.9 g/dL — ABNORMAL LOW (ref 12.0–15.0)
Immature Granulocytes: 0 %
Lymphocytes Relative: 25 %
Lymphs Abs: 1.2 10*3/uL (ref 0.7–4.0)
MCH: 29.8 pg (ref 26.0–34.0)
MCHC: 34.4 g/dL (ref 30.0–36.0)
MCV: 86.7 fL (ref 80.0–100.0)
Monocytes Absolute: 0.5 10*3/uL (ref 0.1–1.0)
Monocytes Relative: 10 %
Neutro Abs: 3 10*3/uL (ref 1.7–7.7)
Neutrophils Relative %: 61 %
Platelets: 147 10*3/uL — ABNORMAL LOW (ref 150–400)
RBC: 3.99 MIL/uL (ref 3.87–5.11)
RDW: 12.3 % (ref 11.5–15.5)
WBC: 4.9 10*3/uL (ref 4.0–10.5)
nRBC: 0 % (ref 0.0–0.2)

## 2020-01-05 LAB — BASIC METABOLIC PANEL
Anion gap: 7 (ref 5–15)
BUN: 15 mg/dL (ref 8–23)
CO2: 28 mmol/L (ref 22–32)
Calcium: 8.2 mg/dL — ABNORMAL LOW (ref 8.9–10.3)
Chloride: 106 mmol/L (ref 98–111)
Creatinine, Ser: 0.54 mg/dL (ref 0.44–1.00)
GFR calc Af Amer: >60 mL/min (ref >60)
GFR calc non Af Amer: >60 mL/min (ref >60)
Glucose, Bld: 90 mg/dL (ref 70–99)
Potassium: 3.6 mmol/L (ref 3.5–5.1)
Sodium: 141 mmol/L (ref 135–145)

## 2020-01-05 LAB — PROTIME-INR
INR: 0.9 (ref 0.8–1.2)
Prothrombin Time: 12.1 seconds (ref 11.4–15.2)

## 2020-01-05 LAB — APTT: aPTT: 29 seconds (ref 24–36)

## 2020-01-05 LAB — SARS CORONAVIRUS 2 (TAT 6-24 HRS): SARS Coronavirus 2: NEGATIVE

## 2020-01-07 ENCOUNTER — Encounter: Payer: Self-pay | Admitting: Vascular Surgery

## 2020-01-07 ENCOUNTER — Ambulatory Visit
Admission: RE | Admit: 2020-01-07 | Discharge: 2020-01-07 | Disposition: A | Discharge status: Home / Self Care | Payer: Medicare PPO | Attending: Vascular Surgery | Admitting: Vascular Surgery

## 2020-01-07 ENCOUNTER — Encounter
Admission: RE | Disposition: A | Discharge status: Home / Self Care | Payer: Self-pay | Source: Home / Self Care | Attending: Vascular Surgery

## 2020-01-07 ENCOUNTER — Ambulatory Visit: Payer: Medicare PPO | Admitting: Anesthesiology

## 2020-01-07 ENCOUNTER — Other Ambulatory Visit: Payer: Self-pay

## 2020-01-07 DIAGNOSIS — Z801 Family history of malignant neoplasm of trachea, bronchus and lung: Secondary | ICD-10-CM | POA: Diagnosis not present

## 2020-01-07 DIAGNOSIS — E669 Obesity, unspecified: Secondary | ICD-10-CM | POA: Diagnosis not present

## 2020-01-07 DIAGNOSIS — Z833 Family history of diabetes mellitus: Secondary | ICD-10-CM | POA: Insufficient documentation

## 2020-01-07 DIAGNOSIS — Z79899 Other long term (current) drug therapy: Secondary | ICD-10-CM | POA: Diagnosis not present

## 2020-01-07 DIAGNOSIS — E7800 Pure hypercholesterolemia, unspecified: Secondary | ICD-10-CM | POA: Diagnosis not present

## 2020-01-07 DIAGNOSIS — Z8371 Family history of colonic polyps: Secondary | ICD-10-CM | POA: Diagnosis not present

## 2020-01-07 DIAGNOSIS — R12 Heartburn: Secondary | ICD-10-CM | POA: Insufficient documentation

## 2020-01-07 DIAGNOSIS — E782 Mixed hyperlipidemia: Secondary | ICD-10-CM | POA: Insufficient documentation

## 2020-01-07 DIAGNOSIS — Z888 Allergy status to other drugs, medicaments and biological substances status: Secondary | ICD-10-CM | POA: Insufficient documentation

## 2020-01-07 DIAGNOSIS — M1990 Unspecified osteoarthritis, unspecified site: Secondary | ICD-10-CM | POA: Insufficient documentation

## 2020-01-07 DIAGNOSIS — Z6836 Body mass index (BMI) 36.0-36.9, adult: Secondary | ICD-10-CM | POA: Insufficient documentation

## 2020-01-07 DIAGNOSIS — Z885 Allergy status to narcotic agent status: Secondary | ICD-10-CM | POA: Diagnosis not present

## 2020-01-07 DIAGNOSIS — Z8249 Family history of ischemic heart disease and other diseases of the circulatory system: Secondary | ICD-10-CM | POA: Diagnosis not present

## 2020-01-07 DIAGNOSIS — Z90710 Acquired absence of both cervix and uterus: Secondary | ICD-10-CM | POA: Diagnosis not present

## 2020-01-07 DIAGNOSIS — Q278 Other specified congenital malformations of peripheral vascular system: Secondary | ICD-10-CM | POA: Diagnosis not present

## 2020-01-07 DIAGNOSIS — K219 Gastro-esophageal reflux disease without esophagitis: Secondary | ICD-10-CM | POA: Insufficient documentation

## 2020-01-07 DIAGNOSIS — M8580 Other specified disorders of bone density and structure, unspecified site: Secondary | ICD-10-CM | POA: Diagnosis not present

## 2020-01-07 DIAGNOSIS — I729 Aneurysm of unspecified site: Secondary | ICD-10-CM | POA: Diagnosis present

## 2020-01-07 DIAGNOSIS — I868 Varicose veins of other specified sites: Secondary | ICD-10-CM | POA: Insufficient documentation

## 2020-01-07 HISTORY — PX: MASS EXCISION: SHX2000

## 2020-01-07 LAB — TYPE AND SCREEN
ABO/RH(D): A POS
Antibody Screen: NEGATIVE

## 2020-01-07 LAB — ABO/RH: ABO/RH(D): A POS

## 2020-01-07 SURGERY — EXCISION MASS
Anesthesia: General | Laterality: Left

## 2020-01-07 MED ORDER — LIDOCAINE HCL (CARDIAC) PF 100 MG/5ML IV SOSY
PREFILLED_SYRINGE | INTRAVENOUS | Status: DC | PRN
  Administered 2020-01-07: 100 mg via INTRAVENOUS

## 2020-01-07 MED ORDER — LACTATED RINGERS IV SOLN: INTRAVENOUS | Status: DC

## 2020-01-07 MED ORDER — MORPHINE SULFATE (PF) 2 MG/ML IV SOLN
INTRAVENOUS | Status: AC
  Administered 2020-01-07: 2 mg via INTRAVENOUS
  Filled 2020-01-07: qty 1

## 2020-01-07 MED ORDER — CEFAZOLIN SODIUM-DEXTROSE 2-4 GM/100ML-% IV SOLN
2.0000 g | INTRAVENOUS | Status: AC
  Administered 2020-01-07: 2 g via INTRAVENOUS

## 2020-01-07 MED ORDER — CHLORHEXIDINE GLUCONATE CLOTH 2 % EX PADS: 6.0000 | MEDICATED_PAD | Freq: Once | CUTANEOUS | Status: DC

## 2020-01-07 MED ORDER — DEXAMETHASONE SODIUM PHOSPHATE 10 MG/ML IJ SOLN
INTRAMUSCULAR | Status: DC | PRN
  Administered 2020-01-07: 10 mg via INTRAVENOUS

## 2020-01-07 MED ORDER — CHLORHEXIDINE GLUCONATE 0.12 % MT SOLN
OROMUCOSAL | Status: AC
  Administered 2020-01-07: 15 mL via OROMUCOSAL
  Filled 2020-01-07: qty 15

## 2020-01-07 MED ORDER — TRAMADOL HCL 50 MG PO TABS: 50.0000 mg | ORAL_TABLET | Freq: Four times a day (QID) | ORAL | 0 refills | Status: DC | PRN

## 2020-01-07 MED ORDER — ONDANSETRON HCL 4 MG/2ML IJ SOLN: 4.0000 mg | Freq: Once | INTRAMUSCULAR | Status: DC | PRN

## 2020-01-07 MED ORDER — FENTANYL CITRATE (PF) 100 MCG/2ML IJ SOLN: 25.0000 ug | INTRAMUSCULAR | Status: DC | PRN

## 2020-01-07 MED ORDER — DIPHENHYDRAMINE HCL 50 MG/ML IJ SOLN: 25.0000 mg | Freq: Once | INTRAMUSCULAR | Status: AC

## 2020-01-07 MED ORDER — FENTANYL CITRATE (PF) 100 MCG/2ML IJ SOLN
INTRAMUSCULAR | Status: DC | PRN
  Administered 2020-01-07: 25 ug via INTRAVENOUS

## 2020-01-07 MED ORDER — PROPOFOL 10 MG/ML IV BOLUS
INTRAVENOUS | Status: DC | PRN
  Administered 2020-01-07: 50 mg via INTRAVENOUS
  Administered 2020-01-07: 150 mg via INTRAVENOUS

## 2020-01-07 MED ORDER — PROPOFOL 10 MG/ML IV BOLUS
INTRAVENOUS | Status: AC
  Filled 2020-01-07: qty 20

## 2020-01-07 MED ORDER — ONDANSETRON HCL 4 MG/2ML IJ SOLN
INTRAMUSCULAR | Status: DC | PRN
  Administered 2020-01-07: 4 mg via INTRAVENOUS

## 2020-01-07 MED ORDER — CEFAZOLIN SODIUM-DEXTROSE 2-4 GM/100ML-% IV SOLN
INTRAVENOUS | Status: AC
  Filled 2020-01-07: qty 100

## 2020-01-07 MED ORDER — BUPIVACAINE LIPOSOME 1.3 % IJ SUSP
INTRAMUSCULAR | Status: DC | PRN
  Administered 2020-01-07: 6.5 mL

## 2020-01-07 MED ORDER — BUPIVACAINE HCL (PF) 0.25 % IJ SOLN
INTRAMUSCULAR | Status: AC
  Filled 2020-01-07: qty 30

## 2020-01-07 MED ORDER — BUPIVACAINE HCL (PF) 0.25 % IJ SOLN
INTRAMUSCULAR | Status: DC | PRN
  Administered 2020-01-07: 6.5 mL

## 2020-01-07 MED ORDER — LACTATED RINGERS IV SOLN
INTRAVENOUS | Status: DC
  Administered 2020-01-07: 250 mL via INTRAVENOUS

## 2020-01-07 MED ORDER — ORAL CARE MOUTH RINSE: 15.0000 mL | Freq: Once | OROMUCOSAL | Status: AC

## 2020-01-07 MED ORDER — DIPHENHYDRAMINE HCL 50 MG/ML IJ SOLN
INTRAMUSCULAR | Status: AC
  Administered 2020-01-07: 25 mg via INTRAVENOUS
  Filled 2020-01-07: qty 1

## 2020-01-07 MED ORDER — CHLORHEXIDINE GLUCONATE 0.12 % MT SOLN: 15.0000 mL | Freq: Once | OROMUCOSAL | Status: AC

## 2020-01-07 MED ORDER — PHENYLEPHRINE HCL (PRESSORS) 10 MG/ML IV SOLN
INTRAVENOUS | Status: DC | PRN
  Administered 2020-01-07: 200 ug via INTRAVENOUS
  Administered 2020-01-07: 100 ug via INTRAVENOUS
  Administered 2020-01-07: 200 ug via INTRAVENOUS
  Administered 2020-01-07: 100 ug via INTRAVENOUS

## 2020-01-07 MED ORDER — BUPIVACAINE LIPOSOME 1.3 % IJ SUSP
INTRAMUSCULAR | Status: AC
  Filled 2020-01-07: qty 20

## 2020-01-07 MED ORDER — FENTANYL CITRATE (PF) 100 MCG/2ML IJ SOLN
INTRAMUSCULAR | Status: AC
  Filled 2020-01-07: qty 2

## 2020-01-07 SURGICAL SUPPLY — 55 items
APPLIER CLIP 11 MED OPEN (CLIP)
APPLIER CLIP 9.375 SM OPEN (CLIP)
BAG DECANTER FOR FLEXI CONT (MISCELLANEOUS) IMPLANT
BLADE SURG SZ11 CARB STEEL (BLADE) ×3 IMPLANT
BOOT SUTURE AID YELLOW STND (SUTURE) IMPLANT
BRUSH SCRUB EZ  4% CHG (MISCELLANEOUS) ×3
BRUSH SCRUB EZ 4% CHG (MISCELLANEOUS) ×1 IMPLANT
CANISTER SUCT 1200ML W/VALVE (MISCELLANEOUS) ×3 IMPLANT
CHLORAPREP W/TINT 26 (MISCELLANEOUS) ×3 IMPLANT
CLIP APPLIE 11 MED OPEN (CLIP) IMPLANT
CLIP APPLIE 9.375 SM OPEN (CLIP) IMPLANT
COVER WAND RF STERILE (DRAPES) ×3 IMPLANT
DERMABOND ADVANCED (GAUZE/BANDAGES/DRESSINGS) ×2
DERMABOND ADVANCED .7 DNX12 (GAUZE/BANDAGES/DRESSINGS) ×1 IMPLANT
DRESSING SURGICEL FIBRLLR 1X2 (HEMOSTASIS) ×1 IMPLANT
DRSG SURGICEL FIBRILLAR 1X2 (HEMOSTASIS) ×3
ELECT CAUTERY BLADE 6.4 (BLADE) ×3 IMPLANT
ELECT REM PT RETURN 9FT ADLT (ELECTROSURGICAL) ×3
ELECTRODE REM PT RTRN 9FT ADLT (ELECTROSURGICAL) ×1 IMPLANT
GEL ULTRASOUND 20GR AQUASONIC (MISCELLANEOUS) IMPLANT
GLOVE BIO SURGEON STRL SZ7 (GLOVE) ×6 IMPLANT
GLOVE INDICATOR 7.5 STRL GRN (GLOVE) ×6 IMPLANT
GLOVE SURG SYN 8.0 (GLOVE) ×3 IMPLANT
GOWN STRL REUS W/ TWL LRG LVL3 (GOWN DISPOSABLE) ×2 IMPLANT
GOWN STRL REUS W/ TWL XL LVL3 (GOWN DISPOSABLE) ×1 IMPLANT
GOWN STRL REUS W/TWL LRG LVL3 (GOWN DISPOSABLE) ×6
GOWN STRL REUS W/TWL XL LVL3 (GOWN DISPOSABLE) ×3
IV NS 500ML (IV SOLUTION) ×3
IV NS 500ML BAXH (IV SOLUTION) ×1 IMPLANT
KIT TURNOVER KIT A (KITS) ×3 IMPLANT
LABEL OR SOLS (LABEL) ×3 IMPLANT
LOOP RED MAXI  1X406MM (MISCELLANEOUS)
LOOP VESSEL MAXI  1X406 RED (MISCELLANEOUS)
LOOP VESSEL MAXI 1X406 RED (MISCELLANEOUS) IMPLANT
LOOP VESSEL MINI 0.8X406 BLUE (MISCELLANEOUS) IMPLANT
LOOPS BLUE MINI 0.8X406MM (MISCELLANEOUS)
NEEDLE FILTER BLUNT 18X 1/2SAF (NEEDLE) ×2
NEEDLE FILTER BLUNT 18X1 1/2 (NEEDLE) ×1 IMPLANT
NEEDLE HYPO 30X.5 LL (NEEDLE) IMPLANT
PACK EXTREMITY (MISCELLANEOUS) ×3 IMPLANT
PAD PREP 24X41 OB/GYN DISP (PERSONAL CARE ITEMS) IMPLANT
STOCKINETTE STRL 4IN 9604848 (GAUZE/BANDAGES/DRESSINGS) ×3 IMPLANT
SUT MNCRL+ 5-0 UNDYED PC-3 (SUTURE) ×1 IMPLANT
SUT MONOCRYL 5-0 (SUTURE) ×3
SUT PROLENE 6 0 BV (SUTURE) ×3 IMPLANT
SUT SILK 2 0 (SUTURE)
SUT SILK 2-0 18XBRD TIE 12 (SUTURE) IMPLANT
SUT SILK 3 0 (SUTURE) ×3
SUT SILK 3-0 18XBRD TIE 12 (SUTURE) ×1 IMPLANT
SUT SILK 4 0 (SUTURE)
SUT SILK 4-0 18XBRD TIE 12 (SUTURE) IMPLANT
SUT VIC AB 3-0 SH 27 (SUTURE) ×3
SUT VIC AB 3-0 SH 27X BRD (SUTURE) ×1 IMPLANT
SYR 20ML LL LF (SYRINGE) ×3 IMPLANT
SYR 3ML LL SCALE MARK (SYRINGE) ×3 IMPLANT

## 2020-01-07 NOTE — Discharge Instructions (Addendum)
Bupivacaine Liposomal Suspension for Injection °What is this medicine? °BUPIVACAINE LIPOSOMAL (bue PIV a kane LIP oh som al) is an anesthetic. It causes loss of feeling in the skin or other tissues. It is used to prevent and to treat pain from some procedures. °This medicine may be used for other purposes; ask your health care provider or pharmacist if you have questions. °COMMON BRAND NAME(S): EXPAREL °What should I tell my health care provider before I take this medicine? °They need to know if you have any of these conditions: °· G6PD deficiency °· heart disease °· kidney disease °· liver disease °· low blood pressure °· lung or breathing disease, like asthma °· an unusual or allergic reaction to bupivacaine, other medicines, foods, dyes, or preservatives °· pregnant or trying to get pregnant °· breast-feeding °How should I use this medicine? °This medicine is for injection into the affected area. It is given by a health care professional in a hospital or clinic setting. °Talk to your pediatrician regarding the use of this medicine in children. Special care may be needed. °Overdosage: If you think you have taken too much of this medicine contact a poison control center or emergency room at once. °NOTE: This medicine is only for you. Do not share this medicine with others. °What if I miss a dose? °This does not apply. °What may interact with this medicine? °This medicine may interact with the following medications: °· acetaminophen °· certain antibiotics like dapsone, nitrofurantoin, aminosalicylic acid, sulfonamides °· certain medicines for seizures like phenobarbital, phenytoin, valproic acid °· chloroquine °· cyclophosphamide °· flutamide °· hydroxyurea °· ifosfamide °· metoclopramide °· nitric oxide °· nitroglycerin °· nitroprusside °· nitrous oxide °· other local anesthetics like lidocaine, pramoxine, tetracaine °· primaquine °· quinine °· rasburicase °· sulfasalazine °This list may not describe all possible  interactions. Give your health care provider a list of all the medicines, herbs, non-prescription drugs, or dietary supplements you use. Also tell them if you smoke, drink alcohol, or use illegal drugs. Some items may interact with your medicine. °What should I watch for while using this medicine? °Your condition will be monitored carefully while you are receiving this medicine. °Be careful to avoid injury while the area is numb, and you are not aware of pain. °What side effects may I notice from receiving this medicine? °Side effects that you should report to your doctor or health care professional as soon as possible: °· allergic reactions like skin rash, itching or hives, swelling of the face, lips, or tongue °· seizures °· signs and symptoms of a dangerous change in heartbeat or heart rhythm like chest pain; dizziness; fast, irregular heartbeat; palpitations; feeling faint or lightheaded; falls; breathing problems °· signs and symptoms of methemoglobinemia such as pale, gray, or blue colored skin; headache; fast heartbeat; shortness of breath; feeling faint or lightheaded, falls; tiredness °Side effects that usually do not require medical attention (report to your doctor or health care professional if they continue or are bothersome): °· anxious °· back pain °· changes in taste °· changes in vision °· constipation °· dizziness °· fever °· nausea, vomiting °This list may not describe all possible side effects. Call your doctor for medical advice about side effects. You may report side effects to FDA at 1-800-FDA-1088. °Where should I keep my medicine? °This drug is given in a hospital or clinic and will not be stored at home. °NOTE: This sheet is a summary. It may not cover all possible information. If you have questions about this   medicine, talk to your doctor, pharmacist, or health care provider. °© 2020 [employer]/Gold [employer] (2019-04-22 10:48:23) ° ° °AMBULATORY SURGERY  °DISCHARGE INSTRUCTIONS ° ° °1) The  drugs that you were given will stay in your system until tomorrow so for the next 24 hours you should not: ° °A) Drive an automobile °B) Make any legal decisions °C) Drink any alcoholic beverage ° ° °2) You may resume regular meals tomorrow.  Today it is better to start with liquids and gradually work up to solid foods. ° °You may eat anything you prefer, but it is better to start with liquids, then soup and crackers, and gradually work up to solid foods. ° ° °3) Please notify your doctor immediately if you have any unusual bleeding, trouble breathing, redness and pain at the surgery site, drainage, fever, or pain not relieved by medication. ° ° ° °4) Additional Instructions: ° ° ° ° ° ° ° °Please contact your physician with any problems or Same Day Surgery at [phone], Monday through Friday 6 am to 4 pm, or [facility] [place] Main number at [phone]. °

## 2020-01-07 NOTE — Anesthesia Preprocedure Evaluation (Signed)
Anesthesia Evaluation  Patient identified by MRN, date of birth, ID band Patient awake    Reviewed: Allergy & Precautions, NPO status , Patient's Chart, lab work & pertinent test results  History of Anesthesia Complications Negative for: history of anesthetic complications  Airway Mallampati: II  TM Distance: >3 FB Neck ROM: Full    Dental no notable dental hx.    Pulmonary neg pulmonary ROS, neg sleep apnea, neg COPD,    breath sounds clear to auscultation- rhonchi (-) wheezing      Cardiovascular Exercise Tolerance: Good (-) hypertension(-) CAD, (-) Past MI, (-) Cardiac Stents and (-) CABG  Rhythm:Regular Rate:Normal - Systolic murmurs and - Diastolic murmurs    Neuro/Psych neg Seizures negative neurological ROS  negative psych ROS   GI/Hepatic Neg liver ROS, GERD  ,  Endo/Other  negative endocrine ROSneg diabetes  Renal/GU negative Renal ROS     Musculoskeletal  (+) Arthritis ,   Abdominal (+) + obese,   Peds  Hematology negative hematology ROS (+)   Anesthesia Other Findings Past Medical History: No date: Allergy No date: Arthritis     Comment:  right knee No date: FH: colonic polyps No date: GERD (gastroesophageal reflux disease) No date: Hypercholesterolemia No date: Internal hemorrhoids No date: Obesity No date: Osteopenia 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 No date: Walking pneumonia   Reproductive/Obstetrics                             Anesthesia Physical Anesthesia Plan  ASA: II  Anesthesia Plan: General   Post-op Pain Management:    Induction: Intravenous  PONV Risk Score and Plan: 1 and Ondansetron, Dexamethasone and Midazolam  Airway Management Planned: LMA  Additional Equipment:   Intra-op Plan:   Post-operative Plan:   Informed Consent: I have reviewed the patients History and Physical, chart, labs and discussed the procedure  including the risks, benefits and alternatives for the proposed anesthesia with the patient or authorized representative who has indicated his/her understanding and acceptance.     Dental advisory given  Plan Discussed with: CRNA and Anesthesiologist  Anesthesia Plan Comments:         Anesthesia Quick Evaluation

## 2020-01-07 NOTE — Anesthesia Procedure Notes (Signed)
Procedure Name: LMA Insertion Date/Time: 01/07/2020 3:07 PM Performed by: Aline Brochure, CRNA Pre-anesthesia Checklist: Patient identified, Emergency Drugs available, Suction available and Patient being monitored Patient Re-evaluated:Patient Re-evaluated prior to induction Oxygen Delivery Method: Circle system utilized Preoxygenation: Pre-oxygenation with 100% oxygen Induction Type: IV induction Ventilation: Mask ventilation without difficulty LMA: LMA inserted LMA Size: 4.0 Number of attempts: 2 Placement Confirmation: positive ETCO2 and breath sounds checked- equal and bilateral Tube secured with: Tape Dental Injury: Bloody posterior oropharynx

## 2020-01-07 NOTE — Anesthesia Postprocedure Evaluation (Signed)
Anesthesia Post Note  Patient: Patricia Ferguson  Procedure(s) Performed: EXCISION MASS (RESECTION OF ANTECUBITAL VENOUS ANEURYSM) (Left )  Patient location during evaluation: PACU Anesthesia Type: General Level of consciousness: awake and alert Pain management: pain level controlled Vital Signs Assessment: post-procedure vital signs reviewed and stable Respiratory status: spontaneous breathing, nonlabored ventilation and respiratory function stable Cardiovascular status: blood pressure returned to baseline and stable Postop Assessment: no apparent nausea or vomiting Anesthetic complications: no   No complications documented.   Last Vitals:  Vitals:   01/07/20 1651 01/07/20 1708  BP: (!) 141/76 129/63  Pulse: 80 81  Resp: 18 18  Temp: 36.9 C   SpO2: 96% 98%    Last Pain:  Vitals:   01/07/20 1708  TempSrc:   PainSc: [place]

## 2020-01-07 NOTE — H&P (Signed)
[facility] History & Physical Update  The patient was interviewed and re-examined.  The patient's previous History and Physical has been reviewed and is unchanged.  There is no change in the plan of care. We plan to proceed with the scheduled procedure.  [name], MD  01/07/2020, 1:40 PM

## 2020-01-07 NOTE — Transfer of Care (Signed)
Immediate Anesthesia Transfer of Care Note  Patient: Patricia Ferguson  Procedure(s) Performed: EXCISION MASS (RESECTION OF ANTECUBITAL VENOUS ANEURYSM) (Left )  Patient Location: PACU  Anesthesia Type:General  Level of Consciousness: awake  Airway & Oxygen Therapy: Patient connected to face mask oxygen  Post-op Assessment: Post -op Vital signs reviewed and stable  Post vital signs: stable  Last Vitals:  Vitals Value Taken Time  BP 139/80 01/07/20 1606  Temp    Pulse 87 01/07/20 1610  Resp 14 01/07/20 1610  SpO2 98 % 01/07/20 1610  Vitals shown include unvalidated device data.  Last Pain:  Vitals:   01/07/20 1147  TempSrc: Temporal  PainSc: 0-No pain         Complications: No complications documented.

## 2020-01-07 NOTE — Op Note (Signed)
    OPERATIVE NOTE   PROCEDURE: Resection of venous aneurysm left antecubital fossa  PRE-OPERATIVE DIAGNOSIS: Painful venous aneurysm left antecubital fossa  POST-OPERATIVE DIAGNOSIS: Same  SURGEON: [name]  ASSISTANT(S): Ms. Hezzie Bump  ANESTHESIA: general  ESTIMATED BLOOD LOSS: Less than 5 cc  FINDING(S): 2 cm venous aneurysm  SPECIMEN(S): Resected aneurysm sac sent to pathology  INDICATIONS:   Patricia Ferguson is a 69 y.o. female who presents with painful mass in her left antecubital fossa.  Duplex ultrasound and physical examination support a venous aneurysm of the left antecubital fossa.  Patient notes is been getting larger and is becoming more uncomfortable.  Risks and benefits for resection were reviewed all questions were answered patient agrees to proceed.  DESCRIPTION: After full informed written consent was obtained from the patient, the patient was brought back to the operating room and placed supine upon the operating table.  Prior to induction, the patient received IV antibiotics.   After obtaining adequate anesthesia, the patient was then prepped and draped in the standard fashion for a exploration of the left antecubital fossa with resection of the venous aneurysm.  The antecubital crease is identified and marked with a surgical marker.  Linear incision is then made in the same direction and the skin and soft tissues are dissected using both 15 blade scalpel as well as Metzenbaum scissors.  The venous aneurysm is then easily identified.  It is then skeletonized circumferentially.  There is one large inflow and one large egress.  These 2 veins are ligated and divided between 3-0 silk ties.  Several smaller branches are also ligated and divided with 3-0 and 4-0 silk ties.  The aneurysm is then removed en bloc.  The wound is then irrigated inspected for hemostasis which is achieved with Bovie cautery.  Approximately 15 cc of Exparel with quarter percent  Marcaine is then infiltrated into the soft tissues.  The subcutaneous tissues are reapproximated with interrupted 3-0 Vicryl.  Skin is closed with 4-0 Monocryl subcuticular and then Dermabond is applied.   The patient tolerated this procedure well.   COMPLICATIONS: None  CONDITION: Margaretmary Dys  [facility]  Office: [phone]   01/07/2020, 4:13 PM

## 2020-01-08 ENCOUNTER — Encounter: Payer: Self-pay | Admitting: Vascular Surgery

## 2020-01-09 LAB — SURGICAL PATHOLOGY

## 2020-01-16 NOTE — H&P (Signed)
@LOGO @   MRN : [id number]  Patricia Ferguson is a 69 y.o. ([birth year]) female who presents with chief complaint of pain in the left elbow.  History of Present Illness:   The patient presents to [facility] for excision of a painful venous aneurysm.  She was last seen in the office as an outpatient in April complaining of an intermittent aching and soreness associated with a mass in the antecubital fossa of the left arm.  She described the severity of the symptoms is moderate and irritating.  It was somewhat variable as to when the symptoms would occur.  Her PCP raised concerns regarding possible rupture prompting her to be evaluated.  Initially she wished to pursue more conservative therapies with elevation and possible compression using an arm sleeve.  These have not proved to be helpful and she called the office requesting that we move forward with excision.  Current Meds  Medication Sig  . acetaminophen (TYLENOL) 500 MG tablet Take 500 mg by mouth every 6 (six) hours as needed.  . ezetimibe (ZETIA) 10 MG tablet Take 1 tablet (10 mg total) by mouth daily. (Patient taking differently: Take 10 mg by mouth daily. Takes around 4 pm each day)  . famotidine (PEPCID) 20 MG tablet Take 1 tablet (20 mg total) by mouth daily.  . hydroxypropyl methylcellulose / hypromellose (ISOPTO TEARS / GONIOVISC) 2.5 % ophthalmic solution Place 1 drop into both eyes daily.  . Magnesium 500 MG CAPS Take 500 mg by mouth daily. Takes in the evening.  Uses for leg cramps  . raloxifene (EVISTA) 60 MG tablet Take 1 tablet (60 mg total) by mouth daily. (Patient taking differently: Take 60 mg by mouth daily. Takes around 4 pm each day.)    Past Medical History:  Diagnosis Date  . Allergy   . Arthritis    right knee  . FH: colonic polyps   . GERD (gastroesophageal reflux disease)   . Hypercholesterolemia   . Internal hemorrhoids   . Obesity   . Osteopenia   . Venous aneurysm 11/2019   left  antecubital  . Walking pneumonia     Past Surgical History:  Procedure Laterality Date  . ABDOMINAL HYSTERECTOMY    . ANAL FISSURE REPAIR  1980's  . APPENDECTOMY    . BACK SURGERY  2000   lumbar fusion. SCREWS IN BACK  . bone spur    . BREAST BIOPSY Right 1980's  . BREAST SURGERY    . CERVICAL CONE BIOPSY    . lumbar fission    . MASS EXCISION Left 01/07/2020   Procedure: EXCISION MASS (RESECTION OF ANTECUBITAL VENOUS ANEURYSM);  Surgeon: Katha Cabal, MD;  Location: [facility] ORS;  Service: Vascular;  Laterality: Left;    Social History Social History   Tobacco Use  . Smoking status: Never Smoker  . Smokeless tobacco: Never Used  Vaping Use  . Vaping Use: Never used  Substance Use Topics  . Alcohol use: Not Currently    Alcohol/week: 0.0 standard drinks    Comment: on occasion  . Drug use: No    Family History Family History  Problem Relation Age of Onset  . Diabetes Mother   . Hyperlipidemia Mother   . Hypertension Mother   . Osteoporosis Mother   . Cancer Father        lung  . Diabetes Father   . Diabetes Sister   . Hyperlipidemia Sister   . Hypertension Sister   . Hypertension Brother   .  Hyperlipidemia Sister   . Breast cancer Neg Hx     Allergies  Allergen Reactions  . Adhesive [Tape] Rash    PAPER TAPE OKAY  . Lipitor [Atorvastatin] Other (See Comments)    Muscle aches  . Oxycodone Rash  . Percodan [Oxycodone-Aspirin] Rash  . Pravachol [Pravastatin Sodium] Other (See Comments)    Muscle aches   . Propoxyphene Other (See Comments)    depression     REVIEW OF SYSTEMS (Negative unless checked)  Constitutional: [] Weight loss  [] Fever  [] Chills Cardiac: [] Chest pain   [] Chest pressure   [] Palpitations   [] Shortness of breath when laying flat   [] Shortness of breath with exertion. Vascular:  [] Pain in legs with walking   [] Pain in legs at rest  [] History of DVT   [] Phlebitis   [] Swelling in legs   [] Varicose veins   [] Non-healing  ulcers Pulmonary:   [] Uses home oxygen   [] Productive cough   [] Hemoptysis   [] Wheeze  [] COPD   [] Asthma Neurologic:  [] Dizziness   [] Seizures   [] History of stroke   [] History of TIA  [] Aphasia   [] Vissual changes   [] Weakness or numbness in arm   [] Weakness or numbness in leg Musculoskeletal:   [] Joint swelling   [] Joint pain   [] Low back pain Hematologic:  [] Easy bruising  [] Easy bleeding   [] Hypercoagulable state   [] Anemic Gastrointestinal:  [] Diarrhea   [] Vomiting  [] Gastroesophageal reflux/heartburn   [] Difficulty swallowing. Genitourinary:  [] Chronic kidney disease   [] Difficult urination  [] Frequent urination   [] Blood in urine Skin:  [] Rashes   [] Ulcers  Psychological:  [] History of anxiety   []  History of major depression.  Physical Examination  Vitals:   01/07/20 1621 01/07/20 1636 01/07/20 1651 01/07/20 1708  BP: 127/80 134/75 (!) 141/76 129/63  Pulse: 80 80 80 81  Resp: 13 20 18 18   Temp:   98.4 F (36.9 C)   TempSrc:   Temporal   SpO2: 99% 98% 96% 98%   There is no height or weight on file to calculate BMI. Gen: WD/WN, NAD Head: [place]/AT, No temporalis wasting.  Ear/Nose/Throat: Hearing grossly intact, nares w/o erythema or drainage Eyes: PER, EOMI, sclera nonicteric.  Neck: Supple, no large masses.   Pulmonary:  Good air movement, no audible wheezing bilaterally, no use of accessory muscles.  Cardiac: RRR, no JVD Vascular: 3 cm venous aneurysm left antecubital fossa tender to palpation Vessel Right Left  Radial Palpable Palpable  Brachial Palpable Palpable  Gastrointestinal: Non-distended. No guarding/no peritoneal signs.  Musculoskeletal: M/S 5/5 throughout.  No deformity or atrophy.  Neurologic: CN 2-12 intact. Symmetrical.  Speech is fluent. Motor exam as listed above. Psychiatric: Judgment intact, Mood & affect appropriate for pt's clinical situation. Dermatologic: No rashes or ulcers noted.  No changes consistent with cellulitis.  CBC Lab Results  Component  Value Date   WBC 4.9 01/05/2020   HGB 11.9 (L) 01/05/2020   HCT 34.6 (L) 01/05/2020   MCV 86.7 01/05/2020   PLT 147 (L) 01/05/2020    BMET    Component Value Date/Time   NA 141 01/05/2020 0818   NA 142 11/04/2019 0932   K 3.6 01/05/2020 0818   CL 106 01/05/2020 0818   CO2 28 01/05/2020 0818   GLUCOSE 90 01/05/2020 0818   BUN 15 01/05/2020 0818   BUN 12 11/04/2019 0932   CREATININE 0.54 01/05/2020 0818   CALCIUM 8.2 (L) 01/05/2020 0818   GFRNONAA >60 01/05/2020 0818   GFRAA >60 01/05/2020 0818  CrCl cannot be calculated (Unknown ideal weight.).  COAG Lab Results  Component Value Date   INR 0.9 01/05/2020    Radiology No results found.    Assessment/Plan 1. Venous aneurysm The patient has a moderately symptomatic venous aneurysm of the left antecubital fossa.  I discussed with her that this is not a life or limb threatening condition.  I also discussed excision if she so chooses.    In spite of conservative therapies as noted above she is continued to have irritating symptoms and there is a remains concern regarding rupture she is therefore elected to move forward with excision.  The risks and benefits of been reviewed all questions answered patient agrees to proceed with excision of venous aneurysm left antecubital fossa.  2. Mixed hyperlipidemia Continue statin as ordered and reviewed, no changes at this time   3. Heart burn Continue PPI as already ordered, this medication has been reviewed and there are no changes at this time.  Avoidence of caffeine and alcohol  Moderate elevation of the head of the bed    [name], MD  01/16/2020 4:50 PM

## 2020-01-22 ENCOUNTER — Other Ambulatory Visit: Payer: Self-pay

## 2020-01-22 ENCOUNTER — Ambulatory Visit (INDEPENDENT_AMBULATORY_CARE_PROVIDER_SITE_OTHER): Payer: Medicare PPO | Admitting: Vascular Surgery

## 2020-01-22 ENCOUNTER — Encounter (INDEPENDENT_AMBULATORY_CARE_PROVIDER_SITE_OTHER): Payer: Self-pay | Admitting: Vascular Surgery

## 2020-01-22 VITALS — BP 126/79 | HR 94 | Resp 16 | Wt 214.0 lb

## 2020-01-22 DIAGNOSIS — I868 Varicose veins of other specified sites: Secondary | ICD-10-CM

## 2020-01-22 NOTE — Progress Notes (Signed)
Patient ID: Patricia Ferguson, female   DOB: [birth year], 69 y.o.   MRN: [id number]  Chief Complaint  Patient presents with   Follow-up    [facility] 2week follow up    HPI Patricia Ferguson is a 69 y.o. female.    No pain at the incision site  Pathology is venous aneurysm   Past Medical History:  Diagnosis Date   Allergy    Arthritis    right knee   FH: colonic polyps    GERD (gastroesophageal reflux disease)    Hypercholesterolemia    Internal hemorrhoids    Obesity    Osteopenia    Venous aneurysm 11/2019   left antecubital   Walking pneumonia     Past Surgical History:  Procedure Laterality Date   ABDOMINAL HYSTERECTOMY     ANAL FISSURE REPAIR  1980's   APPENDECTOMY     BACK SURGERY  2000   lumbar fusion. SCREWS IN BACK   bone spur     BREAST BIOPSY Right 1980's   BREAST SURGERY     CERVICAL CONE BIOPSY     lumbar fission     MASS EXCISION Left 01/07/2020   Procedure: EXCISION MASS (RESECTION OF ANTECUBITAL VENOUS ANEURYSM);  Surgeon: Katha Cabal, MD;  Location: [facility] ORS;  Service: Vascular;  Laterality: Left;      Allergies  Allergen Reactions   Adhesive [Tape] Rash    PAPER TAPE OKAY   Lipitor [Atorvastatin] Other (See Comments)    Muscle aches   Oxycodone Rash   Percodan [Oxycodone-Aspirin] Rash   Pravachol [Pravastatin Sodium] Other (See Comments)    Muscle aches    Propoxyphene Other (See Comments)    depression    Current Outpatient Medications  Medication Sig Dispense Refill   acetaminophen (TYLENOL) 500 MG tablet Take 500 mg by mouth every 6 (six) hours as needed.     ezetimibe (ZETIA) 10 MG tablet Take 1 tablet (10 mg total) by mouth daily. (Patient taking differently: Take 10 mg by mouth daily. Takes around 4 pm each day) 90 tablet 3   famotidine (PEPCID) 20 MG tablet Take 1 tablet (20 mg total) by mouth daily. 90 tablet 3   hydroxypropyl methylcellulose / hypromellose (ISOPTO TEARS / GONIOVISC) 2.5 %  ophthalmic solution Place 1 drop into both eyes daily.     Magnesium 500 MG CAPS Take 500 mg by mouth daily. Takes in the evening.  Uses for leg cramps     raloxifene (EVISTA) 60 MG tablet Take 1 tablet (60 mg total) by mouth daily. (Patient taking differently: Take 60 mg by mouth daily. Takes around 4 pm each day.) 90 tablet 3   traMADol (ULTRAM) 50 MG tablet Take 1 tablet (50 mg total) by mouth every 6 (six) hours as needed for moderate pain or severe pain. 20 tablet 0   No current facility-administered medications for this visit.        Physical Exam BP 126/79 (BP Location: Right Arm)    Pulse 94    Resp 16    Wt 214 lb (97.1 kg)    LMP  (LMP Unknown)    BMI 36.73 kg/m  Gen:  WD/WN, NAD Skin: incision C/D/I     Assessment/Plan: 1. Venous aneurysm S/p successful excision of venous aneurysm  Follow up prn      Patricia Ferguson 01/25/2020, 12:43 PM   This note was created with Dragon medical transcription system.  Any errors from dictation are unintentional.

## 2020-01-25 ENCOUNTER — Encounter (INDEPENDENT_AMBULATORY_CARE_PROVIDER_SITE_OTHER): Payer: Self-pay | Admitting: Vascular Surgery

## 2020-02-26 ENCOUNTER — Telehealth: Payer: Self-pay | Admitting: Nurse Practitioner

## 2020-02-26 NOTE — Telephone Encounter (Signed)
Copied from [place] [id number]. Topic: Medicare AWV >> Feb 26, 2020  2:25 PM Cher Nakai R wrote: Reason for CRM:  Left message for patient to call back and schedule the Medicare Annual Wellness Visit (AWV) virtually.  Last AWV 03/05/2019  Please schedule at anytime with [facility] Health Advisor.  45 minute appointment  Any questions, please call me at [phone]

## 2020-03-15 ENCOUNTER — Ambulatory Visit (INDEPENDENT_AMBULATORY_CARE_PROVIDER_SITE_OTHER): Payer: Medicare PPO

## 2020-03-15 VITALS — Ht 64.0 in | Wt 213.0 lb

## 2020-03-15 DIAGNOSIS — Z0000 Encounter for general adult medical examination without abnormal findings: Secondary | ICD-10-CM | POA: Diagnosis not present

## 2020-03-15 NOTE — Patient Instructions (Signed)
Patricia Ferguson , Thank you for taking time to come for your Medicare Wellness Visit. I appreciate your ongoing commitment to your health goals. Please review the following plan we discussed and let me know if I can assist you in the future.   Screening recommendations/referrals: Colonoscopy: completed 04/11/2012 Mammogram: completed 07/15/2019 Bone Density: completed 07/11/2018 Recommended yearly ophthalmology/optometry visit for glaucoma screening and checkup Recommended yearly dental visit for hygiene and checkup  Vaccinations: Influenza vaccine: due Pneumococcal vaccine: completed 03/05/2019 Tdap vaccine: due Shingles vaccine: completed   Covid-19: 09/29/2019, 09/08/2019  Advanced directives: Advance directive discussed with you today.   Conditions/risks identified: none  Next appointment: Follow up in one year for your annual wellness visit    Preventive Care 65 Years and Older, Female Preventive care refers to lifestyle choices and visits with your health care provider that can promote health and wellness. What does preventive care include?  A yearly physical exam. This is also called an annual well check.  Dental exams once or twice a year.  Routine eye exams. Ask your health care provider how often you should have your eyes checked.  Personal lifestyle choices, including:  Daily care of your teeth and gums.  Regular physical activity.  Eating a healthy diet.  Avoiding tobacco and drug use.  Limiting alcohol use.  Practicing safe sex.  Taking low-dose aspirin every day.  Taking vitamin and mineral supplements as recommended by your health care provider. What happens during an annual well check? The services and screenings done by your health care provider during your annual well check will depend on your age, overall health, lifestyle risk factors, and family history of disease. Counseling  Your health care provider may ask you questions about your:  Alcohol  use.  Tobacco use.  Drug use.  Emotional well-being.  Home and relationship well-being.  Sexual activity.  Eating habits.  History of falls.  Memory and ability to understand (cognition).  Work and work Statistician.  Reproductive health. Screening  You may have the following tests or measurements:  Height, weight, and BMI.  Blood pressure.  Lipid and cholesterol levels. These may be checked every 5 years, or more frequently if you are over 51 years old.  Skin check.  Lung cancer screening. You may have this screening every year starting at age 54 if you have a 30-pack-year history of smoking and currently smoke or have quit within the past 15 years.  Fecal occult blood test (FOBT) of the stool. You may have this test every year starting at age 22.  Flexible sigmoidoscopy or colonoscopy. You may have a sigmoidoscopy every 5 years or a colonoscopy every 10 years starting at age 48.  Hepatitis C blood test.  Hepatitis B blood test.  Sexually transmitted disease (STD) testing.  Diabetes screening. This is done by checking your blood sugar (glucose) after you have not eaten for a while (fasting). You may have this done every 1-3 years.  Bone density scan. This is done to screen for osteoporosis. You may have this done starting at age 62.  Mammogram. This may be done every 1-2 years. Talk to your health care provider about how often you should have regular mammograms. Talk with your health care provider about your test results, treatment options, and if necessary, the need for more tests. Vaccines  Your health care provider may recommend certain vaccines, such as:  Influenza vaccine. This is recommended every year.  Tetanus, diphtheria, and acellular pertussis (Tdap, Td) vaccine. You may need  a Td booster every 10 years.  Zoster vaccine. You may need this after age 50.  Pneumococcal 13-valent conjugate (PCV13) vaccine. One dose is recommended after age  44.  Pneumococcal polysaccharide (PPSV23) vaccine. One dose is recommended after age 87. Talk to your health care provider about which screenings and vaccines you need and how often you need them. This information is not intended to replace advice given to you by your health care provider. Make sure you discuss any questions you have with your health care provider. Document Released: 08/06/2015 Document Revised: 03/29/2016 Document Reviewed: 05/11/2015 Elsevier Interactive Patient Education  2017 [place] Prevention in the Home Falls can cause injuries. They can happen to people of all ages. There are many things you can do to make your home safe and to help prevent falls. What can I do on the outside of my home?  Regularly fix the edges of walkways and driveways and fix any cracks.  Remove anything that might make you trip as you walk through a door, such as a raised step or threshold.  Trim any bushes or trees on the path to your home.  Use bright outdoor lighting.  Clear any walking paths of anything that might make someone trip, such as rocks or tools.  Regularly check to see if handrails are loose or broken. Make sure that both sides of any steps have handrails.  Any raised decks and porches should have guardrails on the edges.  Have any leaves, snow, or ice cleared regularly.  Use sand or salt on walking paths during winter.  Clean up any spills in your garage right away. This includes oil or grease spills. What can I do in the bathroom?  Use night lights.  Install grab bars by the toilet and in the tub and shower. Do not use towel bars as grab bars.  Use non-skid mats or decals in the tub or shower.  If you need to sit down in the shower, use a plastic, non-slip stool.  Keep the floor dry. Clean up any water that spills on the floor as soon as it happens.  Remove soap buildup in the tub or shower regularly.  Attach bath mats securely with double-sided  non-slip rug tape.  Do not have throw rugs and other things on the floor that can make you trip. What can I do in the bedroom?  Use night lights.  Make sure that you have a light by your bed that is easy to reach.  Do not use any sheets or blankets that are too big for your bed. They should not hang down onto the floor.  Have a firm chair that has side arms. You can use this for support while you get dressed.  Do not have throw rugs and other things on the floor that can make you trip. What can I do in the kitchen?  Clean up any spills right away.  Avoid walking on wet floors.  Keep items that you use a lot in easy-to-reach places.  If you need to reach something above you, use a strong step stool that has a grab bar.  Keep electrical cords out of the way.  Do not use floor polish or wax that makes floors slippery. If you must use wax, use non-skid floor wax.  Do not have throw rugs and other things on the floor that can make you trip. What can I do with my stairs?  Do not leave any items on the  stairs.  Make sure that there are handrails on both sides of the stairs and use them. Fix handrails that are broken or loose. Make sure that handrails are as long as the stairways.  Check any carpeting to make sure that it is firmly attached to the stairs. Fix any carpet that is loose or worn.  Avoid having throw rugs at the top or bottom of the stairs. If you do have throw rugs, attach them to the floor with carpet tape.  Make sure that you have a light switch at the top of the stairs and the bottom of the stairs. If you do not have them, ask someone to add them for you. What else can I do to help prevent falls?  Wear shoes that:  Do not have high heels.  Have rubber bottoms.  Are comfortable and fit you well.  Are closed at the toe. Do not wear sandals.  If you use a stepladder:  Make sure that it is fully opened. Do not climb a closed stepladder.  Make sure that both  sides of the stepladder are locked into place.  Ask someone to hold it for you, if possible.  Clearly mark and make sure that you can see:  Any grab bars or handrails.  First and last steps.  Where the edge of each step is.  Use tools that help you move around (mobility aids) if they are needed. These include:  Canes.  Walkers.  Scooters.  Crutches.  Turn on the lights when you go into a dark area. Replace any light bulbs as soon as they burn out.  Set up your furniture so you have a clear path. Avoid moving your furniture around.  If any of your floors are uneven, fix them.  If there are any pets around you, be aware of where they are.  Review your medicines with your doctor. Some medicines can make you feel dizzy. This can increase your chance of falling. Ask your doctor what other things that you can do to help prevent falls. This information is not intended to replace advice given to you by your health care provider. Make sure you discuss any questions you have with your health care provider. Document Released: 05/06/2009 Document Revised: 12/16/2015 Document Reviewed: 08/14/2014 Elsevier Interactive Patient Education  2017 [employer].

## 2020-03-15 NOTE — Progress Notes (Signed)
I connected with Patricia Ferguson today by telephone and verified that I am speaking with the correct person using two identifiers. Location patient: home Location provider: work Persons participating in the virtual visit: [place]ah Filippa, [place] LPN.   I discussed the limitations, risks, security and privacy concerns of performing an evaluation and management service by telephone and the availability of in person appointments. I also discussed with the patient that there may be a patient responsible charge related to this service. The patient expressed understanding and verbally consented to this telephonic visit.    Interactive audio and video telecommunications were attempted between this provider and patient, however failed, due to patient having technical difficulties OR patient did not have access to video capability.  We continued and completed visit with audio only.    Vital signs may be patient reported or missing. s  Subjective:   Patricia Ferguson is a 69 y.o. female who presents for Medicare Annual (Subsequent) preventive examination.  Review of Systems     Cardiac Risk Factors include: advanced age (>81men, >5 women);dyslipidemia;obesity (BMI >30kg/m2);sedentary lifestyle     Objective:    Today's Vitals   03/15/20 0856  Weight: 213 lb (96.6 kg)  Height: 5\' 4"  (1.626 m)   Body mass index is 36.56 kg/m.  Advanced Directives 03/15/2020 01/07/2020 12/31/2019 03/05/2019 02/25/2018 02/23/2017  Does Patient Have a Medical Advance Directive? No No No No No No  Would patient like information on creating a medical advance directive? - No - Patient declined Yes (MAU/Ambulatory/Procedural Areas - Information given) - Yes (MAU/Ambulatory/Procedural Areas - Information given) No - Patient declined    Current Medications (verified) Outpatient Encounter Medications as of 03/15/2020  Medication Sig  . acetaminophen (TYLENOL) 500 MG tablet Take 500 mg by mouth every 6 (six) hours as needed.    . ezetimibe (ZETIA) 10 MG tablet Take 1 tablet (10 mg total) by mouth daily. (Patient taking differently: Take 10 mg by mouth daily. Takes around 4 pm each day)  . famotidine (PEPCID) 20 MG tablet Take 1 tablet (20 mg total) by mouth daily.  . hydroxypropyl methylcellulose / hypromellose (ISOPTO TEARS / GONIOVISC) 2.5 % ophthalmic solution Place 1 drop into both eyes daily.  . Magnesium 500 MG CAPS Take 500 mg by mouth daily. Takes in the evening.  Uses for leg cramps  . raloxifene (EVISTA) 60 MG tablet Take 1 tablet (60 mg total) by mouth daily. (Patient taking differently: Take 60 mg by mouth daily. Takes around 4 pm each day.)  . traMADol (ULTRAM) 50 MG tablet Take 1 tablet (50 mg total) by mouth every 6 (six) hours as needed for moderate pain or severe pain.   No facility-administered encounter medications on file as of 03/15/2020.    Allergies (verified) Adhesive [tape], Lipitor [atorvastatin], Oxycodone, Percodan [oxycodone-aspirin], Pravachol [pravastatin sodium], and Propoxyphene   History: Past Medical History:  Diagnosis Date  . Allergy   . Arthritis    right knee  . FH: colonic polyps   . GERD (gastroesophageal reflux disease)   . Hypercholesterolemia   . Internal hemorrhoids   . Obesity   . Osteopenia   . 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 Walking pneumonia    Past Surgical History:  Procedure Laterality Date  . ABDOMINAL HYSTERECTOMY    . ANAL FISSURE REPAIR  1980's  . APPENDECTOMY    . BACK SURGERY  2000   lumbar fusion. SCREWS IN BACK  . bone spur    .  BREAST BIOPSY Right 1980's  . BREAST SURGERY    . CERVICAL CONE BIOPSY    . lumbar fission    . MASS EXCISION Left 01/07/2020   Procedure: EXCISION MASS (RESECTION OF ANTECUBITAL VENOUS ANEURYSM);  Surgeon: Katha Cabal, MD;  Location: [facility] ORS;  Service: Vascular;  Laterality: Left;   Family History  Problem Relation Age of Onset  . Diabetes Mother   . Hyperlipidemia Mother   .  Hypertension Mother   . Osteoporosis Mother   . Cancer Father        lung  . Diabetes Father   . Diabetes Sister   . Hyperlipidemia Sister   . Hypertension Sister   . Hypertension Brother   . Hyperlipidemia Sister   . Breast cancer Neg Hx    Social History   Socioeconomic History  . Marital status: Married    Spouse name: Jeneen Rinks  . Number of children: Not on file  . Years of education: some college   . Highest education level: High school graduate  Occupational History  . Occupation: [employer] part time     Comment: Administrator  Tobacco Use  . Smoking status: Never Smoker  . Smokeless tobacco: Never Used  Vaping Use  . Vaping Use: Never used  Substance and Sexual Activity  . Alcohol use: Not Currently    Alcohol/week: 0.0 standard drinks    Comment: on occasion  . Drug use: No  . Sexual activity: Not Currently  Other Topics Concern  . Not on file  Social History Narrative   Working part time .    Lives with husband.   Social Determinants of Health   Financial Resource Strain: Low Risk   . Difficulty of Paying Living Expenses: Not hard at all  Food Insecurity: No Food Insecurity  . Worried About Charity fundraiser in the Last Year: Never true  . Ran Out of Food in the Last Year: Never true  Transportation Needs: No Transportation Needs  . Lack of Transportation (Medical): No  . Lack of Transportation (Non-Medical): No  Physical Activity: Inactive  . Days of Exercise per Week: 0 days  . Minutes of Exercise per Session: 0 min  Stress: Stress Concern Present  . Feeling of Stress : To some extent  Social Connections:   . Frequency of Communication with Friends and Family: Not on file  . Frequency of Social Gatherings with Friends and Family: Not on file  . Attends Religious Services: Not on file  . Active Member of Clubs or Organizations: Not on file  . Attends Archivist Meetings: Not on file  . Marital Status: Not on file    Tobacco  Counseling Counseling given: Not Answered   Clinical Intake:  Pre-visit preparation completed: Yes  Pain : No/denies pain     Nutritional Status: BMI > 30  Obese Nutritional Risks: None Diabetes: No  How often do you need to have someone help you when you read instructions, pamphlets, or other written materials from your doctor or pharmacy?: 1 - Never What is the last grade level you completed in school?: 12th grade  Diabetic? no  Interpreter Needed?: No  Information entered by :: NAllen LPN   Activities of Daily Living In your present state of health, do you have any difficulty performing the following activities: 03/15/2020 12/31/2019  Hearing? N N  Vision? N N  Difficulty concentrating or making decisions? N N  Walking or climbing stairs? N N  Dressing or  bathing? N N  Doing errands, shopping? N N  Preparing Food and eating ? N -  Using the Toilet? N -  In the past six months, have you accidently leaked urine? Y -  Do you have problems with loss of bowel control? N -  Managing your Medications? N -  Managing your Finances? N -  Housekeeping or managing your Housekeeping? N -  Some recent data might be hidden    Patient Care Team: Venita Lick, NP as PCP - General (Nurse Practitioner)  Indicate any recent Medical Services you may have received from other than [facility] providers in the past year (date may be approximate).     Assessment:   This is a routine wellness examination for Calianne.  Hearing/Vision screen  Hearing Screening   125Hz  250Hz  500Hz  1000Hz  2000Hz  3000Hz  4000Hz  6000Hz  8000Hz   Right ear:           Left ear:           Vision Screening Comments: Regular eye exams, [facility]  Dietary issues and exercise activities discussed: Current Exercise Habits: The patient does not participate in regular exercise at present  Goals    . DIET - INCREASE WATER INTAKE     Recommend drinking at least 6-8 glasses of water a day     . Patient  Stated     03/15/2020, exercise more      Depression Screen PHQ 2/9 Scores 03/15/2020 03/05/2019 02/25/2018 08/28/2017 02/23/2017 02/21/2016  PHQ - 2 Score 0 0 0 0 0 0  PHQ- 9 Score - - - - 0 -    Fall Risk Fall Risk  03/15/2020 03/05/2019 02/25/2018 08/28/2017 02/21/2016  Falls in the past year? 0 0 No No No  Risk for fall due to : Medication side effect - - - -  Follow up Falls evaluation completed;Education provided;Falls prevention discussed - - - -    Any stairs in or around the home? Yes  If so, are there any without handrails? No  Home free of loose throw rugs in walkways, pet beds, electrical cords, etc? Yes  Adequate lighting in your home to reduce risk of falls? Yes   ASSISTIVE DEVICES UTILIZED TO PREVENT FALLS:  Life alert? No  Use of a cane, walker or w/c? No  Grab bars in the bathroom? Yes  Shower chair or bench in shower? No  Elevated toilet seat or a handicapped toilet? No   TIMED UP AND GO:  Was the test performed? No .     Cognitive Function:     6CIT Screen 03/15/2020 02/25/2018  What Year? 0 points 0 points  What month? 0 points 0 points  What time? 0 points 0 points  Count back from 20 0 points 0 points  Months in reverse 0 points 0 points  Repeat phrase 0 points 0 points  Total Score 0 0    Immunizations Immunization History  Administered Date(s) Administered  . Influenza, High Dose Seasonal PF 05/02/2019  . Influenza-Unspecified 04/21/2016, 04/16/2017, 04/07/2018  . PFIZER SARS-COV-2 Vaccination 09/08/2019, 09/29/2019  . Pneumococcal Conjugate-13 08/28/2017  . Pneumococcal Polysaccharide-23 03/05/2019  . Td 07/24/2004, 09/17/2009  . Zoster 10/25/2012  . Zoster Recombinat (Shingrix) 05/02/2019    TDAP status: Due, Education has been provided regarding the importance of this vaccine. Advised may receive this vaccine at local pharmacy or Health Dept. Aware to provide a copy of the vaccination record if obtained from local pharmacy or Health Dept. Verbalized  acceptance  and understanding. Flu Vaccine status: Up to date Pneumococcal vaccine status: Up to date Covid-19 vaccine status: Completed vaccines  Qualifies for Shingles Vaccine? Yes   Zostavax completed Yes   Shingrix Completed?: Yes  Screening Tests Health Maintenance  Topic Date Due  . TETANUS/TDAP  09/18/2019  . INFLUENZA VACCINE  02/22/2020  . MAMMOGRAM  07/14/2021  . COLONOSCOPY  04/11/2022  . DEXA SCAN  Completed  . COVID-19 Vaccine  Completed  . Hepatitis C Screening  Completed  . PNA vac Low Risk Adult  Completed    Health Maintenance  Health Maintenance Due  Topic Date Due  . TETANUS/TDAP  09/18/2019  . INFLUENZA VACCINE  02/22/2020    Colorectal cancer screening: Completed 04/11/2012. Repeat every 10 years Mammogram status: Completed 07/15/2019. Repeat every year Bone Density status: Completed 07/11/2018.   Lung Cancer Screening: (Low Dose CT Chest recommended if Age 80-80 years, 30 pack-year currently smoking OR have quit w/in 15years.) does not qualify.   Lung Cancer Screening Referral: no  Additional Screening:  Hepatitis C Screening: does qualify; Completed 02/21/2016  Vision Screening: Recommended annual ophthalmology exams for early detection of glaucoma and other disorders of the eye. Is the patient up to date with their annual eye exam?  Yes  Who is the provider or what is the name of the office in which the patient attends annual eye exams? [facility] If pt is not established with a provider, would they like to be referred to a provider to establish care? No .   Dental Screening: Recommended annual dental exams for proper oral hygiene  Community Resource Referral / Chronic Care Management: CRR required this visit?  No   CCM required this visit?  No      Plan:     I have personally reviewed and noted the following in the patient's chart:   . Medical and social history . Use of alcohol, tobacco or illicit drugs  . Current  medications and supplements . Functional ability and status . Nutritional status . Physical activity . Advanced directives . List of other physicians . Hospitalizations, surgeries, and ER visits in previous 12 months . Vitals . Screenings to include cognitive, depression, and falls . Referrals and appointments  In addition, I have reviewed and discussed with patient certain preventive protocols, quality metrics, and best practice recommendations. A written personalized care plan for preventive services as well as general preventive health recommendations were provided to patient.     Kellie Simmering, LPN   [id number]   Nurse Notes:

## 2020-05-05 ENCOUNTER — Encounter: Payer: Medicare PPO | Admitting: Nurse Practitioner

## 2020-05-05 ENCOUNTER — Ambulatory Visit (INDEPENDENT_AMBULATORY_CARE_PROVIDER_SITE_OTHER): Payer: Medicare PPO | Admitting: Nurse Practitioner

## 2020-05-05 ENCOUNTER — Encounter: Payer: Self-pay | Admitting: Nurse Practitioner

## 2020-05-05 ENCOUNTER — Other Ambulatory Visit: Payer: Self-pay

## 2020-05-05 VITALS — BP 110/64 | HR 82 | Temp 98.3°F | Resp 16 | Ht 64.5 in | Wt 210.0 lb

## 2020-05-05 DIAGNOSIS — M8588 Other specified disorders of bone density and structure, other site: Secondary | ICD-10-CM

## 2020-05-05 DIAGNOSIS — Z1231 Encounter for screening mammogram for malignant neoplasm of breast: Secondary | ICD-10-CM | POA: Diagnosis not present

## 2020-05-05 DIAGNOSIS — M791 Myalgia, unspecified site: Secondary | ICD-10-CM | POA: Insufficient documentation

## 2020-05-05 DIAGNOSIS — Z6835 Body mass index (BMI) 35.0-35.9, adult: Secondary | ICD-10-CM

## 2020-05-05 DIAGNOSIS — R12 Heartburn: Secondary | ICD-10-CM

## 2020-05-05 DIAGNOSIS — E6609 Other obesity due to excess calories: Secondary | ICD-10-CM

## 2020-05-05 DIAGNOSIS — Z0000 Encounter for general adult medical examination without abnormal findings: Secondary | ICD-10-CM | POA: Diagnosis not present

## 2020-05-05 DIAGNOSIS — E782 Mixed hyperlipidemia: Secondary | ICD-10-CM | POA: Diagnosis not present

## 2020-05-05 DIAGNOSIS — T466X5A Adverse effect of antihyperlipidemic and antiarteriosclerotic drugs, initial encounter: Secondary | ICD-10-CM

## 2020-05-05 NOTE — Patient Instructions (Signed)
Your bone density shows thinning bones (osteopenia) but not brittle (osteoporosis).  We recommend Vitamin D supplementation of about 2,0000 IUs of over the counter Vitamin D3.  In addition, we recommend a diet high in calcium with dairy and dark green leafy vegetables.  We would like you to get plenty of weight bearing exercises with walking and resistance training such as light weights or resistance bands available with instructions at places such as [employer].     Osteopenia  Osteopenia is a loss of thickness (density) inside of the bones. Another name for osteopenia is low bone mass. Mild osteopenia is a normal part of aging. It is not a disease, and it does not cause symptoms. However, if you have osteopenia and continue to lose bone mass, you could develop a condition that causes the bones to become thin and break more easily (osteoporosis). You may also lose some height, have back pain, and have a stooped posture. Although osteopenia is not a disease, making changes to your lifestyle and diet can help to prevent osteopenia from developing into osteoporosis. What are the causes? Osteopenia is caused by loss of calcium in the bones.  Bones are constantly changing. Old bone cells are continually being replaced with new bone cells. This process builds new bone. The mineral calcium is needed to build new bone and maintain bone density. Bone density is usually highest around age 32. After that, most people's bodies cannot replace all the bone they have lost with new bone. What increases the risk? You are more likely to develop this condition if:  You are older than age 33.  You are a woman who went through menopause early.  You have a long illness that keeps you in bed.  You do not get enough exercise.  You lack certain nutrients (malnutrition).  You have an overactive thyroid gland (hyperthyroidism).  You smoke.  You drink a lot of alcohol.  You are taking medicines that weaken the bones,  such as steroids. What are the signs or symptoms? This condition does not cause any symptoms. You may have a slightly higher risk for bone breaks (fractures), so getting fractures more easily than normal may be an indication of osteopenia. How is this diagnosed? Your health care provider can diagnose this condition with a special type of X-ray exam that measures bone density (dual-energy X-ray absorptiometry, DEXA). This test can measure bone density in your hips, spine, and wrists. Osteopenia has no symptoms, so this condition is usually diagnosed after a routine bone density screening test is done for osteoporosis. This routine screening is usually done for:  Women who are age 33 or older.  Men who are age 44 or older. If you have risk factors for osteopenia, you may have the screening test at an earlier age. How is this treated? Making dietary and lifestyle changes can lower your risk for osteoporosis. If you have severe osteopenia that is close to becoming osteoporosis, your health care provider may prescribe medicines and dietary supplements such as calcium and vitamin D. These supplements help to rebuild bone density. Follow these instructions at home:   Take over-the-counter and prescription medicines only as told by your health care provider. These include vitamins and supplements.  Eat a diet that is high in calcium and vitamin D. ? Calcium is found in dairy products, beans, salmon, and leafy green vegetables like spinach and broccoli. ? Look for foods that have vitamin D and calcium added to them (fortified foods), such as orange juice,  cereal, and bread.  Do 30 or more minutes of a weight-bearing exercise every day, such as walking, jogging, or playing a sport. These types of exercises strengthen the bones.  Take precautions at home to lower your risk of falling, such as: ? Keeping rooms well-lit and free of clutter, such as cords. ? Installing safety rails on stairs. ? Using  rubber mats in the bathroom or other areas that are often wet or slippery.  Do not use any products that contain nicotine or tobacco, such as cigarettes and e-cigarettes. If you need help quitting, ask your health care provider.  Avoid alcohol or limit alcohol intake to no more than 1 drink a day for nonpregnant women and 2 drinks a day for men. One drink equals 12 oz of beer, 5 oz of wine, or 1 oz of hard liquor.  Keep all follow-up visits as told by your health care provider. This is important. Contact a health care provider if:  You have not had a bone density screening for osteoporosis and you are: ? A woman, age 53 or older. ? A man, age 46 or older.  You are a postmenopausal woman who has not had a bone density screening for osteoporosis.  You are older than age 27 and you want to know if you should have bone density screening for osteoporosis. Summary  Osteopenia is a loss of thickness (density) inside of the bones. Another name for osteopenia is low bone mass.  Osteopenia is not a disease, but it may increase your risk for a condition that causes the bones to become thin and break more easily (osteoporosis).  You may be at risk for osteopenia if you are older than age 60 or if you are a woman who went through early menopause.  Osteopenia does not cause any symptoms, but it can be diagnosed with a bone density screening test.  Dietary and lifestyle changes are the first treatment for osteopenia. These may lower your risk for osteoporosis. This information is not intended to replace advice given to you by your health care provider. Make sure you discuss any questions you have with your health care provider. Document Revised: 06/22/2017 Document Reviewed: 04/18/2017 Elsevier Patient Education  2020 [employer].

## 2020-05-05 NOTE — Assessment & Plan Note (Signed)
Ongoing and improved with Pepcid.  Will continue current medication regimen and adjust as needed.  Labs today.

## 2020-05-05 NOTE — Assessment & Plan Note (Signed)
BMI 35.49.  Recommended eating smaller high protein, low fat meals more frequently and exercising 30 mins a day 5 times a week with a goal of 10-15lb weight loss in the next 3 months. Patient voiced their understanding and motivation to adhere to these recommendations.

## 2020-05-05 NOTE — Assessment & Plan Note (Signed)
History of trial on various statins with side effect of myalgia, continue Zetia and could consider Repatha in future if elevations above goal.

## 2020-05-05 NOTE — Assessment & Plan Note (Signed)
Continue Evista and check Vitamin D level.  Consider holiday of Evista in future as recent DEXA in 2019 and 2014 noted osteopenia.  Repeat DEXA in 2024.

## 2020-05-05 NOTE — Progress Notes (Signed)
BP 110/64 (BP Location: Left Arm, Patient Position: Sitting, Cuff Size: Large)   Pulse 82   Temp 98.3 F (36.8 C) (Oral)   Resp 16   Ht 5' 4.5" (1.638 m)   Wt 210 lb (95.3 kg)   LMP  (LMP Unknown)   SpO2 100%   BMI 35.49 kg/m    Subjective:    Patient ID: Patricia Ferguson, female    DOB: June 05, 1951, 69 y.o.   MRN: [id number]  HPI: Patricia Ferguson is a 69 y.o. female presenting on 05/05/2020 for comprehensive medical examination. Current medical complaints include:none  She currently lives with: husband Menopausal Symptoms: no   HYPERLIPIDEMIA Continues on Zetia daily.  Has tried statin therapy in past and was intolerant, does not recall which ones she took.  We discussed trial of different statin or different statin regimen (3 days a week), which she reports she would try if continued elevation.    Continues on Pepcid with good control of GERD. Hyperlipidemia status: good compliance Satisfied with current treatment?  no Side effects:  no Medication compliance: good compliance Past cholesterol meds: atorvastain (lipitor), pravastatin (pravachol), rosuvastatin (crestor) and simvastatin (zocor) Supplements: red yeast rice Aspirin:  no The 10-year ASCVD risk score Mikey Bussing DC Jr., et al., 2013) is: 6%   Values used to calculate the score:     Age: 38 years     Sex: Female     Is Non-Hispanic African American: No     Diabetic: No     Tobacco smoker: No     Systolic Blood Pressure: 759 mmHg     Is BP treated: No     HDL Cholesterol: 46 mg/dL     Total Cholesterol: 196 mg/dL Chest pain:  no Coronary artery disease:  no Family history CAD:  no Family history early CAD:  no   OSTEOPENIA: Last DEXA in 2019 -- noting osteopenia.  She is taking Evista, has been on for a long while and does not recall if any scans showed osteoporosis.  Depression Screen done today and results listed below:  Depression screen [facility] 2/9 03/15/2020 03/05/2019 02/25/2018 08/28/2017 02/23/2017  Decreased  Interest 0 0 0 0 0  Down, Depressed, Hopeless 0 0 0 0 0  PHQ - 2 Score 0 0 0 0 0  Altered sleeping - - - - 0  Tired, decreased energy - - - - 0  Change in appetite - - - - 0  Feeling bad or failure about yourself  - - - - 0  Trouble concentrating - - - - 0  Moving slowly or fidgety/restless - - - - 0  Suicidal thoughts - - - - 0  PHQ-9 Score - - - - 0    The patient does not have a history of falls. I did not complete a risk assessment for falls. A plan of care for falls was not documented.   Past Medical History:  Past Medical History:  Diagnosis Date  . Allergy   . Arthritis    right knee  . FH: colonic polyps   . GERD (gastroesophageal reflux disease)   . Hypercholesterolemia   . Internal hemorrhoids   . Obesity   . Osteopenia   . 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 Walking pneumonia     Surgical History:  Past Surgical History:  Procedure Laterality Date  . ABDOMINAL HYSTERECTOMY    . ANAL FISSURE REPAIR  1980's  . APPENDECTOMY    . BACK  SURGERY  2000   lumbar fusion. SCREWS IN BACK  . bone spur    . BREAST BIOPSY Right 1980's  . BREAST SURGERY    . CERVICAL CONE BIOPSY    . lumbar fission    . MASS EXCISION Left 01/07/2020   Procedure: EXCISION MASS (RESECTION OF ANTECUBITAL VENOUS ANEURYSM);  Surgeon: Katha Cabal, MD;  Location: [facility] ORS;  Service: Vascular;  Laterality: Left;    Medications:  Current Outpatient Medications on File Prior to Visit  Medication Sig  . acetaminophen (TYLENOL) 500 MG tablet Take 500 mg by mouth every 6 (six) hours as needed.  . ezetimibe (ZETIA) 10 MG tablet Take 1 tablet (10 mg total) by mouth daily. (Patient taking differently: Take 10 mg by mouth daily. Takes around 4 pm each day)  . famotidine (PEPCID) 20 MG tablet Take 1 tablet (20 mg total) by mouth daily.  . hydroxypropyl methylcellulose / hypromellose (ISOPTO TEARS / GONIOVISC) 2.5 % ophthalmic solution Place 1 drop into both eyes daily.  . Magnesium  500 MG CAPS Take 500 mg by mouth daily. Takes in the evening.  Uses for leg cramps  . raloxifene (EVISTA) 60 MG tablet Take 1 tablet (60 mg total) by mouth daily. (Patient taking differently: Take 60 mg by mouth daily. Takes around 4 pm each day.)   No current facility-administered medications on file prior to visit.    Allergies:  Allergies  Allergen Reactions  . Adhesive [Tape] Rash    PAPER TAPE OKAY  . Lipitor [Atorvastatin] Other (See Comments)    Muscle aches  . Oxycodone Rash  . Percodan [Oxycodone-Aspirin] Rash  . Pravachol [Pravastatin Sodium] Other (See Comments)    Muscle aches   . Propoxyphene Other (See Comments)    depression    Social History:  Social History   Socioeconomic History  . Marital status: Married    Spouse name: Jeneen Rinks  . Number of children: Not on file  . Years of education: some college   . Highest education level: High school graduate  Occupational History  . Occupation: [employer] part time     Comment: Administrator  Tobacco Use  . Smoking status: Never Smoker  . Smokeless tobacco: Never Used  Vaping Use  . Vaping Use: Never used  Substance and Sexual Activity  . Alcohol use: Not Currently    Alcohol/week: 0.0 standard drinks    Comment: on occasion  . Drug use: No  . Sexual activity: Not Currently  Other Topics Concern  . Not on file  Social History Narrative   Working part time .    Lives with husband.   Social Determinants of Health   Financial Resource Strain: Low Risk   . Difficulty of Paying Living Expenses: Not hard at all  Food Insecurity: No Food Insecurity  . Worried About Charity fundraiser in the Last Year: Never true  . Ran Out of Food in the Last Year: Never true  Transportation Needs: No Transportation Needs  . Lack of Transportation (Medical): No  . Lack of Transportation (Non-Medical): No  Physical Activity: Inactive  . Days of Exercise per Week: 0 days  . Minutes of Exercise per Session: 0 min    Stress: Stress Concern Present  . Feeling of Stress : To some extent  Social Connections:   . Frequency of Communication with Friends and Family: Not on file  . Frequency of Social Gatherings with Friends and Family: Not on file  . Attends Religious  Services: Not on file  . Active Member of Clubs or Organizations: Not on file  . Attends Archivist Meetings: Not on file  . Marital Status: Not on file  Intimate Partner Violence:   . Fear of Current or Ex-Partner: Not on file  . Emotionally Abused: Not on file  . Physically Abused: Not on file  . Sexually Abused: Not on file   Social History   Tobacco Use  Smoking Status Never Smoker  Smokeless Tobacco Never Used   Social History   Substance and Sexual Activity  Alcohol Use Not Currently  . Alcohol/week: 0.0 standard drinks   Comment: on occasion    Family History:  Family History  Problem Relation Age of Onset  . Diabetes Mother   . Hyperlipidemia Mother   . Hypertension Mother   . Osteoporosis Mother   . Cancer Father        lung  . Diabetes Father   . Diabetes Sister   . Hyperlipidemia Sister   . Hypertension Sister   . Hypertension Brother   . Hyperlipidemia Sister   . Breast cancer Neg Hx     Past medical history, surgical history, medications, allergies, family history and social history reviewed with patient today and changes made to appropriate areas of the chart.   Review of Systems - negative All other ROS negative except what is listed above and in the HPI.      Objective:    BP 110/64 (BP Location: Left Arm, Patient Position: Sitting, Cuff Size: Large)   Pulse 82   Temp 98.3 F (36.8 C) (Oral)   Resp 16   Ht 5' 4.5" (1.638 m)   Wt 210 lb (95.3 kg)   LMP  (LMP Unknown)   SpO2 100%   BMI 35.49 kg/m   Wt Readings from Last 3 Encounters:  05/05/20 210 lb (95.3 kg)  03/15/20 213 lb (96.6 kg)  01/22/20 214 lb (97.1 kg)    Physical Exam Vitals and nursing note reviewed.   Constitutional:      General: She is awake. She is not in acute distress.    Appearance: She is well-developed and overweight. She is not ill-appearing.  HENT:     Head: Normocephalic.     Right Ear: Hearing, tympanic membrane, ear canal and external ear normal. No drainage.     Left Ear: Hearing, tympanic membrane, ear canal and external ear normal. No drainage.     Nose: Nose normal.     Mouth/Throat:     Mouth: Mucous membranes are moist.     Pharynx: Oropharynx is clear. Uvula midline.  Eyes:     General: Lids are normal.        Right eye: No discharge.        Left eye: No discharge.     Extraocular Movements: Extraocular movements intact.     Conjunctiva/sclera: Conjunctivae normal.     Pupils: Pupils are equal, round, and reactive to light.     Visual Fields: Right eye visual fields normal and left eye visual fields normal.  Neck:     Thyroid: No thyromegaly.     Vascular: No carotid bruit.  Cardiovascular:     Rate and Rhythm: Normal rate and regular rhythm.     Heart sounds: Normal heart sounds. No murmur heard.  No gallop.   Pulmonary:     Effort: Pulmonary effort is normal.     Breath sounds: Normal breath sounds.  Chest:  Breasts:        Right: Normal.        Left: Normal.  Abdominal:     General: Bowel sounds are normal.     Palpations: Abdomen is soft. There is no hepatomegaly or splenomegaly.     Tenderness: There is no abdominal tenderness.  Musculoskeletal:     Cervical back: Normal range of motion and neck supple.     Right lower leg: No edema.     Left lower leg: No edema.  Lymphadenopathy:     Cervical: No cervical adenopathy.     Upper Body:     Right upper body: No supraclavicular, axillary or pectoral adenopathy.     Left upper body: No supraclavicular, axillary or pectoral adenopathy.  Skin:    General: Skin is warm and dry.  Neurological:     Mental Status: She is alert and oriented to person, place, and time.     Cranial Nerves: Cranial  nerves are intact.     Gait: Gait is intact.     Deep Tendon Reflexes: Reflexes are normal and symmetric.     Reflex Scores:      Brachioradialis reflexes are 2+ on the right side and 2+ on the left side.      Patellar reflexes are 2+ on the right side and 2+ on the left side. Psychiatric:        Attention and Perception: Attention normal.        Mood and Affect: Mood normal.        Speech: Speech normal.        Behavior: Behavior normal. Behavior is cooperative.        Thought Content: Thought content normal.        Cognition and Memory: Cognition normal.        Judgment: Judgment normal.     Results for orders placed or performed during the hospital encounter of 01/07/20  ABO/Rh  Result Value Ref Range   ABO/RH(D)      A POS Performed at [facility], [address] [place], [place] [address]   Type and screen [place]  Result Value Ref Range   ABO/RH(D) A POS    Antibody Screen NEG    Sample Expiration      01/10/2020,2359 Performed at [facility], [address] [place], [place] [address]   Surgical pathology  Result Value Ref Range   SURGICAL PATHOLOGY      SURGICAL PATHOLOGY CASE: [id number] PATIENT: [employer] Surgical Pathology Report     Specimen Submitted: A. Aneurysm, left, venous anecubital fossa  Clinical History: Venous aneurysm      DIAGNOSIS: A. LEFT ANTECUBITAL FOSSA; VENOUS ANEURYSM; EXCISION: - BENIGN VASCULAR TISSUE. - NEGATIVE FOR MALIGNANCY.   GROSS DESCRIPTION: A. Labeled: Venous aneurysm, left antecubital fossa Received: Formalin Tissue fragment(s): 1 Size: 1.5 x 0.9 x 0.5 cm Description: Received is an irregular fragment of tan soft tissue.  At one aspect there are 2 black sutures, The specimen is serially sectioned to reveal a collapsed lumen. Entirely submitted in 1 cassette.    Final Diagnosis performed by Betsy Pries, MD.   Electronically signed 01/09/2020  9:45:33AM The electronic signature indicates that the named Attending Pathologist has evaluated the specimen Technical component performed at [facility], [address], [place], [place] [address] Lab: [phone] Dir: Alyson Reedy, MD, MMM  Professional component performed at [facility], [facility], [place], [place], [place] [address] Lab: [phone] Dir:  Dellia Nims Reuel Derby, MD       Assessment & Plan:   Problem List Items Addressed This Visit      Musculoskeletal and Integument   Osteopenia    Continue Evista and check Vitamin D level.  Consider holiday of Evista in future as recent DEXA in 2019 and 2014 noted osteopenia.  Repeat DEXA in 2024.      Relevant Orders   VITAMIN D 25 Hydroxy (Vit-D Deficiency, Fractures)     Other   Hyperlipidemia    Chronic, ongoing with poor statin tolerance in past. Continue current medication regimen, Zetia.  Lipid and CMP panel today.      Relevant Orders   Comprehensive metabolic panel   Lipid Panel w/o Chol/HDL Ratio   Obesity    BMI 35.49.  Recommended eating smaller high protein, low fat meals more frequently and exercising 30 mins a day 5 times a week with a goal of 10-15lb weight loss in the next 3 months. Patient voiced their understanding and motivation to adhere to these recommendations.       Heart burn    Ongoing and improved with Pepcid.  Will continue current medication regimen and adjust as needed.  Labs today.      Myalgia due to statin    History of trial on various statins with side effect of myalgia, continue Zetia and could consider Repatha in future if elevations above goal.       Other Visit Diagnoses    Routine general medical examination at a health care facility    -  Primary   Presents for annual exam: CBC, CMP, TSH, Lipid today   Relevant Orders   CBC with Differential/Platelet   TSH   Encounter for screening mammogram for malignant neoplasm of breast       Mammogram ordered, due  December.   Relevant Orders   MM DIGITAL SCREENING BILATERAL       Follow up plan: Return in about 6 months (around 11/03/2020) for HLD and OSTEOPENIA.   LABORATORY TESTING:  - Pap smear: not applicable  IMMUNIZATIONS:   - Tdap: Tetanus vaccination status reviewed: last tetanus booster within 10 years. - Influenza: Up to date - Pneumovax: Up to date - Prevnar: Up to date - HPV: Not applicable - Zostavax vaccine: Up to date  SCREENING: -Mammogram: Up to date  - Colonoscopy: Up to date  - Bone Density: Up to date -- 2024 recheck (osteopenia) -Hearing Test: Not applicable  -Spirometry: Not applicable   PATIENT COUNSELING:   Advised to take 1 mg of folate supplement per day if capable of pregnancy.   Sexuality: Discussed sexually transmitted diseases, partner selection, use of condoms, avoidance of unintended pregnancy  and contraceptive alternatives.   Advised to avoid cigarette smoking.  I discussed with the patient that most people either abstain from alcohol or drink within safe limits (<=14/week and <=4 drinks/occasion for males, <=7/weeks and <= 3 drinks/occasion for females) and that the risk for alcohol disorders and other health effects rises proportionally with the number of drinks per week and how often a drinker exceeds daily limits.  Discussed cessation/primary prevention of drug use and availability of treatment for abuse.   Diet: Encouraged to adjust caloric intake to maintain  or achieve ideal body weight, to reduce intake of dietary saturated fat and total fat, to limit sodium intake by avoiding high sodium foods and not adding table salt, and to maintain adequate dietary potassium and calcium preferably from fresh fruits, vegetables,  and low-fat dairy products.    stressed the importance of regular exercise  Injury prevention: Discussed safety belts, safety helmets, smoke detector, smoking near bedding or upholstery.   Dental health: Discussed importance of  regular tooth brushing, flossing, and dental visits.    NEXT PREVENTATIVE PHYSICAL DUE IN 1 YEAR. Return in about 6 months (around 11/03/2020) for HLD and OSTEOPENIA.

## 2020-05-05 NOTE — Assessment & Plan Note (Signed)
Chronic, ongoing with poor statin tolerance in past. Continue current medication regimen, Zetia.  Lipid and CMP panel today.

## 2020-05-06 LAB — CBC WITH DIFFERENTIAL/PLATELET
Basophils Absolute: 0.1 10*3/uL (ref 0.0–0.2)
Basos: 2 %
EOS (ABSOLUTE): 0.1 10*3/uL (ref 0.0–0.4)
Eos: 2 %
Hematocrit: 39.2 % (ref 34.0–46.6)
Hemoglobin: 12.8 g/dL (ref 11.1–15.9)
Immature Grans (Abs): 0 10*3/uL (ref 0.0–0.1)
Immature Granulocytes: 0 %
Lymphocytes Absolute: 1.7 10*3/uL (ref 0.7–3.1)
Lymphs: 36 %
MCH: 29.6 pg (ref 26.6–33.0)
MCHC: 32.7 g/dL (ref 31.5–35.7)
MCV: 91 fL (ref 79–97)
Monocytes Absolute: 0.4 10*3/uL (ref 0.1–0.9)
Monocytes: 8 %
Neutrophils Absolute: 2.4 10*3/uL (ref 1.4–7.0)
Neutrophils: 52 %
Platelets: 148 10*3/uL — ABNORMAL LOW (ref 150–450)
RBC: 4.33 x10E6/uL (ref 3.77–5.28)
RDW: 12.6 % (ref 11.7–15.4)
WBC: 4.7 10*3/uL (ref 3.4–10.8)

## 2020-05-06 LAB — COMPREHENSIVE METABOLIC PANEL
ALT: 16 IU/L (ref 0–32)
AST: 20 IU/L (ref 0–40)
Albumin/Globulin Ratio: 2.5 — ABNORMAL HIGH (ref 1.2–2.2)
Albumin: 4.3 g/dL (ref 3.8–4.8)
Alkaline Phosphatase: 60 IU/L (ref 44–121)
BUN/Creatinine Ratio: 19 (ref 12–28)
BUN: 13 mg/dL (ref 8–27)
Bilirubin Total: 1.1 mg/dL (ref 0.0–1.2)
CO2: 24 mmol/L (ref 20–29)
Calcium: 8.6 mg/dL — ABNORMAL LOW (ref 8.7–10.3)
Chloride: 102 mmol/L (ref 96–106)
Creatinine, Ser: 0.68 mg/dL (ref 0.57–1.00)
GFR calc Af Amer: 104 mL/min/{1.73_m2} (ref >59)
GFR calc non Af Amer: 90 mL/min/{1.73_m2} (ref >59)
Globulin, Total: 1.7 g/dL (ref 1.5–4.5)
Glucose: 86 mg/dL (ref 65–99)
Potassium: 3.7 mmol/L (ref 3.5–5.2)
Sodium: 140 mmol/L (ref 134–144)
Total Protein: 6 g/dL (ref 6.0–8.5)

## 2020-05-06 LAB — TSH: TSH: 3.15 u[IU]/mL (ref 0.450–4.500)

## 2020-05-06 LAB — VITAMIN D 25 HYDROXY (VIT D DEFICIENCY, FRACTURES): Vit D, 25-Hydroxy: 21.3 ng/mL — ABNORMAL LOW (ref 30.0–100.0)

## 2020-05-06 LAB — LIPID PANEL W/O CHOL/HDL RATIO
Cholesterol, Total: 210 mg/dL — ABNORMAL HIGH (ref 100–199)
HDL: 45 mg/dL (ref >39)
LDL Chol Calc (NIH): 144 mg/dL — ABNORMAL HIGH (ref 0–99)
Triglycerides: 117 mg/dL (ref 0–149)
VLDL Cholesterol Cal: 21 mg/dL (ref 5–40)

## 2020-05-06 NOTE — Progress Notes (Signed)
Contacted via MyChart The 10-year ASCVD risk score Mikey Bussing DC Jr., et al., 2013) is: 6.2%   Values used to calculate the score:     Age: 69 years     Sex: Female     Is Non-Hispanic African American: No     Diabetic: No     Tobacco smoker: No     Systolic Blood Pressure: 885 mmHg     Is BP treated: No     HDL Cholesterol: 45 mg/dL     Total Cholesterol: 210 mg/dL   Good morning Patricia Ferguson, your labs have returned: - Kidney, liver, and thyroid function are normal - CBC continues to show very mildly low platelet count, but this is trending upward from previous check 4 months ago.  We will recheck next visit.  Platelets help with blood clotting. - Vitamin D level remains on lower side.  With your osteopenia I recommend Vitamin D supplementation of about 2,0000 IUs of over the counter Vitamin D3. In addition, recommend a diet high in calcium with dairy and dark green leafy vegetables. Get plenty of weight bearing exercises with walking and resistance training such as light weights or resistance bands available with instructions at places such as [employer].  - Cholesterol levels (LDL, bad cholesterol, and total cholesterol) remain elevated.  However, your cardiac risk score is 6.2%, risk for stroke or heart event in next 10 years.  In future we may want to consider addition of low dose statin even on 3 day a week scheduled, like Rosuvastatin, but for now can continue home regimen.  Any questions? Keep being awesome!!  Thank you for allowing me to participate in your care. Kindest regards, Vonette Grosso

## 2020-05-10 ENCOUNTER — Other Ambulatory Visit: Payer: Self-pay | Admitting: Nurse Practitioner

## 2020-05-10 MED ORDER — ROSUVASTATIN CALCIUM 20 MG PO TABS: ORAL_TABLET | ORAL | 3 refills | Status: DC

## 2020-06-09 ENCOUNTER — Other Ambulatory Visit: Payer: Self-pay | Admitting: Nurse Practitioner

## 2020-06-09 DIAGNOSIS — Z1231 Encounter for screening mammogram for malignant neoplasm of breast: Secondary | ICD-10-CM

## 2020-06-30 ENCOUNTER — Encounter: Payer: Self-pay | Admitting: Nurse Practitioner

## 2020-06-30 ENCOUNTER — Ambulatory Visit (INDEPENDENT_AMBULATORY_CARE_PROVIDER_SITE_OTHER): Payer: Medicare PPO | Admitting: Nurse Practitioner

## 2020-06-30 ENCOUNTER — Other Ambulatory Visit: Payer: Self-pay

## 2020-06-30 VITALS — BP 109/71 | HR 59 | Temp 97.7°F | Ht 63.19 in | Wt 203.4 lb

## 2020-06-30 DIAGNOSIS — M85831 Other specified disorders of bone density and structure, right forearm: Secondary | ICD-10-CM

## 2020-06-30 DIAGNOSIS — E6609 Other obesity due to excess calories: Secondary | ICD-10-CM | POA: Diagnosis not present

## 2020-06-30 DIAGNOSIS — E782 Mixed hyperlipidemia: Secondary | ICD-10-CM | POA: Diagnosis not present

## 2020-06-30 DIAGNOSIS — T466X5A Adverse effect of antihyperlipidemic and antiarteriosclerotic drugs, initial encounter: Secondary | ICD-10-CM

## 2020-06-30 DIAGNOSIS — M791 Myalgia, unspecified site: Secondary | ICD-10-CM

## 2020-06-30 DIAGNOSIS — Z6835 Body mass index (BMI) 35.0-35.9, adult: Secondary | ICD-10-CM

## 2020-06-30 NOTE — Assessment & Plan Note (Signed)
Chronic, ongoing with poor statin tolerance in past. Continue current medication regimen, Zetia and Crestor as is tolerating this on 3 day a week regimen.  Could consider discontinuation of Zetia in future if improved levels.  Lipid panel today.

## 2020-06-30 NOTE — Patient Instructions (Signed)
Preventing High Cholesterol Cholesterol is a white, waxy substance similar to fat that the human body needs to help build cells. The liver makes all the cholesterol that a person's body needs. Having high cholesterol (hypercholesterolemia) increases a person's risk for heart disease and stroke. Extra (excess) cholesterol comes from the food the person eats. High cholesterol can often be prevented with diet and lifestyle changes. If you already have high cholesterol, you can control it with diet and lifestyle changes and with medicine. How can high cholesterol affect me? If you have high cholesterol, deposits (plaques) may build up on the walls of your arteries. The arteries are the blood vessels that carry blood away from your heart. Plaques make the arteries narrower and stiffer. This can limit or block blood flow and cause blood clots to form. Blood clots:  Are tiny balls of cells that form in your blood.  Can move to the heart or brain, causing a heart attack or stroke. Plaques in arteries greatly increase your risk for heart attack and stroke.Making diet and lifestyle changes can reduce your risk for these conditions that may threaten your life. What can increase my risk? This condition is more likely to develop in people who:  Eat foods that are high in saturated fat or cholesterol. Saturated fat is mostly found in: ? Foods that contain animal fat, such as red meat and some dairy products. ? Certain fatty foods made from plants, such as tropical oils.  Are overweight.  Are not getting enough exercise.  Have a family history of high cholesterol. What actions can I take to prevent this? Nutrition   Eat less saturated fat.  Avoid trans fats (partially hydrogenated oils). These are often found in margarine and in some baked goods, fried foods, and snacks bought in packages.  Avoid precooked or cured meat, such as sausages or meat loaves.  Avoid foods and drinks that have added  sugars.  Eat more fruits, vegetables, and whole grains.  Choose healthy sources of protein, such as fish, poultry, lean cuts of red meat, beans, peas, lentils, and nuts.  Choose healthy sources of fat, such as: ? Nuts. ? Vegetable oils, especially olive oil. ? Fish that have healthy fats (omega-3 fatty acids), such as mackerel or salmon. The items listed above may not be a complete list of recommended foods and beverages. Contact a dietitian for more information. Lifestyle  Lose weight if you are overweight. Losing 5-10 lb (2.3-4.5 kg) can help prevent or control high cholesterol. It can also lower your risk for diabetes and high blood pressure. Ask your health care provider to help you with a diet and exercise plan to lose weight safely.  Do not use any products that contain nicotine or tobacco, such as cigarettes, e-cigarettes, and chewing tobacco. If you need help quitting, ask your health care provider.  Limit your alcohol intake. ? Do not drink alcohol if:  Your health care provider tells you not to drink.  You are pregnant, may be pregnant, or are planning to become pregnant. ? If you drink alcohol:  Limit how much you use to:  0-1 drink a day for women.  0-2 drinks a day for men.  Be aware of how much alcohol is in your drink. In the [place] one drink equals one 12 oz bottle of beer (355 mL), one 5 oz glass of wine (148 mL), or one 1 oz glass of hard liquor (44 mL). Activity   Get enough exercise. Each week, do at   least 150 minutes of exercise that takes a medium level of effort (moderate-intensity exercise). ? This is exercise that:  Makes your heart beat faster and makes you breathe harder than usual.  Allows you to still be able to talk. ? You could exercise in short sessions several times a day or longer sessions a few times a week. For example, on 5 days each week, you could walk fast or ride your bike 3 times a day for 10 minutes each time.  Do exercises as told  by your health care provider. Medicines  In addition to diet and lifestyle changes, your health care provider may recommend medicines to help lower cholesterol. This may be a medicine to lower the amount of cholesterol your liver makes. You may need medicine if: ? Diet and lifestyle changes do not lower your cholesterol enough. ? You have high cholesterol and other risk factors for heart disease or stroke.  Take over-the-counter and prescription medicines only as told by your health care provider. General information  Manage your risk factors for high cholesterol. Talk with your health care provider about all your risk factors and how to lower your risk.  Manage other conditions that you have, such as diabetes or high blood pressure (hypertension).  Have blood tests to check your cholesterol levels at regular points in time as told by your health care provider.  Keep all follow-up visits as told by your health care provider. This is important. Where to find more information  American Heart Association: www.heart.org  National Heart, Lung, and Blood Institute: www.nhlbi.nih.gov Summary  High cholesterol increases your risk for heart disease and stroke. By keeping your cholesterol level low, you can reduce your risk for these conditions.  High cholesterol can often be prevented with diet and lifestyle changes.  Work with your health care provider to manage your risk factors, and have your blood tested regularly. This information is not intended to replace advice given to you by your health care provider. Make sure you discuss any questions you have with your health care provider. Document Revised: 11/01/2018 Document Reviewed: 03/18/2016 Elsevier Patient Education  2020 Elsevier Inc.  

## 2020-06-30 NOTE — Assessment & Plan Note (Signed)
BMI 35.82.  Recommended eating smaller high protein, low fat meals more frequently and exercising 30 mins a day 5 times a week with a goal of 10-15lb weight loss in the next 3 months. Patient voiced their understanding and motivation to adhere to these recommendations.

## 2020-06-30 NOTE — Assessment & Plan Note (Signed)
History of trial on various statins with side effect of myalgia, continue Zetia and tolerating Crestor at three days a week at this time.  Continue this and if any adverse effects will consider change to Repatha. 

## 2020-06-30 NOTE — Progress Notes (Signed)
BP 109/71   Pulse (!) 59   Temp 97.7 F (36.5 C) (Oral)   Ht 5' 3.19" (1.605 m)   Wt 203 lb 6.4 oz (92.3 kg)   LMP  (LMP Unknown)   SpO2 96%   BMI 35.82 kg/m    Subjective:    Patient ID: Patricia Ferguson, female    DOB: March 10, 1951, 69 y.o.   MRN: [id number]  HPI: Patricia Ferguson is a 69 y.o. female  Chief Complaint  Patient presents with  . Hyperlipidemia  . Osteopenia   HYPERLIPIDEMIA Continues on Zetia.  Started on Crestor 20 MG three days a week. Hyperlipidemia status: good compliance Satisfied with current treatment?  yes Side effects:  no Medication compliance: good compliance Past cholesterol meds: atorvastain (lipitor) and ezetimide (zetia) Supplements: none Aspirin:  no The 10-year ASCVD risk score Mikey Bussing DC Jr., et al., 2013) is: 6.1%   Values used to calculate the score:     Age: 31 years     Sex: Female     Is Non-Hispanic African American: No     Diabetic: No     Tobacco smoker: No     Systolic Blood Pressure: 678 mmHg     Is BP treated: No     HDL Cholesterol: 45 mg/dL     Total Cholesterol: 210 mg/dL Chest pain:  no Coronary artery disease:  no Family history CAD:  no Family history early CAD:  no   OSTEOPENIA Last DEXA showed osteopenia with T-score -2.3.  Taking Vitamin D3 and calcium.  No recent falls or fractures.  Continues on Evista. Satisfied with current treatment?: yes Adequate calcium & vitamin D: yes Weight bearing exercises: yes  Relevant past medical, surgical, family and social history reviewed and updated as indicated. Interim medical history since our last visit reviewed. Allergies and medications reviewed and updated.  Review of Systems  Constitutional: Negative for activity change, appetite change, diaphoresis, fatigue and fever.  Respiratory: Negative for cough, chest tightness and shortness of breath.   Cardiovascular: Negative for chest pain, palpitations and leg swelling.  Gastrointestinal: Negative.   Neurological:  Negative.   Psychiatric/Behavioral: Negative.     Per HPI unless specifically indicated above     Objective:    BP 109/71   Pulse (!) 59   Temp 97.7 F (36.5 C) (Oral)   Ht 5' 3.19" (1.605 m)   Wt 203 lb 6.4 oz (92.3 kg)   LMP  (LMP Unknown)   SpO2 96%   BMI 35.82 kg/m   Wt Readings from Last 3 Encounters:  06/30/20 203 lb 6.4 oz (92.3 kg)  05/05/20 210 lb (95.3 kg)  03/15/20 213 lb (96.6 kg)    Physical Exam Vitals and nursing note reviewed.  Constitutional:      General: She is awake. She is not in acute distress.    Appearance: She is well-developed. She is obese. She is not ill-appearing.  HENT:     Head: Normocephalic.     Right Ear: Hearing normal.     Left Ear: Hearing normal.  Eyes:     General: Lids are normal.        Right eye: No discharge.        Left eye: No discharge.     Conjunctiva/sclera: Conjunctivae normal.     Pupils: Pupils are equal, round, and reactive to light.  Neck:     Thyroid: No thyromegaly.     Vascular: No carotid bruit.  Cardiovascular:  Rate and Rhythm: Normal rate and regular rhythm.     Heart sounds: Normal heart sounds. No murmur heard.  No gallop.   Pulmonary:     Effort: Pulmonary effort is normal. No accessory muscle usage or respiratory distress.     Breath sounds: Normal breath sounds.  Abdominal:     General: Bowel sounds are normal.     Palpations: Abdomen is soft.  Musculoskeletal:     Cervical back: Normal range of motion and neck supple.     Right lower leg: No edema.     Left lower leg: No edema.  Skin:    General: Skin is warm and dry.  Neurological:     Mental Status: She is alert and oriented to person, place, and time.  Psychiatric:        Attention and Perception: Attention normal.        Mood and Affect: Mood normal.        Speech: Speech normal.        Behavior: Behavior normal. Behavior is cooperative.        Thought Content: Thought content normal.     Results for orders placed or performed  in visit on 05/05/20  CBC with Differential/Platelet  Result Value Ref Range   WBC 4.7 3.4 - 10.8 x10E3/uL   RBC 4.33 3.77 - 5.28 x10E6/uL   Hemoglobin 12.8 11.1 - 15.9 g/dL   Hematocrit 39.2 34.0 - 46.6 %   MCV 91 79 - 97 fL   MCH 29.6 26.6 - 33.0 pg   MCHC 32.7 31 - 35 g/dL   RDW 12.6 11.7 - 15.4 %   Platelets 148 (L) 150 - 450 x10E3/uL   Neutrophils 52 Not Estab. %   Lymphs 36 Not Estab. %   Monocytes 8 Not Estab. %   Eos 2 Not Estab. %   Basos 2 Not Estab. %   Neutrophils Absolute 2.4 1.40 - 7.00 x10E3/uL   Lymphocytes Absolute 1.7 0 - 3 x10E3/uL   Monocytes Absolute 0.4 0 - 0 x10E3/uL   EOS (ABSOLUTE) 0.1 0.0 - 0.4 x10E3/uL   Basophils Absolute 0.1 0 - 0 x10E3/uL   Immature Granulocytes 0 Not Estab. %   Immature Grans (Abs) 0.0 0.0 - 0.1 x10E3/uL  Comprehensive metabolic panel  Result Value Ref Range   Glucose 86 65 - 99 mg/dL   BUN 13 8 - 27 mg/dL   Creatinine, Ser 0.68 0.57 - 1.00 mg/dL   GFR calc non Af Amer 90 >59 mL/min/1.73   GFR calc Af Amer 104 >59 mL/min/1.73   BUN/Creatinine Ratio 19 12 - 28   Sodium 140 134 - 144 mmol/L   Potassium 3.7 3.5 - 5.2 mmol/L   Chloride 102 96 - 106 mmol/L   CO2 24 20 - 29 mmol/L   Calcium 8.6 (L) 8.7 - 10.3 mg/dL   Total Protein 6.0 6.0 - 8.5 g/dL   Albumin 4.3 3.8 - 4.8 g/dL   Globulin, Total 1.7 1.5 - 4.5 g/dL   Albumin/Globulin Ratio 2.5 (H) 1.2 - 2.2   Bilirubin Total 1.1 0.0 - 1.2 mg/dL   Alkaline Phosphatase 60 44 - 121 IU/L   AST 20 0 - 40 IU/L   ALT 16 0 - 32 IU/L  Lipid Panel w/o Chol/HDL Ratio  Result Value Ref Range   Cholesterol, Total 210 (H) 100 - 199 mg/dL   Triglycerides 117 0 - 149 mg/dL   HDL 45 >39 mg/dL   VLDL Cholesterol Cal 21  5 - 40 mg/dL   LDL Chol Calc (NIH) 144 (H) 0 - 99 mg/dL  TSH  Result Value Ref Range   TSH 3.150 0.450 - 4.500 uIU/mL  VITAMIN D 25 Hydroxy (Vit-D Deficiency, Fractures)  Result Value Ref Range   Vit D, 25-Hydroxy 21.3 (L) 30.0 - 100.0 ng/mL      Assessment & Plan:    Problem List Items Addressed This Visit      Musculoskeletal and Integument   Osteopenia    Continue Evista, but consider holiday of Evista in new year as recent DEXA in 2019 and 2014 noted osteopenia -- she is aware she can take break from taking this in the new year.  Repeat DEXA in 2024, if needed will return to Evista dosing.        Other   Hyperlipidemia - Primary    Chronic, ongoing with poor statin tolerance in past. Continue current medication regimen, Zetia and Crestor as is tolerating this on 3 day a week regimen.  Could consider discontinuation of Zetia in future if improved levels.  Lipid panel today.      Relevant Orders   Lipid Panel w/o Chol/HDL Ratio   Obesity    BMI 35.82.  Recommended eating smaller high protein, low fat meals more frequently and exercising 30 mins a day 5 times a week with a goal of 10-15lb weight loss in the next 3 months. Patient voiced their understanding and motivation to adhere to these recommendations.       Myalgia due to statin    History of trial on various statins with side effect of myalgia, continue Zetia and tolerating Crestor at three days a week at this time.  Continue this and if any adverse effects will consider change to Repatha.          Follow up plan: Return in about 6 months (around 12/29/2020) for HLD and OSTEOPENIA.

## 2020-06-30 NOTE — Assessment & Plan Note (Signed)
Continue Evista, but consider holiday of Evista in new year as recent DEXA in 2019 and 2014 noted osteopenia -- she is aware she can take break from taking this in the new year.  Repeat DEXA in 2024, if needed will return to Evista dosing.

## 2020-07-01 LAB — LIPID PANEL W/O CHOL/HDL RATIO
Cholesterol, Total: 147 mg/dL (ref 100–199)
HDL: 46 mg/dL (ref >39)
LDL Chol Calc (NIH): 81 mg/dL (ref 0–99)
Triglycerides: 113 mg/dL (ref 0–149)
VLDL Cholesterol Cal: 20 mg/dL (ref 5–40)

## 2020-07-01 NOTE — Progress Notes (Signed)
Contacted via MyChart   Good evening Zakiyah, I have good news!!  Your cholesterol levels are much improved with taking medication only 3 days a week, if you are tolerating this I recommend continuing this regimen.  Your LDL went from 141 to now almost at stroke prevention goal which is <70, your current level is 81.  Your total cholesterol went from 210 to 147.  Much better levels.  Any questions? Keep being awesome!!  Thank you for allowing me to participate in your care. Kindest regards, Nalina Yeatman

## 2020-08-05 ENCOUNTER — Other Ambulatory Visit: Payer: Self-pay

## 2020-08-05 ENCOUNTER — Ambulatory Visit
Admission: RE | Admit: 2020-08-05 | Discharge: 2020-08-05 | Disposition: A | Discharge status: Home / Self Care | Payer: Medicare PPO | Source: Ambulatory Visit | Attending: Nurse Practitioner | Admitting: Nurse Practitioner

## 2020-08-05 DIAGNOSIS — Z1231 Encounter for screening mammogram for malignant neoplasm of breast: Secondary | ICD-10-CM | POA: Insufficient documentation

## 2020-08-06 NOTE — Progress Notes (Signed)
Contacted via MyChart   Normal mammogram resulted, can repeat in one year.  Stay warm and safe this weekend.:)

## 2020-10-27 ENCOUNTER — Other Ambulatory Visit: Payer: Self-pay | Admitting: Nurse Practitioner

## 2020-10-27 NOTE — Telephone Encounter (Signed)
Requested Prescriptions  Pending Prescriptions Disp Refills  . ezetimibe (ZETIA) 10 MG tablet [Pharmacy Med Name: EZETIMIBE 10 MG TAB] 90 tablet 3    Sig: TAKE 1 TABLET BY MOUTH ONCE DAILY     Cardiovascular:  Antilipid - Sterol Transport Inhibitors Failed - 10/27/2020  9:05 AM      Failed - LDL in normal range and within 360 days    LDL Chol Calc (NIH)  Date Value Ref Range Status  06/30/2020 81 0 - 99 mg/dL Final         Passed - Total Cholesterol in normal range and within 360 days    Cholesterol, Total  Date Value Ref Range Status  06/30/2020 147 100 - 199 mg/dL Final         Passed - HDL in normal range and within 360 days    HDL  Date Value Ref Range Status  06/30/2020 46 >39 mg/dL Final         Passed - Triglycerides in normal range and within 360 days    Triglycerides  Date Value Ref Range Status  06/30/2020 113 0 - 149 mg/dL Final         Passed - Valid encounter within last 12 months    Recent Outpatient Visits          3 months ago Mixed hyperlipidemia   [place], Henrine Screws T, NP   5 months ago Routine general medical examination at a health care facility   [facility], [place] T, NP   11 months ago Mixed hyperlipidemia   [place], [place] T, NP   1 year ago Mixed hyperlipidemia   [place], [place] T, NP   1 year ago Encounter for annual physical exam   [place], Barbaraann Faster, NP      Future Appointments            In 2 months Cannady, Barbaraann Faster, NP [facility]   In 4 months  [facility]           . famotidine (PEPCID) 20 MG tablet [Pharmacy Med Name: FAMOTIDINE 20 MG TAB] 90 tablet 3    Sig: TAKE 1 TABLET BY MOUTH ONCE DAILY     Gastroenterology:  H2 Antagonists Passed - 10/27/2020  9:05 AM      Passed - Valid encounter within last 12 months    Recent Outpatient Visits          3 months ago Mixed  hyperlipidemia   [facility] Cannady, Henrine Screws T, NP   5 months ago Routine general medical examination at a health care facility   [facility], [place] T, NP   11 months ago Mixed hyperlipidemia   [place], [place] T, NP   1 year ago Mixed hyperlipidemia   [place], [place] T, NP   1 year ago Encounter for annual physical exam   [place], Barbaraann Faster, NP      Future Appointments            In 2 months Cannady, Barbaraann Faster, NP [facility]   In 4 months  [facility]

## 2020-11-03 ENCOUNTER — Ambulatory Visit: Payer: Medicare PPO | Admitting: Nurse Practitioner

## 2020-12-24 ENCOUNTER — Encounter: Payer: Self-pay | Admitting: Nurse Practitioner

## 2020-12-24 DIAGNOSIS — D696 Thrombocytopenia, unspecified: Secondary | ICD-10-CM | POA: Insufficient documentation

## 2020-12-29 ENCOUNTER — Encounter: Payer: Self-pay | Admitting: Nurse Practitioner

## 2020-12-29 ENCOUNTER — Ambulatory Visit: Payer: Medicare PPO | Admitting: Nurse Practitioner

## 2020-12-29 ENCOUNTER — Other Ambulatory Visit: Payer: Self-pay

## 2020-12-29 VITALS — BP 101/68 | HR 58 | Temp 98.8°F | Ht 63.8 in | Wt 199.6 lb

## 2020-12-29 DIAGNOSIS — E559 Vitamin D deficiency, unspecified: Secondary | ICD-10-CM

## 2020-12-29 DIAGNOSIS — Z6834 Body mass index (BMI) 34.0-34.9, adult: Secondary | ICD-10-CM

## 2020-12-29 DIAGNOSIS — D696 Thrombocytopenia, unspecified: Secondary | ICD-10-CM

## 2020-12-29 DIAGNOSIS — E782 Mixed hyperlipidemia: Secondary | ICD-10-CM | POA: Diagnosis not present

## 2020-12-29 DIAGNOSIS — M791 Myalgia, unspecified site: Secondary | ICD-10-CM

## 2020-12-29 DIAGNOSIS — M85831 Other specified disorders of bone density and structure, right forearm: Secondary | ICD-10-CM | POA: Diagnosis not present

## 2020-12-29 DIAGNOSIS — T466X5A Adverse effect of antihyperlipidemic and antiarteriosclerotic drugs, initial encounter: Secondary | ICD-10-CM

## 2020-12-29 DIAGNOSIS — E6609 Other obesity due to excess calories: Secondary | ICD-10-CM

## 2020-12-29 NOTE — Assessment & Plan Note (Signed)
Sporadic noted on labs over past 5 years on occasion -- 147 and 148 range.  Discussed with patient, recheck today.  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

## 2020-12-29 NOTE — Assessment & Plan Note (Signed)
Chronic, ongoing with poor statin tolerance in past. Continue current medication regimen, Zetia and Crestor as is tolerating this on 3 day a week regimen.  Could consider discontinuation of Zetia in future if improved levels.  Lipid panel today.

## 2020-12-29 NOTE — Assessment & Plan Note (Signed)
History of trial on various statins with side effect of myalgia, continue Zetia and tolerating Crestor at three days a week at this time.  Continue this and if any adverse effects will consider change to Repatha. 

## 2020-12-29 NOTE — Assessment & Plan Note (Signed)
Chronic, ongoing, recheck level today and continue current supplement. May need to increase supplement dosing if remains <30. 

## 2020-12-29 NOTE — Patient Instructions (Signed)
Osteopenia  Osteopenia is a loss of thickness (density) inside the bones. Another name for osteopenia is low bone mass. Mild osteopenia is a normal part of aging. It is not a disease, and it does not cause symptoms. However, if you have osteopenia and continue to lose bone mass, you could develop a condition that causes the bones to become thin and break more easily (osteoporosis). Osteoporosis can cause you to lose some height, have back pain, and have a stooped posture. Although osteopenia is not a disease, making changes to your lifestyle and diet can help to prevent osteopenia from developing into osteoporosis. What are the causes? Osteopenia is caused by loss of calcium in the bones. Bones are constantly changing. Old bone cells are continually being replaced with new bone cells. This process builds new bone. The mineral calcium is needed to build new bone and maintain bone density. Bone density is usually highest around age 35. After that, most people's bodies cannot replace all the bone they have lost with new bone. What increases the risk? You are more likely to develop this condition if:  You are older than age 50.  You are a woman who went through menopause early.  You have a long illness that keeps you in bed.  You do not get enough exercise.  You lack certain nutrients (malnutrition).  You have an overactive thyroid gland (hyperthyroidism).  You use products that contain nicotine or tobacco, such as cigarettes, e-cigarettes and chewing tobacco, or you drink a lot of alcohol.  You are taking medicines that weaken the bones, such as steroids. What are the signs or symptoms? This condition does not cause any symptoms. You may have a slightly higher risk for bone breaks (fractures), so getting fractures more easily than normal may be an indication of osteopenia. How is this diagnosed? This condition may be diagnosed based on an X-ray exam that measures bone density (dual-energy  X-ray absorptiometry, or DEXA). This test can measure bone density in your hips, spine, and wrists. Osteopenia has no symptoms, so this condition is usually diagnosed after a routine bone density screening test is done for osteoporosis. This routine screening is usually done for:  Women who are age 65 or older.  Men who are age 70 or older. If you have risk factors for osteopenia, you may have the screening test at an earlier age. How is this treated? Making dietary and lifestyle changes can lower your risk for osteoporosis. If you have severe osteopenia that is close to becoming osteoporosis, this condition can be treated with medicines and dietary supplements such as calcium and vitamin D. These supplements help to rebuild bone density. Follow these instructions at home: Eating and drinking Eat a diet that is high in calcium and vitamin D.  Calcium is found in dairy products, beans, salmon, and leafy green vegetables like spinach and broccoli.  Look for foods that have vitamin D and calcium added to them (fortified foods), such as orange juice, cereal, and bread.   Lifestyle  Do 30 minutes or more of a weight-bearing exercise every day, such as walking, jogging, or playing a sport. These types of exercises strengthen the bones.  Do not use any products that contain nicotine or tobacco, such as cigarettes, e-cigarettes, and chewing tobacco. If you need help quitting, ask your health care provider.  Do not drink alcohol if: ? Your health care provider tells you not to drink. ? You are pregnant, may be pregnant, or are planning to become   pregnant.  If you drink alcohol: ? Limit how much you use to:  0-1 drink a day for women.  0-2 drinks a day for men. ? Be aware of how much alcohol is in your drink. In the [place] one drink equals one 12 oz bottle of beer (355 mL), one 5 oz glass of wine (148 mL), or one 1 oz glass of hard liquor (44 mL). General instructions  Take over-the-counter  and prescription medicines only as told by your health care provider. These include vitamins and supplements.  Take precautions at home to lower your risk of falling, such as: ? Keeping rooms well-lit and free of clutter, such as cords. ? Installing safety rails on stairs. ? Using rubber mats in the bathroom or other areas that are often wet or slippery.  Keep all follow-up visits. This is important. Contact a health care provider if:  You have not had a bone density screening for osteoporosis and you are: ? A woman who is age 65 or older. ? A man who is age 70 or older.  You are a postmenopausal woman who has not had a bone density screening for osteoporosis.  You are older than age 50 and you want to know if you should have bone density screening for osteoporosis. Summary  Osteopenia is a loss of thickness (density) inside the bones. Another name for osteopenia is low bone mass.  Osteopenia is not a disease, but it may increase your risk for a condition that causes the bones to become thin and break more easily (osteoporosis).  You may be at risk for osteopenia if you are older than age 50 or if you are a woman who went through early menopause.  Osteopenia does not cause any symptoms, but it can be diagnosed with a bone density screening test.  Dietary and lifestyle changes are the first treatment for osteopenia. These may lower your risk for osteoporosis. This information is not intended to replace advice given to you by your health care provider. Make sure you discuss any questions you have with your health care provider. Document Revised: 12/25/2019 Document Reviewed: 12/25/2019 Elsevier Patient Education  2021 Elsevier Inc.  

## 2020-12-29 NOTE — Assessment & Plan Note (Signed)
On holiday from Evista, started January 2022.  DEXA in 2019 and 2014 noted osteopenia.  Repeat DEXA in 2024, if needed will return to Evista dosing.  Vitamin D level today.

## 2020-12-29 NOTE — Assessment & Plan Note (Signed)
BMI 34.48, praised for weight loss.  Recommended eating smaller high protein, low fat meals more frequently and exercising 30 mins a day 5 times a week with a goal of 10-15lb weight loss in the next 3 months. Patient voiced their understanding and motivation to adhere to these recommendations.

## 2020-12-29 NOTE — Progress Notes (Signed)
BP 101/68   Pulse (!) 58   Temp 98.8 F (37.1 C) (Oral)   Ht 5' 3.8" (1.621 m)   Wt 199 lb 9.6 oz (90.5 kg)   LMP  (LMP Unknown)   SpO2 98%   BMI 34.48 kg/m    Subjective:    Patient ID: Patricia Ferguson, female    DOB: 1950-10-18, 70 y.o.   MRN: [id number]  HPI: Patricia Ferguson is a 70 y.o. female  Chief Complaint  Patient presents with  . Hyperlipidemia  . osteopenia   HYPERLIPIDEMIA Continues on Zetia and Rosuvastatin 3 days a week.  Was noted to have low platelets recent labs, noted 5 years ago too, platelet 147 to 148.  She is donating plasma, once a week. Hyperlipidemia status: good compliance Satisfied with current treatment?  yes Side effects:  no Medication compliance: good compliance Past cholesterol meds: atorvastain (lipitor) and ezetimide (zetia) Supplements: none Aspirin:  no The 10-year ASCVD risk score Mikey Bussing DC Jr., et al., 2013) is: 4.6%   Values used to calculate the score:     Age: 42 years     Sex: Female     Is Non-Hispanic African American: No     Diabetic: No     Tobacco smoker: No     Systolic Blood Pressure: 950 mmHg     Is BP treated: No     HDL Cholesterol: 46 mg/dL     Total Cholesterol: 147 mg/dL Chest pain:  no Coronary artery disease:  no Family history CAD:  no Family history early CAD:  no   OSTEOPENIA Last DEXA showed osteopenia with T-score -2.3.  Taking Vitamin D3 and calcium.  No recent falls or fractures.  Taking holiday from [place]. Satisfied with current treatment?: yes Adequate calcium & vitamin D: yes Weight bearing exercises: yes  Relevant past medical, surgical, family and social history reviewed and updated as indicated. Interim medical history since our last visit reviewed. Allergies and medications reviewed and updated.  Review of Systems  Constitutional: Negative for activity change, appetite change, diaphoresis, fatigue and fever.  Respiratory: Negative for cough, chest tightness and shortness of breath.    Cardiovascular: Negative for chest pain, palpitations and leg swelling.  Gastrointestinal: Negative.   Neurological: Negative.   Psychiatric/Behavioral: Negative.     Per HPI unless specifically indicated above     Objective:    BP 101/68   Pulse (!) 58   Temp 98.8 F (37.1 C) (Oral)   Ht 5' 3.8" (1.621 m)   Wt 199 lb 9.6 oz (90.5 kg)   LMP  (LMP Unknown)   SpO2 98%   BMI 34.48 kg/m   Wt Readings from Last 3 Encounters:  12/29/20 199 lb 9.6 oz (90.5 kg)  06/30/20 203 lb 6.4 oz (92.3 kg)  05/05/20 210 lb (95.3 kg)    Physical Exam Vitals and nursing note reviewed.  Constitutional:      General: She is awake. She is not in acute distress.    Appearance: She is well-developed. She is obese. She is not ill-appearing.  HENT:     Head: Normocephalic.     Right Ear: Hearing normal.     Left Ear: Hearing normal.  Eyes:     General: Lids are normal.        Right eye: No discharge.        Left eye: No discharge.     Conjunctiva/sclera: Conjunctivae normal.     Pupils: Pupils are equal, round, and  reactive to light.  Neck:     Thyroid: No thyromegaly.     Vascular: No carotid bruit.  Cardiovascular:     Rate and Rhythm: Normal rate and regular rhythm.     Heart sounds: Normal heart sounds. No murmur heard. No gallop.   Pulmonary:     Effort: Pulmonary effort is normal. No accessory muscle usage or respiratory distress.     Breath sounds: Normal breath sounds.  Abdominal:     General: Bowel sounds are normal.     Palpations: Abdomen is soft.  Musculoskeletal:     Cervical back: Normal range of motion and neck supple.     Right lower leg: No edema.     Left lower leg: No edema.  Skin:    General: Skin is warm and dry.  Neurological:     Mental Status: She is alert and oriented to person, place, and time.  Psychiatric:        Attention and Perception: Attention normal.        Mood and Affect: Mood normal.        Speech: Speech normal.        Behavior: Behavior  normal. Behavior is cooperative.        Thought Content: Thought content normal.     Results for orders placed or performed in visit on 06/30/20  Lipid Panel w/o Chol/HDL Ratio  Result Value Ref Range   Cholesterol, Total 147 100 - 199 mg/dL   Triglycerides 113 0 - 149 mg/dL   HDL 46 >39 mg/dL   VLDL Cholesterol Cal 20 5 - 40 mg/dL   LDL Chol Calc (NIH) 81 0 - 99 mg/dL      Assessment & Plan:   Problem List Items Addressed This Visit      Musculoskeletal and Integument   Osteopenia    On holiday from [place], started January 2022.  DEXA in 2019 and 2014 noted osteopenia.  Repeat DEXA in 2024, if needed will return to [place] dosing.  Vitamin D level today.      Relevant Orders   VITAMIN D 25 Hydroxy (Vit-D Deficiency, Fractures)     Other   Hyperlipidemia    Chronic, ongoing with poor statin tolerance in past. Continue current medication regimen, Zetia and Crestor as is tolerating this on 3 day a week regimen.  Could consider discontinuation of Zetia in future if improved levels.  Lipid panel today.      Relevant Orders   Comprehensive metabolic panel   Lipid Panel w/o Chol/HDL Ratio   Obesity    BMI 34.48, praised for weight loss.  Recommended eating smaller high protein, low fat meals more frequently and exercising 30 mins a day 5 times a week with a goal of 10-15lb weight loss in the next 3 months. Patient voiced their understanding and motivation to adhere to these recommendations.       Myalgia due to statin    History of trial on various statins with side effect of myalgia, continue Zetia and tolerating Crestor at three days a week at this time.  Continue this and if any adverse effects will consider change to Repatha.      Thrombocytopenia ([place]) - Primary    Sporadic noted on labs over past 5 years on occasion -- 147 and 148 range.  Discussed with patient, recheck today.  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      Relevant Orders   CBC with  Differential/Platelet  Vitamin D deficiency    Chronic, ongoing, recheck level today and continue current supplement. May need to increase supplement dosing if remains <30.          Follow up plan: Return in about 6 months (around 06/30/2021) for OSTEOPENIA, HLD, PLT CHECK.

## 2020-12-30 LAB — LIPID PANEL W/O CHOL/HDL RATIO
Cholesterol, Total: 168 mg/dL (ref 100–199)
HDL: 54 mg/dL (ref >39)
LDL Chol Calc (NIH): 96 mg/dL (ref 0–99)
Triglycerides: 100 mg/dL (ref 0–149)
VLDL Cholesterol Cal: 18 mg/dL (ref 5–40)

## 2020-12-30 LAB — COMPREHENSIVE METABOLIC PANEL
ALT: 22 IU/L (ref 0–32)
AST: 24 IU/L (ref 0–40)
Albumin/Globulin Ratio: 2.5 — ABNORMAL HIGH (ref 1.2–2.2)
Albumin: 4.5 g/dL (ref 3.8–4.8)
Alkaline Phosphatase: 63 IU/L (ref 44–121)
BUN/Creatinine Ratio: 13 (ref 12–28)
BUN: 9 mg/dL (ref 8–27)
Bilirubin Total: 1.1 mg/dL (ref 0.0–1.2)
CO2: 23 mmol/L (ref 20–29)
Calcium: 9.1 mg/dL (ref 8.7–10.3)
Chloride: 105 mmol/L (ref 96–106)
Creatinine, Ser: 0.69 mg/dL (ref 0.57–1.00)
Globulin, Total: 1.8 g/dL (ref 1.5–4.5)
Glucose: 75 mg/dL (ref 65–99)
Potassium: 4.2 mmol/L (ref 3.5–5.2)
Sodium: 143 mmol/L (ref 134–144)
Total Protein: 6.3 g/dL (ref 6.0–8.5)
eGFR: 94 mL/min/{1.73_m2} (ref >59)

## 2020-12-30 LAB — CBC WITH DIFFERENTIAL/PLATELET
Basophils Absolute: 0.1 10*3/uL (ref 0.0–0.2)
Basos: 1 %
EOS (ABSOLUTE): 0.1 10*3/uL (ref 0.0–0.4)
Eos: 2 %
Hematocrit: 37 % (ref 34.0–46.6)
Hemoglobin: 12.4 g/dL (ref 11.1–15.9)
Immature Grans (Abs): 0 10*3/uL (ref 0.0–0.1)
Immature Granulocytes: 0 %
Lymphocytes Absolute: 1.7 10*3/uL (ref 0.7–3.1)
Lymphs: 33 %
MCH: 29.9 pg (ref 26.6–33.0)
MCHC: 33.5 g/dL (ref 31.5–35.7)
MCV: 89 fL (ref 79–97)
Monocytes Absolute: 0.4 10*3/uL (ref 0.1–0.9)
Monocytes: 7 %
Neutrophils Absolute: 3 10*3/uL (ref 1.4–7.0)
Neutrophils: 57 %
Platelets: 164 10*3/uL (ref 150–450)
RBC: 4.15 x10E6/uL (ref 3.77–5.28)
RDW: 12.7 % (ref 11.7–15.4)
WBC: 5.2 10*3/uL (ref 3.4–10.8)

## 2020-12-30 LAB — VITAMIN D 25 HYDROXY (VIT D DEFICIENCY, FRACTURES): Vit D, 25-Hydroxy: 26.8 ng/mL — ABNORMAL LOW (ref 30.0–100.0)

## 2020-12-30 NOTE — Progress Notes (Signed)
Contacted via MyChart   Good afternoon Tiffanni, your labs have returned and remain stable with exception of Vitamin D still being a bit on lower side.  Are you taking Vitamin D3 2000 units daily? If not then I recommend you start this dosing.  Let me know.  Have a great day!! Keep being awesome!!  Thank you for allowing me to participate in your care.  I appreciate you. Kindest regards, Jalin Erpelding

## 2021-03-16 ENCOUNTER — Ambulatory Visit: Payer: Medicare PPO

## 2021-03-18 ENCOUNTER — Ambulatory Visit (INDEPENDENT_AMBULATORY_CARE_PROVIDER_SITE_OTHER): Payer: Medicare PPO

## 2021-03-18 VITALS — Ht 64.0 in | Wt 198.0 lb

## 2021-03-18 DIAGNOSIS — Z0000 Encounter for general adult medical examination without abnormal findings: Secondary | ICD-10-CM | POA: Diagnosis not present

## 2021-03-18 NOTE — Progress Notes (Signed)
I connected with Patricia Ferguson today by telephone and verified that I am speaking with the correct person using two identifiers. Location patient: home Location provider: work Persons participating in the virtual visit: Kess Treasea, [employer] LPN.   I discussed the limitations, risks, security and privacy concerns of performing an evaluation and management service by telephone and the availability of in person appointments. I also discussed with the patient that there may be a patient responsible charge related to this service. The patient expressed understanding and verbally consented to this telephonic visit.    Interactive audio and video telecommunications were attempted between this provider and patient, however failed, due to patient having technical difficulties OR patient did not have access to video capability.  We continued and completed visit with audio only.     Vital signs may be patient reported or missing.  Subjective:   Patricia Ferguson is a 70 y.o. female who presents for Medicare Annual (Subsequent) preventive examination.  Review of Systems     Cardiac Risk Factors include: advanced age (>69mn, >>69women);dyslipidemia;obesity (BMI >30kg/m2);sedentary lifestyle     Objective:    Today's Vitals   03/18/21 1111  Weight: 198 lb (89.8 kg)  Height: '5\' 4"'$  (1.626 m)   Body mass index is 33.99 kg/m.  Advanced Directives 03/18/2021 03/15/2020 01/07/2020 12/31/2019 03/05/2019 02/25/2018 02/23/2017  Does Patient Have a Medical Advance Directive? No No No No No No No  Would patient like information on creating a medical advance directive? - - No - Patient declined Yes (MAU/Ambulatory/Procedural Areas - Information given) - Yes (MAU/Ambulatory/Procedural Areas - Information given) No - Patient declined    Current Medications (verified) Outpatient Encounter Medications as of 03/18/2021  Medication Sig   ezetimibe (ZETIA) 10 MG tablet TAKE 1 TABLET BY MOUTH ONCE DAILY    famotidine (PEPCID) 20 MG tablet TAKE 1 TABLET BY MOUTH ONCE DAILY   hydroxypropyl methylcellulose / hypromellose (ISOPTO TEARS / GONIOVISC) 2.5 % ophthalmic solution Place 1 drop into both eyes daily.   Magnesium 250 MG TABS Take 1 tablet by mouth daily.   rosuvastatin (CRESTOR) 20 MG tablet Take 20 MG by mouth three days a week, on Monday, Wednesday, and Friday.   Vitamin D, Cholecalciferol, 25 MCG (1000 UT) TABS Take 1 tablet by mouth daily.   No facility-administered encounter medications on file as of 03/18/2021.    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 SCREWS IN BACK   bone spur     BREAST BIOPSY Right 1980's   BREAST SURGERY     CERVICAL CONE BIOPSY     lumbar fission     MASS EXCISION Left 01/07/2020   Procedure: EXCISION MASS (RESECTION OF ANTECUBITAL VENOUS ANEURYSM);  Surgeon: SKatha Cabal MD;  Location: [facility] ORS;  Service: Vascular;  Laterality: Left;   Family History  Problem Relation Age of Onset  Diabetes Mother    Hyperlipidemia Mother    Hypertension Mother    Osteoporosis Mother    Cancer Father        lung   Diabetes Father    Diabetes Sister    Hyperlipidemia Sister    Hypertension Sister    Hypertension Brother    Hyperlipidemia Sister    Breast cancer Neg Hx    Social History   Socioeconomic History   Marital status: Married    Spouse name: Jeneen Rinks   Number of children:  Not on file   Years of education: some college    Highest education level: High school graduate  Occupational History   Occupation: Lobbyist part time     Comment: Administrator  Tobacco Use   Smoking status: Never   Smokeless tobacco: Never  Vaping Use   Vaping Use: Never used  Substance and Sexual Activity   Alcohol use: Not Currently    Alcohol/week: 0.0 standard drinks    Comment: on occasion   Drug use: No   Sexual activity: Not Currently  Other Topics Concern   Not on file  Social History Narrative   Working part time .    Lives with husband.   Social Determinants of Health   Financial Resource Strain: Low Risk    Difficulty of Paying Living Expenses: Not hard at all  Food Insecurity: No Food Insecurity   Worried About Charity fundraiser in the Last Year: Never true   [place] in the Last Year: Never true  Transportation Needs: No Transportation Needs   Lack of Transportation (Medical): No   Lack of Transportation (Non-Medical): No  Physical Activity: Inactive   Days of Exercise per Week: 0 days   Minutes of Exercise per Session: 0 min  Stress: Stress Concern Present   Feeling of Stress : To some extent  Social Connections: Not on file    Tobacco Counseling Counseling given: Not Answered   Clinical Intake:  Pre-visit preparation completed: Yes  Pain : No/denies pain     Nutritional Status: BMI > 30  Obese Nutritional Risks: None Diabetes: No  How often do you need to have someone help you when you read instructions, pamphlets, or other written materials from your doctor or pharmacy?: 1 - Never What is the last grade level you completed in school?: 12th grade  Diabetic? no  Interpreter Needed?: No  Information entered by :: NAllen LPN   Activities of Daily Living In your present state of health, do you have any difficulty performing the following activities: 03/18/2021  Hearing? N  Vision? N  Difficulty concentrating or  making decisions? N  Walking or climbing stairs? Y  Dressing or bathing? N  Doing errands, shopping? N  Preparing Food and eating ? N  Using the Toilet? N  In the past six months, have you accidently leaked urine? Y  Do you have problems with loss of bowel control? N  Managing your Medications? N  Managing your Finances? N  Housekeeping or managing your Housekeeping? N  Some recent data might be hidden    Patient Care Team: Venita Lick, NP as PCP - General (Nurse Practitioner)  Indicate any recent Medical Services you may have received from other than [facility] providers in the past year (date may be approximate).     Assessment:   This is a routine wellness examination for Patricia Ferguson.  Hearing/Vision screen Vision Screening - Comments:: Regular eye exams, [facility]  Dietary issues and exercise activities discussed: Current Exercise Habits: The patient does not participate in regular exercise at present   Goals Addressed             This Visit's Progress    Patient Stated       03/18/2021, wants to weigh 170 pounds       Depression Screen PHQ 2/9 Scores 03/18/2021 03/15/2020 03/05/2019 02/25/2018 08/28/2017 02/23/2017 02/21/2016  PHQ - 2 Score 0 0 0 0 0 0 0  PHQ- 9 Score - - - - - 0 -    Fall Risk Fall Risk  03/18/2021 06/30/2020 03/15/2020 03/05/2019 02/25/2018  Falls in the past year? 0 0 0 0 No  Number falls in past yr: - 0 - - -  Injury with Fall? - 0 - - -  Risk for fall due to : No Fall Risks - Medication side effect - -  Follow up Falls evaluation completed;Education provided;Falls prevention discussed Falls evaluation completed Falls evaluation completed;Education provided;Falls prevention discussed - -    FALL RISK PREVENTION PERTAINING TO THE HOME:  Any stairs in or around the home? Yes  If so, are there any without handrails? No  Home free of loose throw rugs in walkways, pet beds, electrical cords, etc? Yes  Adequate lighting in your home to reduce risk  of falls? Yes   ASSISTIVE DEVICES UTILIZED TO PREVENT FALLS:  Life alert? No  Use of a cane, walker or w/c? No  Grab bars in the bathroom? Yes  Shower chair or bench in shower? No  Elevated toilet seat or a handicapped toilet? No   TIMED UP AND GO:  Was the test performed? No .      Cognitive Function:     6CIT Screen 03/18/2021 03/15/2020 02/25/2018  What Year? 0 points 0 points 0 points  What month? 0 points 0 points 0 points  What time? 0 points 0 points 0 points  Count back from 20 0 points 0 points 0 points  Months in reverse 0 points 0 points 0 points  Repeat phrase 2 points 0 points 0 points  Total Score 2 0 0    Immunizations Immunization History  Administered Date(s) Administered   Influenza, High Dose Seasonal PF 05/02/2019, 04/15/2020   Influenza-Unspecified 04/21/2016, 04/16/2017, 04/07/2018   PFIZER(Purple Top)SARS-COV-2 Vaccination 09/08/2019, 09/29/2019, 04/24/2020, 10/25/2020   Pneumococcal Conjugate-13 08/28/2017   Pneumococcal Polysaccharide-23 03/05/2019   Td 07/24/2004, 09/17/2009   Tdap 04/14/2020   Zoster Recombinat (Shingrix) 05/02/2019   Zoster, Live 10/25/2012    TDAP status: Up to date  Flu Vaccine status: Due, Education has been provided regarding the importance of this vaccine. Advised may receive this vaccine at local pharmacy or Health Dept. Aware to provide a copy of the vaccination record if obtained from local pharmacy or Health Dept. Verbalized acceptance and understanding.  Pneumococcal vaccine status: Up to date  Covid-19 vaccine status: Completed vaccines  Qualifies for Shingles Vaccine? Yes   Zostavax completed Yes   Shingrix Completed?: Yes  Screening Tests Health Maintenance  Topic Date Due   Zoster Vaccines- Shingrix (2 of 2) 06/27/2019   INFLUENZA VACCINE  02/21/2021   COLONOSCOPY (Pts 45-79yr Insurance coverage will need to be confirmed)  04/11/2022   MAMMOGRAM  08/05/2022   TETANUS/TDAP  04/14/2030   DEXA SCAN   Completed   COVID-19 Vaccine  Completed   Hepatitis C Screening  Completed   PNA vac Low Risk Adult  Completed   HPV VACCINES  Aged  Out    Health Maintenance  Health Maintenance Due  Topic Date Due   Zoster Vaccines- Shingrix (2 of 2) 06/27/2019   INFLUENZA VACCINE  02/21/2021    Colorectal cancer screening: Type of screening: Colonoscopy. Completed 04/11/2012. Repeat every 10 years  Mammogram status: Completed 08/05/2020. Repeat every year  Bone Density status: Completed 07/11/2018.  Lung Cancer Screening: (Low Dose CT Chest recommended if Age 64-80 years, 30 pack-year currently smoking OR have quit w/in 15years.) does not qualify.   Lung Cancer Screening Referral: no  Additional Screening:  Hepatitis C Screening: does qualify; Completed 02/21/2016  Vision Screening: Recommended annual ophthalmology exams for early detection of glaucoma and other disorders of the eye. Is the patient up to date with their annual eye exam?  Yes  Who is the provider or what is the name of the office in which the patient attends annual eye exams? [facility] If pt is not established with a provider, would they like to be referred to a provider to establish care? No .   Dental Screening: Recommended annual dental exams for proper oral hygiene  Community Resource Referral / Chronic Care Management: CRR required this visit?  No   CCM required this visit?  No      Plan:     I have personally reviewed and noted the following in the patient's chart:   Medical and social history Use of alcohol, tobacco or illicit drugs  Current medications and supplements including opioid prescriptions.  Functional ability and status Nutritional status Physical activity Advanced directives List of other physicians Hospitalizations, surgeries, and ER visits in previous 12 months Vitals Screenings to include cognitive, depression, and falls Referrals and appointments  In addition, I have reviewed  and discussed with patient certain preventive protocols, quality metrics, and best practice recommendations. A written personalized care plan for preventive services as well as general preventive health recommendations were provided to patient.     Kellie Simmering, LPN   [id number]   Nurse Notes:

## 2021-03-18 NOTE — Patient Instructions (Signed)
Patricia Ferguson , Thank you for taking time to come for your Medicare Wellness Visit. I appreciate your ongoing commitment to your health goals. Please review the following plan we discussed and let me know if I can assist you in the future.   Screening recommendations/referrals: Colonoscopy: completed 04/11/2012 Mammogram: completed 08/05/2020 Bone Density: completed 07/11/2018 Recommended yearly ophthalmology/optometry visit for glaucoma screening and checkup Recommended yearly dental visit for hygiene and checkup  Vaccinations: Influenza vaccine: due Pneumococcal vaccine: completed 03/05/2019 Tdap vaccine: completed 04/14/2020, due 04/14/2030 Shingles vaccine: completed   Covid-19: 10/25/2020, 04/24/2020, 09/29/2019, 09/08/2019  Advanced directives: Advance directive discussed with you today.   Conditions/risks identified: none  Next appointment: Follow up in one year for your annual wellness visit    Preventive Care 65 Years and Older, Female Preventive care refers to lifestyle choices and visits with your health care provider that can promote health and wellness. What does preventive care include? A yearly physical exam. This is also called an annual well check. Dental exams once or twice a year. Routine eye exams. Ask your health care provider how often you should have your eyes checked. Personal lifestyle choices, including: Daily care of your teeth and gums. Regular physical activity. Eating a healthy diet. Avoiding tobacco and drug use. Limiting alcohol use. Practicing safe sex. Taking low-dose aspirin every day. Taking vitamin and mineral supplements as recommended by your health care provider. What happens during an annual well check? The services and screenings done by your health care provider during your annual well check will depend on your age, overall health, lifestyle risk factors, and family history of disease. Counseling  Your health care provider may ask you questions  about your: Alcohol use. Tobacco use. Drug use. Emotional well-being. Home and relationship well-being. Sexual activity. Eating habits. History of falls. Memory and ability to understand (cognition). Work and work Statistician. Reproductive health. Screening  You may have the following tests or measurements: Height, weight, and BMI. Blood pressure. Lipid and cholesterol levels. These may be checked every 5 years, or more frequently if you are over 68 years old. Skin check. Lung cancer screening. You may have this screening every year starting at age 31 if you have a 30-pack-year history of smoking and currently smoke or have quit within the past 15 years. Fecal occult blood test (FOBT) of the stool. You may have this test every year starting at age 61. Flexible sigmoidoscopy or colonoscopy. You may have a sigmoidoscopy every 5 years or a colonoscopy every 10 years starting at age 66. Hepatitis C blood test. Hepatitis B blood test. Sexually transmitted disease (STD) testing. Diabetes screening. This is done by checking your blood sugar (glucose) after you have not eaten for a while (fasting). You may have this done every 1-3 years. Bone density scan. This is done to screen for osteoporosis. You may have this done starting at age 57. Mammogram. This may be done every 1-2 years. Talk to your health care provider about how often you should have regular mammograms. Talk with your health care provider about your test results, treatment options, and if necessary, the need for more tests. Vaccines  Your health care provider may recommend certain vaccines, such as: Influenza vaccine. This is recommended every year. Tetanus, diphtheria, and acellular pertussis (Tdap, Td) vaccine. You may need a Td booster every 10 years. Zoster vaccine. You may need this after age 23. Pneumococcal 13-valent conjugate (PCV13) vaccine. One dose is recommended after age 19. Pneumococcal polysaccharide (PPSV23)  vaccine. One  dose is recommended after age 38. Talk to your health care provider about which screenings and vaccines you need and how often you need them. This information is not intended to replace advice given to you by your health care provider. Make sure you discuss any questions you have with your health care provider. Document Released: 08/06/2015 Document Revised: 03/29/2016 Document Reviewed: 05/11/2015 Elsevier Interactive Patient Education  2017 [employer] Prevention in the Home Falls can cause injuries. They can happen to people of all ages. There are many things you can do to make your home safe and to help prevent falls. What can I do on the outside of my home? Regularly fix the edges of walkways and driveways and fix any cracks. Remove anything that might make you trip as you walk through a door, such as a raised step or threshold. Trim any bushes or trees on the path to your home. Use bright outdoor lighting. Clear any walking paths of anything that might make someone trip, such as rocks or tools. Regularly check to see if handrails are loose or broken. Make sure that both sides of any steps have handrails. Any raised decks and porches should have guardrails on the edges. Have any leaves, snow, or ice cleared regularly. Use sand or salt on walking paths during winter. Clean up any spills in your garage right away. This includes oil or grease spills. What can I do in the bathroom? Use night lights. Install grab bars by the toilet and in the tub and shower. Do not use towel bars as grab bars. Use non-skid mats or decals in the tub or shower. If you need to sit down in the shower, use a plastic, non-slip stool. Keep the floor dry. Clean up any water that spills on the floor as soon as it happens. Remove soap buildup in the tub or shower regularly. Attach bath mats securely with double-sided non-slip rug tape. Do not have throw rugs and other things on the floor that  can make you trip. What can I do in the bedroom? Use night lights. Make sure that you have a light by your bed that is easy to reach. Do not use any sheets or blankets that are too big for your bed. They should not hang down onto the floor. Have a firm chair that has side arms. You can use this for support while you get dressed. Do not have throw rugs and other things on the floor that can make you trip. What can I do in the kitchen? Clean up any spills right away. Avoid walking on wet floors. Keep items that you use a lot in easy-to-reach places. If you need to reach something above you, use a strong step stool that has a grab bar. Keep electrical cords out of the way. Do not use floor polish or wax that makes floors slippery. If you must use wax, use non-skid floor wax. Do not have throw rugs and other things on the floor that can make you trip. What can I do with my stairs? Do not leave any items on the stairs. Make sure that there are handrails on both sides of the stairs and use them. Fix handrails that are broken or loose. Make sure that handrails are as long as the stairways. Check any carpeting to make sure that it is firmly attached to the stairs. Fix any carpet that is loose or worn. Avoid having throw rugs at the top or bottom of the stairs.  If you do have throw rugs, attach them to the floor with carpet tape. Make sure that you have a light switch at the top of the stairs and the bottom of the stairs. If you do not have them, ask someone to add them for you. What else can I do to help prevent falls? Wear shoes that: Do not have high heels. Have rubber bottoms. Are comfortable and fit you well. Are closed at the toe. Do not wear sandals. If you use a stepladder: Make sure that it is fully opened. Do not climb a closed stepladder. Make sure that both sides of the stepladder are locked into place. Ask someone to hold it for you, if possible. Clearly mark and make sure that you  can see: Any grab bars or handrails. First and last steps. Where the edge of each step is. Use tools that help you move around (mobility aids) if they are needed. These include: Canes. Walkers. Scooters. Crutches. Turn on the lights when you go into a dark area. Replace any light bulbs as soon as they burn out. Set up your furniture so you have a clear path. Avoid moving your furniture around. If any of your floors are uneven, fix them. If there are any pets around you, be aware of where they are. Review your medicines with your doctor. Some medicines can make you feel dizzy. This can increase your chance of falling. Ask your doctor what other things that you can do to help prevent falls. This information is not intended to replace advice given to you by your health care provider. Make sure you discuss any questions you have with your health care provider. Document Released: 05/06/2009 Document Revised: 12/16/2015 Document Reviewed: 08/14/2014 Elsevier Interactive Patient Education  2017 [employer].

## 2021-06-20 ENCOUNTER — Other Ambulatory Visit: Payer: Self-pay | Admitting: Nurse Practitioner

## 2021-06-21 NOTE — Telephone Encounter (Signed)
Requested Prescriptions  Pending Prescriptions Disp Refills  . rosuvastatin (CRESTOR) 20 MG tablet [Pharmacy Med Name: ROSUVASTATIN CALCIUM 20 MG TAB] 90 tablet 0    Sig: TAKE 1 TABLET BY MOUTH THREE TIMES A WEEK (MON WEDNESDAY AND FRIDAY)     Cardiovascular:  Antilipid - Statins Passed - 06/20/2021  8:16 AM      Passed - Total Cholesterol in normal range and within 360 days    Cholesterol, Total  Date Value Ref Range Status  12/29/2020 168 100 - 199 mg/dL Final         Passed - LDL in normal range and within 360 days    LDL Chol Calc (NIH)  Date Value Ref Range Status  12/29/2020 96 0 - 99 mg/dL Final         Passed - HDL in normal range and within 360 days    HDL  Date Value Ref Range Status  12/29/2020 54 >39 mg/dL Final         Passed - Triglycerides in normal range and within 360 days    Triglycerides  Date Value Ref Range Status  12/29/2020 100 0 - 149 mg/dL Final         Passed - Patient is not pregnant      Passed - Valid encounter within last 12 months    Recent Outpatient Visits          5 months ago Thrombocytopenia ([place])   [place], [place] T, NP   11 months ago Mixed hyperlipidemia   [place], [place] T, NP   1 year ago Routine general medical examination at a health care facility   [place], [place] T, NP   1 year ago Mixed hyperlipidemia   [place], Barbaraann Faster, NP   1 year ago Mixed hyperlipidemia   [place], Barbaraann Faster, NP      Future Appointments            In 1 week [place], Barbaraann Faster, NP [facility]   In 9 months  [facility]

## 2021-06-30 ENCOUNTER — Other Ambulatory Visit: Payer: Self-pay

## 2021-06-30 ENCOUNTER — Encounter: Payer: Self-pay | Admitting: Nurse Practitioner

## 2021-06-30 ENCOUNTER — Ambulatory Visit: Payer: Medicare PPO | Admitting: Nurse Practitioner

## 2021-06-30 VITALS — BP 104/69 | HR 66 | Temp 98.1°F | Wt 197.6 lb

## 2021-06-30 DIAGNOSIS — D696 Thrombocytopenia, unspecified: Secondary | ICD-10-CM | POA: Diagnosis not present

## 2021-06-30 DIAGNOSIS — E782 Mixed hyperlipidemia: Secondary | ICD-10-CM

## 2021-06-30 DIAGNOSIS — M85831 Other specified disorders of bone density and structure, right forearm: Secondary | ICD-10-CM | POA: Diagnosis not present

## 2021-06-30 DIAGNOSIS — E559 Vitamin D deficiency, unspecified: Secondary | ICD-10-CM

## 2021-06-30 DIAGNOSIS — T466X5A Adverse effect of antihyperlipidemic and antiarteriosclerotic drugs, initial encounter: Secondary | ICD-10-CM

## 2021-06-30 DIAGNOSIS — G2581 Restless legs syndrome: Secondary | ICD-10-CM | POA: Insufficient documentation

## 2021-06-30 DIAGNOSIS — Z6834 Body mass index (BMI) 34.0-34.9, adult: Secondary | ICD-10-CM

## 2021-06-30 DIAGNOSIS — F4323 Adjustment disorder with mixed anxiety and depressed mood: Secondary | ICD-10-CM | POA: Insufficient documentation

## 2021-06-30 DIAGNOSIS — R12 Heartburn: Secondary | ICD-10-CM

## 2021-06-30 DIAGNOSIS — R252 Cramp and spasm: Secondary | ICD-10-CM

## 2021-06-30 DIAGNOSIS — M791 Myalgia, unspecified site: Secondary | ICD-10-CM

## 2021-06-30 DIAGNOSIS — E6609 Other obesity due to excess calories: Secondary | ICD-10-CM

## 2021-06-30 MED ORDER — ROPINIROLE HCL 0.25 MG PO TABS: 0.2500 mg | ORAL_TABLET | Freq: Every day | ORAL | 4 refills | Status: DC

## 2021-06-30 NOTE — Assessment & Plan Note (Signed)
BMI 33.92, praised for weight loss.  Recommended eating smaller high protein, low fat meals more frequently and exercising 30 mins a day 5 times a week with a goal of 10-15lb weight loss in the next 3 months. Patient voiced their understanding and motivation to adhere to these recommendations.

## 2021-06-30 NOTE — Assessment & Plan Note (Signed)
More noted at night for years, worsening with increased stressors recently.  Will obtain imaging, low suspicion for DVT but will image for patient reassurance.  Mag level today and TSH.  Start low doe Requip at [facility] to see if benefit, educated her on this medication and use.  Return in 4 weeks.

## 2021-06-30 NOTE — Assessment & Plan Note (Addendum)
On holiday from [place], started January 2022.  DEXA in 2019 and 2014 noted osteopenia.  Repeat DEXA in 2024, if needed will return to [place] dosing.  Vitamin D level and PTH today.

## 2021-06-30 NOTE — Assessment & Plan Note (Signed)
Ongoing and improved with Pepcid.  Will continue current medication regimen and adjust as needed.  Obtain CMP and Mag level today.

## 2021-06-30 NOTE — Patient Instructions (Signed)
Managing Anxiety, Adult ?After being diagnosed with anxiety, you may be relieved to know why you have felt or behaved a certain way. You may also feel overwhelmed about the treatment ahead and what it will mean for your life. With care and support, you can manage this condition. ?How to manage lifestyle changes ?Managing stress and anxiety ?Stress is your body's reaction to life changes and events, both good and bad. Most stress will last just a few hours, but stress can be ongoing and can lead to more than just stress. Although stress can play a major role in anxiety, it is not the same as anxiety. Stress is usually caused by something external, such as a deadline, test, or competition. Stress normally passes after the triggering event has ended.  ?Anxiety is caused by something internal, such as imagining a terrible outcome or worrying that something will go wrong that will devastate you. Anxiety often does not go away even after the triggering event is over, and it can become long-term (chronic) worry. It is important to understand the differences between stress and anxiety and to manage your stress effectively so that it does not lead to an anxious response. ?Talk with your health care provider or a counselor to learn more about reducing anxiety and stress. He or she may suggest tension reduction techniques, such as: ?Music therapy. Spend time creating or listening to music that you enjoy and that inspires you. ?Mindfulness-based meditation. Practice being aware of your normal breaths while not trying to control your breathing. It can be done while sitting or walking. ?Centering prayer. This involves focusing on a word, phrase, or sacred image that means something to you and brings you peace. ?Deep breathing. To do this, expand your stomach and inhale slowly through your nose. Hold your breath for 3-5 seconds. Then exhale slowly, letting your stomach muscles relax. ?Self-talk. Learn to notice and identify  thought patterns that lead to anxiety reactions and change those patterns to thoughts that feel peaceful. ?Muscle relaxation. Taking time to tense muscles and then relax them. ?Choose a tension reduction technique that fits your lifestyle and personality. These techniques take time and practice. Set aside 5-15 minutes a day to do them. Therapists can offer counseling and training in these techniques. The training to help with anxiety may be covered by some insurance plans. ?Other things you can do to manage stress and anxiety include: ?Keeping a stress diary. This can help you learn what triggers your reaction and then learn ways to manage your response. ?Thinking about how you react to certain situations. You may not be able to control everything, but you can control your response. ?Making time for activities that help you relax and not feeling guilty about spending your time in this way. ?Doing visual imagery. This involves imagining or creating mental pictures to help you relax. ?Practicing yoga. Through yoga poses, you can lower tension and promote relaxation. ? ?Medicines ?Medicines can help ease symptoms. Medicines for anxiety include: ?Antidepressant medicines. These are usually prescribed for long-term daily control. ?Anti-anxiety medicines. These may be added in severe cases, especially when panic attacks occur. ?Medicines will be prescribed by a health care provider. When used together, medicines, psychotherapy, and tension reduction techniques may be the most effective treatment. ?Relationships ?Relationships can play a big part in helping you recover. Try to spend more time connecting with trusted friends and family members. ?Consider going to couples counseling if you have a partner, taking family education classes, or going to family   therapy. ?Therapy can help you and others better understand your condition. ?How to recognize changes in your anxiety ?Everyone responds differently to treatment for  anxiety. Recovery from anxiety happens when symptoms decrease and stop interfering with your daily activities at home or work. This may mean that you will start to: ?Have better concentration and focus. Worry will interfere less in your daily thinking. ?Sleep better. ?Be less irritable. ?Have more energy. ?Have improved memory. ?It is also important to recognize when your condition is getting worse. Contact your health care provider if your symptoms interfere with home or work and you feel like your condition is not improving. ?Follow these instructions at home: ?Activity ?Exercise. Adults should do the following: ?Exercise for at least 150 minutes each week. The exercise should increase your heart rate and make you sweat (moderate-intensity exercise). ?Strengthening exercises at least twice a week. ?Get the right amount and quality of sleep. Most adults need 7-9 hours of sleep each night. ?Lifestyle ? ?Eat a healthy diet that includes plenty of vegetables, fruits, whole grains, low-fat dairy products, and lean protein. ?Do not eat a lot of foods that are high in fats, added sugars, or salt (sodium). ?Make choices that simplify your life. ?Do not use any products that contain nicotine or tobacco. These products include cigarettes, chewing tobacco, and vaping devices, such as e-cigarettes. If you need help quitting, ask your health care provider. ?Avoid caffeine, alcohol, and certain over-the-counter cold medicines. These may make you feel worse. Ask your pharmacist which medicines to avoid. ?General instructions ?Take over-the-counter and prescription medicines only as told by your health care provider. ?Keep all follow-up visits. This is important. ?Where to find support ?You can get help and support from these sources: ?Self-help groups. ?Online and community organizations. ?A trusted spiritual leader. ?Couples counseling. ?Family education classes. ?Family therapy. ?Where to find more information ?You may find  that joining a support group helps you deal with your anxiety. The following sources can help you locate counselors or support groups near you: ?Mental Health America: www.mentalhealthamerica.net ?Anxiety and Depression Association of America (ADAA): www.adaa.org ?National Alliance on Mental Illness (NAMI): www.nami.org ?Contact a health care provider if: ?You have a hard time staying focused or finishing daily tasks. ?You spend many hours a day feeling worried about everyday life. ?You become exhausted by worry. ?You start to have headaches or frequently feel tense. ?You develop chronic nausea or diarrhea. ?Get help right away if: ?You have a racing heart and shortness of breath. ?You have thoughts of hurting yourself or others. ?If you ever feel like you may hurt yourself or others, or have thoughts about taking your own life, get help right away. Go to your nearest emergency department or: ?Call your local emergency services (911 in the [place] ?Call a suicide crisis helpline, such as the National Suicide Prevention Lifeline at 1-800-273-8255 or 988 in the [place] This is open 24 hours a day in the [place] ?Text the Crisis Text Line at 741741 (in the [place] ?Summary ?Taking steps to learn and use tension reduction techniques can help calm you and help prevent triggering an anxiety reaction. ?When used together, medicines, psychotherapy, and tension reduction techniques may be the most effective treatment. ?Family, friends, and partners can play a big part in supporting you. ?This information is not intended to replace advice given to you by your health care provider. Make sure you discuss any questions you have with your health care provider. ?Document Revised: 02/02/2021 Document Reviewed: 10/31/2020 ?Elsevier Patient   Education ? 2022 Elsevier Inc. ? ?

## 2021-06-30 NOTE — Assessment & Plan Note (Signed)
Chronic, ongoing with poor statin tolerance in past. Continue current medication regimen, Zetia and Crestor as is tolerating this on 3 day a week regimen.  Could consider discontinuation of Zetia in future if improved levels.  Lipid panel and CMP today. 

## 2021-06-30 NOTE — Assessment & Plan Note (Signed)
Sporadic noted on labs over past 5 years on occasion -- 147 and 148 range -- recent labs stable.  Discussed with patient, recheck today.  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

## 2021-06-30 NOTE — Assessment & Plan Note (Signed)
Over past weeks due to increased stressors at home.  At this time does not wish to trial medication.  She denies SI/HI.  Has therapy she can attain through [facility] and is looking into this, highly recommend she start therapy.  Return in 4 weeks.

## 2021-06-30 NOTE — Progress Notes (Signed)
BP 104/69   Pulse 66   Temp 98.1 F (36.7 C)   Wt 197 lb 9.6 oz (89.6 kg)   LMP  (LMP Unknown)   SpO2 99%   BMI 33.92 kg/m    Subjective:    Patient ID: Patricia Ferguson, female    DOB: [birth year], 70 y.o.   MRN: [id number]  HPI: Patricia Ferguson is a 70 y.o. female  Chief Complaint  Patient presents with   Osteopenia   Hyperlipidemia   PLT Check   Follow-up    Patient states she is having some issues at home that she would like to discuss with provider.    DEPRESSION/ANXIETY Increased stressors at home -- husband's health doing poorly and he is drinking more.  Plus her son had gotten divorced and is now back with the women he was married to, which has added stress as woman is not a nice person/using him.  Patient works at [employer]. Mood status: uncontrolled Symptom severity: moderate  Psychotherapy/counseling: none Previous psychiatric medications: none Depressed mood: yes Anxious mood: yes Anhedonia: no Significant weight loss or gain: no Insomnia: yes hard to fall asleep  Fatigue: yes Feelings of worthlessness or guilt:  sometimes Impaired concentration/indecisiveness: no Suicidal ideations: no -- no significant thoughts, sometimes thinks "what is the purpose" Hopelessness: yes Crying spells: yes Depression screen [facility] 2/9 06/30/2021 03/18/2021 03/15/2020 03/05/2019 02/25/2018  Decreased Interest 2 0 0 0 0  Down, Depressed, Hopeless 2 0 0 0 0  PHQ - 2 Score 4 0 0 0 0  Altered sleeping 3 - - - -  Tired, decreased energy 1 - - - -  Change in appetite 1 - - - -  Feeling bad or failure about yourself  2 - - - -  Trouble concentrating 1 - - - -  Moving slowly or fidgety/restless 0 - - - -  Suicidal thoughts 1 - - - -  PHQ-9 Score 13 - - - -  Difficult doing work/chores Somewhat difficult - - - -    GAD 7 : Generalized Anxiety Score 06/30/2021 03/05/2019  Nervous, Anxious, on Edge 2 0  Control/stop worrying 2 0  Worry too much - different things 2 0  Trouble  relaxing 2 0  Restless 0 0  Easily annoyed or irritable 1 0  Afraid - awful might happen 0 0  Total GAD 7 Score 9 0  Anxiety Difficulty Somewhat difficult -    HYPERLIPIDEMIA Continues on Zetia daily and Rosuvastatin 3 days a week.    Noted to have low platelets on past labs, noted 5 years ago too, platelet 147 to 148.  She is donating plasma, once a week.  Recent level was stable at 164. Hyperlipidemia status: good compliance Satisfied with current treatment?  yes Side effects:  no Medication compliance: good compliance Past cholesterol meds: atorvastain (lipitor) and ezetimide (zetia) Supplements: none Aspirin:  no The 10-year ASCVD risk score (Arnett DK, et al., 2019) is: 5.5%   Values used to calculate the score:     Age: 74 years     Sex: Female     Is Non-Hispanic African American: No     Diabetic: No     Tobacco smoker: No     Systolic Blood Pressure: 494 mmHg     Is BP treated: No     HDL Cholesterol: 54 mg/dL     Total Cholesterol: 168 mg/dL Chest pain:  no Coronary artery disease:  no Family history CAD:  no Family history early CAD:  no   GERD Taking Pepcid for heart burn.   GERD control status: stable Satisfied with current treatment? yes Heartburn frequency: none Medication side effects: no  Medication compliance: stable Dysphagia: no Odynophagia:  no Hematemesis: no Blood in stool: no EGD: no   LEG CRAMPS Occasional to left lower leg and left knee cap feels out of place at times, wears brace to prevent from worsening.  Notices cramps more during night.  Present for years, but lately gotten worse. Duration: chronic Pain: yes Severity: 9/10  Quality:  dull, aching, and cramping Location:  calves Bilateral:  no Onset: gradual -- lasts about 15 minutes Frequency:  at least one night a week Time of  day:   night time Sudden unintentional leg jerking:   no Paresthesias:   no Decreased sensation:  no Weakness:   yes Insomnia:   yes Fatigue:    no Alleviating factors: stretching Aggravating factors: unknown Status: worse Treatments attempted: Magnesium + leg cramp medication  OSTEOPENIA Last DEXA showed osteopenia with T-score -2.3.  Taking Vitamin D3 and calcium.  No recent falls or fractures.  Taking holiday from [place] at this time. Satisfied with current treatment?: yes Adequate calcium & vitamin D: yes Weight bearing exercises: yes  Relevant past medical, surgical, family and social history reviewed and updated as indicated. Interim medical history since our last visit reviewed. Allergies and medications reviewed and updated.  Review of Systems  Constitutional:  Negative for activity change, appetite change, diaphoresis, fatigue and fever.  Respiratory:  Negative for cough, chest tightness and shortness of breath.   Cardiovascular:  Negative for chest pain, palpitations and leg swelling.  Gastrointestinal: Negative.   Neurological: Negative.   Psychiatric/Behavioral: Negative.     Per HPI unless specifically indicated above     Objective:    BP 104/69   Pulse 66   Temp 98.1 F (36.7 C)   Wt 197 lb 9.6 oz (89.6 kg)   LMP  (LMP Unknown)   SpO2 99%   BMI 33.92 kg/m   Wt Readings from Last 3 Encounters:  06/30/21 197 lb 9.6 oz (89.6 kg)  03/18/21 198 lb (89.8 kg)  12/29/20 199 lb 9.6 oz (90.5 kg)    Physical Exam Vitals and nursing note reviewed.  Constitutional:      General: She is awake. She is not in acute distress.    Appearance: She is well-developed. She is obese. She is not ill-appearing.  HENT:     Head: Normocephalic.     Right Ear: Hearing normal.     Left Ear: Hearing normal.  Eyes:     General: Lids are normal.        Right eye: No discharge.        Left eye: No discharge.     Conjunctiva/sclera: Conjunctivae normal.     Pupils: Pupils are equal, round, and reactive to light.  Neck:     Thyroid: No thyromegaly.     Vascular: No carotid bruit.  Cardiovascular:     Rate and Rhythm:  Normal rate and regular rhythm.     Heart sounds: Normal heart sounds. No murmur heard.   No gallop.  Pulmonary:     Effort: Pulmonary effort is normal. No accessory muscle usage or respiratory distress.     Breath sounds: Normal breath sounds.  Abdominal:     General: Bowel sounds are normal.     Palpations: Abdomen is soft.  Musculoskeletal:  Cervical back: Normal range of motion and neck supple.     Right lower leg: No edema.     Left lower leg: No edema.  Skin:    General: Skin is warm and dry.  Neurological:     Mental Status: She is alert and oriented to person, place, and time.  Psychiatric:        Attention and Perception: Attention normal.        Mood and Affect: Mood normal.        Speech: Speech normal.        Behavior: Behavior normal. Behavior is cooperative.        Thought Content: Thought content normal.    Results for orders placed or performed in visit on 12/29/20  CBC with Differential/Platelet  Result Value Ref Range   WBC 5.2 3.4 - 10.8 x10E3/uL   RBC 4.15 3.77 - 5.28 x10E6/uL   Hemoglobin 12.4 11.1 - 15.9 g/dL   Hematocrit 37.0 34.0 - 46.6 %   MCV 89 79 - 97 fL   MCH 29.9 26.6 - 33.0 pg   MCHC 33.5 31.5 - 35.7 g/dL   RDW 12.7 11.7 - 15.4 %   Platelets 164 150 - 450 x10E3/uL   Neutrophils 57 Not Estab. %   Lymphs 33 Not Estab. %   Monocytes 7 Not Estab. %   Eos 2 Not Estab. %   Basos 1 Not Estab. %   Neutrophils Absolute 3.0 1.4 - 7.0 x10E3/uL   Lymphocytes Absolute 1.7 0.7 - 3.1 x10E3/uL   Monocytes Absolute 0.4 0.1 - 0.9 x10E3/uL   EOS (ABSOLUTE) 0.1 0.0 - 0.4 x10E3/uL   Basophils Absolute 0.1 0.0 - 0.2 x10E3/uL   Immature Granulocytes 0 Not Estab. %   Immature Grans (Abs) 0.0 0.0 - 0.1 x10E3/uL  Comprehensive metabolic panel  Result Value Ref Range   Glucose 75 65 - 99 mg/dL   BUN 9 8 - 27 mg/dL   Creatinine, Ser 0.69 0.57 - 1.00 mg/dL   eGFR 94 >59 mL/min/1.73   BUN/Creatinine Ratio 13 12 - 28   Sodium 143 134 - 144 mmol/L    Potassium 4.2 3.5 - 5.2 mmol/L   Chloride 105 96 - 106 mmol/L   CO2 23 20 - 29 mmol/L   Calcium 9.1 8.7 - 10.3 mg/dL   Total Protein 6.3 6.0 - 8.5 g/dL   Albumin 4.5 3.8 - 4.8 g/dL   Globulin, Total 1.8 1.5 - 4.5 g/dL   Albumin/Globulin Ratio 2.5 (H) 1.2 - 2.2   Bilirubin Total 1.1 0.0 - 1.2 mg/dL   Alkaline Phosphatase 63 44 - 121 IU/L   AST 24 0 - 40 IU/L   ALT 22 0 - 32 IU/L  Lipid Panel w/o Chol/HDL Ratio  Result Value Ref Range   Cholesterol, Total 168 100 - 199 mg/dL   Triglycerides 100 0 - 149 mg/dL   HDL 54 >39 mg/dL   VLDL Cholesterol Cal 18 5 - 40 mg/dL   LDL Chol Calc (NIH) 96 0 - 99 mg/dL  VITAMIN D 25 Hydroxy (Vit-D Deficiency, Fractures)  Result Value Ref Range   Vit D, 25-Hydroxy 26.8 (L) 30.0 - 100.0 ng/mL      Assessment & Plan:   Problem List Items Addressed This Visit       Musculoskeletal and Integument   Osteopenia    On holiday from [place], started January 2022.  DEXA in 2019 and 2014 noted osteopenia.  Repeat DEXA in 2024, if needed will  return to [place] dosing.  Vitamin D level and PTH today.      Relevant Orders   VITAMIN D 25 Hydroxy (Vit-D Deficiency, Fractures)   PTH, intact and calcium     Hematopoietic and Hemostatic   Thrombocytopenia ([place]) - Primary    Sporadic noted on labs over past 5 years on occasion -- 147 and 148 range -- recent labs stable.  Discussed with patient, recheck today.  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      Relevant Orders   CBC with Differential/Platelet     Other   Heart burn    Ongoing and improved with Pepcid.  Will continue current medication regimen and adjust as needed.  Obtain CMP and Mag level today.      Relevant Orders   Magnesium   Hyperlipidemia    Chronic, ongoing with poor statin tolerance in past. Continue current medication regimen, Zetia and Crestor as is tolerating this on 3 day a week regimen.  Could consider discontinuation of Zetia in future if improved levels.  Lipid panel  and CMP today.      Relevant Orders   Comprehensive metabolic panel   Lipid Panel w/o Chol/HDL Ratio   Leg cramps    More noted at night for years, worsening with increased stressors recently.  Will obtain imaging, low suspicion for DVT but will image for patient reassurance.  Mag level today and TSH.  Start low doe Requip at [facility] to see if benefit, educated her on this medication and use.  Return in 4 weeks.      Relevant Orders   TSH   US Venous Img Lower Unilateral Left (DVT)   Myalgia due to statin    History of trial on various statins with side effect of myalgia, continue Zetia and tolerating Crestor at three days a week at this time.  Continue this and if any adverse effects will consider change to Repatha.      Obesity    BMI 33.92, praised for weight loss.  Recommended eating smaller high protein, low fat meals more frequently and exercising 30 mins a day 5 times a week with a goal of 10-15lb weight loss in the next 3 months. Patient voiced their understanding and motivation to adhere to these recommendations.       Situational mixed anxiety and depressive disorder    Over past weeks due to increased stressors at home.  At this time does not wish to trial medication.  She denies SI/HI.  Has therapy she can attain through [facility] and is looking into this, highly recommend she start therapy.  Return in 4 weeks.      Vitamin D deficiency    Chronic, ongoing, recheck level today and continue current supplement. May need to increase supplement dosing if remains <30.      Relevant Orders   VITAMIN D 25 Hydroxy (Vit-D Deficiency, Fractures)   PTH, intact and calcium     Follow up plan: Return in about 4 weeks (around 07/28/2021) for RLS and ANXIETY.

## 2021-06-30 NOTE — Assessment & Plan Note (Signed)
Chronic, ongoing, recheck level today and continue current supplement. May need to increase supplement dosing if remains <30. 

## 2021-06-30 NOTE — Assessment & Plan Note (Signed)
History of trial on various statins with side effect of myalgia, continue Zetia and tolerating Crestor at three days a week at this time.  Continue this and if any adverse effects will consider change to Repatha. 

## 2021-07-01 LAB — COMPREHENSIVE METABOLIC PANEL
ALT: 20 IU/L (ref 0–32)
AST: 28 IU/L (ref 0–40)
Albumin/Globulin Ratio: 2 (ref 1.2–2.2)
Albumin: 4.3 g/dL (ref 3.8–4.8)
Alkaline Phosphatase: 67 IU/L (ref 44–121)
BUN/Creatinine Ratio: 21 (ref 12–28)
BUN: 14 mg/dL (ref 8–27)
Bilirubin Total: 1.2 mg/dL (ref 0.0–1.2)
CO2: 25 mmol/L (ref 20–29)
Calcium: 9.2 mg/dL (ref 8.7–10.3)
Chloride: 102 mmol/L (ref 96–106)
Creatinine, Ser: 0.66 mg/dL (ref 0.57–1.00)
Globulin, Total: 2.1 g/dL (ref 1.5–4.5)
Glucose: 89 mg/dL (ref 70–99)
Potassium: 4 mmol/L (ref 3.5–5.2)
Sodium: 140 mmol/L (ref 134–144)
Total Protein: 6.4 g/dL (ref 6.0–8.5)
eGFR: 95 mL/min/{1.73_m2} (ref >59)

## 2021-07-01 LAB — LIPID PANEL W/O CHOL/HDL RATIO
Cholesterol, Total: 166 mg/dL (ref 100–199)
HDL: 55 mg/dL (ref >39)
LDL Chol Calc (NIH): 97 mg/dL (ref 0–99)
Triglycerides: 74 mg/dL (ref 0–149)
VLDL Cholesterol Cal: 14 mg/dL (ref 5–40)

## 2021-07-01 LAB — PTH, INTACT AND CALCIUM: PTH: 28 pg/mL (ref 15–65)

## 2021-07-01 LAB — CBC WITH DIFFERENTIAL/PLATELET
Basophils Absolute: 0.1 10*3/uL (ref 0.0–0.2)
Basos: 2 %
EOS (ABSOLUTE): 0.1 10*3/uL (ref 0.0–0.4)
Eos: 2 %
Hematocrit: 39.2 % (ref 34.0–46.6)
Hemoglobin: 12.9 g/dL (ref 11.1–15.9)
Immature Grans (Abs): 0 10*3/uL (ref 0.0–0.1)
Immature Granulocytes: 0 %
Lymphocytes Absolute: 1.9 10*3/uL (ref 0.7–3.1)
Lymphs: 36 %
MCH: 29.7 pg (ref 26.6–33.0)
MCHC: 32.9 g/dL (ref 31.5–35.7)
MCV: 90 fL (ref 79–97)
Monocytes Absolute: 0.4 10*3/uL (ref 0.1–0.9)
Monocytes: 8 %
Neutrophils Absolute: 2.7 10*3/uL (ref 1.4–7.0)
Neutrophils: 52 %
Platelets: 186 10*3/uL (ref 150–450)
RBC: 4.35 x10E6/uL (ref 3.77–5.28)
RDW: 12.6 % (ref 11.7–15.4)
WBC: 5.2 10*3/uL (ref 3.4–10.8)

## 2021-07-01 LAB — VITAMIN D 25 HYDROXY (VIT D DEFICIENCY, FRACTURES): Vit D, 25-Hydroxy: 28.1 ng/mL — ABNORMAL LOW (ref 30.0–100.0)

## 2021-07-01 LAB — MAGNESIUM: Magnesium: 2 mg/dL (ref 1.6–2.3)

## 2021-07-01 LAB — TSH: TSH: 2.55 u[IU]/mL (ref 0.450–4.500)

## 2021-07-01 NOTE — Progress Notes (Signed)
Contacted via MyChart   Good evening Patricia Ferguson, your labs have returned.  Overall everything remains stable on current medications.  Vitamin D remains a little low, continue supplement daily.  Any questions? Keep being amazing!!  Thank you for allowing me to participate in your care.  I appreciate you. Kindest regards, Hawley Pavia

## 2021-07-07 ENCOUNTER — Other Ambulatory Visit: Payer: Self-pay | Admitting: Nurse Practitioner

## 2021-07-07 DIAGNOSIS — Z1231 Encounter for screening mammogram for malignant neoplasm of breast: Secondary | ICD-10-CM

## 2021-08-03 ENCOUNTER — Ambulatory Visit: Payer: Medicare PPO | Admitting: Nurse Practitioner

## 2021-08-08 ENCOUNTER — Ambulatory Visit
Admission: RE | Admit: 2021-08-08 | Discharge: 2021-08-08 | Disposition: A | Discharge status: Home / Self Care | Payer: Medicare PPO | Source: Ambulatory Visit | Attending: Nurse Practitioner | Admitting: Nurse Practitioner

## 2021-08-08 ENCOUNTER — Other Ambulatory Visit: Payer: Self-pay

## 2021-08-08 DIAGNOSIS — Z1231 Encounter for screening mammogram for malignant neoplasm of breast: Secondary | ICD-10-CM | POA: Diagnosis present

## 2021-08-09 ENCOUNTER — Other Ambulatory Visit: Payer: Self-pay | Admitting: Nurse Practitioner

## 2021-08-09 DIAGNOSIS — R928 Other abnormal and inconclusive findings on diagnostic imaging of breast: Secondary | ICD-10-CM

## 2021-08-09 DIAGNOSIS — R921 Mammographic calcification found on diagnostic imaging of breast: Secondary | ICD-10-CM

## 2021-08-09 NOTE — Progress Notes (Signed)
Contacted via MyChart   Good afternoon Lihanna, your imaging returned noting need for further imaging to right breast.  They should be calling you to schedule, if not I recommend reaching out to them.  This is very common to have to return, I myself have to return this year for further imaging.  If any questions let me know. Keep being awesome!!  Thank you for allowing me to participate in your care.  I appreciate you. Kindest regards, Zarion Oliff

## 2021-08-18 ENCOUNTER — Ambulatory Visit
Admission: RE | Admit: 2021-08-18 | Discharge: 2021-08-18 | Disposition: A | Discharge status: Home / Self Care | Payer: Medicare PPO | Source: Ambulatory Visit | Attending: Nurse Practitioner | Admitting: Nurse Practitioner

## 2021-08-18 ENCOUNTER — Other Ambulatory Visit: Payer: Self-pay

## 2021-08-18 DIAGNOSIS — R928 Other abnormal and inconclusive findings on diagnostic imaging of breast: Secondary | ICD-10-CM | POA: Insufficient documentation

## 2021-08-18 DIAGNOSIS — R921 Mammographic calcification found on diagnostic imaging of breast: Secondary | ICD-10-CM | POA: Diagnosis present

## 2021-08-18 NOTE — Progress Notes (Signed)
Contacted via MyChart   Normal mammogram, repeat in one year:)

## 2021-08-26 ENCOUNTER — Ambulatory Visit: Payer: Medicare PPO | Admitting: Nurse Practitioner

## 2021-09-13 ENCOUNTER — Encounter: Payer: Self-pay | Admitting: Nurse Practitioner

## 2021-09-13 ENCOUNTER — Ambulatory Visit: Payer: Medicare PPO | Admitting: Nurse Practitioner

## 2021-09-13 ENCOUNTER — Other Ambulatory Visit: Payer: Self-pay

## 2021-09-13 VITALS — BP 98/60 | HR 83 | Temp 98.1°F | Ht 64.0 in | Wt 198.8 lb

## 2021-09-13 DIAGNOSIS — D696 Thrombocytopenia, unspecified: Secondary | ICD-10-CM | POA: Diagnosis not present

## 2021-09-13 DIAGNOSIS — F4323 Adjustment disorder with mixed anxiety and depressed mood: Secondary | ICD-10-CM | POA: Diagnosis not present

## 2021-09-13 DIAGNOSIS — E559 Vitamin D deficiency, unspecified: Secondary | ICD-10-CM | POA: Diagnosis not present

## 2021-09-13 DIAGNOSIS — R252 Cramp and spasm: Secondary | ICD-10-CM | POA: Diagnosis not present

## 2021-09-13 NOTE — Assessment & Plan Note (Signed)
States mood have drastically improved since last visit. No longer having stressors at home. She is going to therapy, which she states has helped. GAD 7 score was 9 last visit decreased to 0 (zero) today.  PHQ 9 score decreased from 13 last visit to 1 today. At this time does not wish to trial medication.  She denies SI/HI. Return in 8 weeks.

## 2021-09-13 NOTE — Assessment & Plan Note (Signed)
Sporadic noted on labs over past 5 years on occasion -- 147 and 148 range in the past. Was 186 06/30/21 recent labs stable. CBC w/ differential and platelet checked today.

## 2021-09-13 NOTE — Assessment & Plan Note (Signed)
More noted at night for years, worsening with increased stressors recently. States this has improved but still having sharp pain in left thigh. Imaging was ordered last visit, but have not heard from imaging. Will reorder imaging, low suspicion for DVT but will image for patient reassurance.  Will consider ordering lower back and hip x-ray in future visit. Was started on low dose Requip at [facility] which is helping. Continue with current regimen. CBC, CK, ferritin, TIBC  And B12 ordered today.  Return in 8 weeks.

## 2021-09-13 NOTE — Patient Instructions (Signed)
Restless Legs Syndrome ?Restless legs syndrome is a condition that causes uncomfortable feelings or sensations in the legs, especially while sitting or lying down. The sensations usually cause an overwhelming urge to move the legs. The arms can also sometimes be affected. ?The condition can range from mild to severe. The symptoms often interfere with a person's ability to sleep. ?What are the causes? ?The cause of this condition is not known. ?What increases the risk? ?The following factors may make you more likely to develop this condition: ?Being older than 50. ?Pregnancy. ?Being a woman. In general, the condition is more common in women than in men. ?A family history of the condition. ?Having iron deficiency. ?Overuse of caffeine, nicotine, or alcohol. ?Certain medical conditions, such as kidney disease, Parkinson's disease, or nerve damage. ?Certain medicines, such as those for high blood pressure, nausea, colds, allergies, depression, and some heart conditions. ?What are the signs or symptoms? ?The main symptom of this condition is uncomfortable sensations in the legs, such as: ?Pulling. ?Tingling. ?Prickling. ?Throbbing. ?Crawling. ?Burning. ?Usually, the sensations: ?Affect both sides of the body. ?Are worse when you sit or lie down. ?Are worse at night. These may make it difficult to fall asleep. ?Make you have a strong urge to move your legs. ?Are temporarily relieved by moving your legs or standing. ?The arms can also be affected, but this is rare. People who have this condition often have tiredness during the day because of their lack of sleep at night. ?How is this diagnosed? ?This condition may be diagnosed based on: ?Your symptoms. ?Blood tests. ?In some cases, you may be monitored in a sleep lab by a specialist (a sleep study). This can detect any disruptions in your sleep. ?How is this treated? ?This condition is treated by managing the symptoms. This may include: ?Lifestyle changes, such as  exercising, using relaxation techniques, and avoiding caffeine, alcohol, or tobacco. ?Iron supplements. ?Medicines. Parkinson's medications may be tried first. Anti-seizure medications can also be helpful. ?Follow these instructions at home: ?General instructions ?Take over-the-counter and prescription medicines only as told by your health care provider. ?Use methods to help relieve the uncomfortable sensations, such as: ?Massaging your legs. ?Walking or stretching. ?Taking a cold or hot bath. ?Keep all follow-up visits. This is important. ?Lifestyle ?  ?Practice good sleep habits. For example, go to bed and get up at the same time every day. Most adults should get 7-9 hours of sleep each night. ?Exercise regularly. Try to get at least 30 minutes of exercise most days of the week. ?Practice ways of relaxing, such as yoga or meditation. ?Avoid caffeine and alcohol. ?Do not use any products that contain nicotine or tobacco. These products include cigarettes, chewing tobacco, and vaping devices, such as e-cigarettes. If you need help quitting, ask your health care provider. ?Where to find more information ?National Institute of Neurological Disorders and Stroke: www.ninds.nih.gov ?Contact a health care provider if: ?Your symptoms get worse or they do not improve with treatment. ?Summary ?Restless legs syndrome is a condition that causes uncomfortable feelings or sensations in the legs, especially while sitting or lying down. ?The symptoms often interfere with your ability to sleep. ?This condition is treated by managing the symptoms. You may need to make lifestyle changes or take medicines. ?This information is not intended to replace advice given to you by your health care provider. Make sure you discuss any questions you have with your health care provider. ?Document Revised: 02/20/2021 Document Reviewed: 02/20/2021 ?Elsevier Patient Education ? 2022   [employer] ? ?

## 2021-09-13 NOTE — Progress Notes (Signed)
BP 98/60 (BP Location: Left Arm, Patient Position: Sitting)    Pulse 83    Temp 98.1 F (36.7 C)    Ht _0  (1.626 m)    Wt 90.2 kg    LMP  (LMP Unknown)    SpO2 93%    BMI 34.12 kg/m    Subjective:    Patient ID: Jodi Mourning, female    DOB: 1950-10-09, 71 y.o.   MRN: [id number]  NOTE WRITTEN BY [employer] DNP STUDENT.  ASSESSMENT AND PLAN OF CARE REVIEWED WITH STUDENT, AGREE WITH ABOVE FINDINGS AND PLAN.   HPI: KADEIDRA CORYELL is a 71 y.o. female here of follow up on restless leg syndrome and mood. She does not have any additional concerns today.  Chief Complaint  Patient presents with   RLS    Patient states she is doing better. Patient states she is asking if provider still wanting imaging of her legs as she was having cramping really bad and patient states she never heard from anyone in regards to the imaging.    Anxiety   LEG CRAMPS Follow-up for RLS like symptoms, started Requip last visit and ordered imaging, has not heard to schedule imaging yet.   Present for years, but lately gotten worse. Notices cramps has improved some but still have some pain during night. . Duration: chronic Pain: yes Severity: 9/10  Quality:  sharp and cramping "feels like a knife" Location:  left thigh Bilateral:  no Onset: gradual -- lasts about 15 minutes Frequency:  at least once a week Time of  day:   occurs mostly in the evening and night Sudden unintentional leg jerking:   no Paresthesias:  no Decreased sensation:  no Weakness:   no Insomnia:   no Fatigue:   no Alleviating factors: walking sometimes help Aggravating factors: unknown Status: no improvement Treatments attempted: Magnesium + Requip   DEPRESSION/ANXIETY Follow-up for anxiety.  Increased stressors at home -- husband's health doing poorly and he is drinking more alcohol.  Plus son had gotten divorced and is now back with the women he was married to, which has added stress as woman is not a nice person/using him. Patient  works at [employer].  State husband has stopped drinking alcohol and is going to [employer] which has helped and she attending therapy through [facility]. Mood status: controlled Symptom severity: mood have improved Psychotherapy/counseling: yes  Previous psychiatric medications: none Depressed mood: no Anxious mood: no Anhedonia: no Significant weight loss or gain: no Insomnia: yes, "not as bad" Fatigue: no  Feelings of worthlessness or guilt:  no Impaired concentration/indecisiveness: no Suicidal ideations: no  Hopelessness: no Crying spells: no  Depression screen [facility] 2/9 09/13/2021 09/13/2021 06/30/2021 03/18/2021 03/15/2020  Decreased Interest 0 0 2 0 0  Down, Depressed, Hopeless 0 0 2 0 0  PHQ - 2 Score 0 0 4 0 0  Altered sleeping 1 - 3 - -  Tired, decreased energy 0 - 1 - -  Change in appetite 0 - 1 - -  Feeling bad or failure about yourself  0 - 2 - -  Trouble concentrating 0 - 1 - -  Moving slowly or fidgety/restless 0 - 0 - -  Suicidal thoughts 0 - 1 - -  PHQ-9 Score 1 - 13 - -  Difficult doing work/chores - - Somewhat difficult - -    GAD 7 : Generalized Anxiety Score 09/13/2021 06/30/2021 03/05/2019  Nervous, Anxious, on Edge 0 2 0  Control/stop  worrying 0 2 0  Worry too much - different things 0 2 0  Trouble relaxing 0 2 0  Restless 0 0 0  Easily annoyed or irritable 0 1 0  Afraid - awful might happen 0 0 0  Total GAD 7 Score 0 9 0  Anxiety Difficulty - Somewhat difficult -    Relevant past medical, surgical, family and social history reviewed and updated as indicated. Interim medical history since our last visit reviewed. Allergies and medications reviewed and updated.  Review of Systems  Constitutional:  Negative for activity change, appetite change, chills, fatigue and unexpected weight change.  Eyes:  Negative for visual disturbance.  Respiratory:  Negative for chest tightness, shortness of breath and wheezing.   Cardiovascular:  Negative for chest pain,  palpitations and leg swelling.  Gastrointestinal:  Negative for diarrhea, nausea and vomiting.  Endocrine: Negative for cold intolerance and heat intolerance.  Genitourinary:  Negative for pelvic pain.  Musculoskeletal:  Positive for myalgias. Negative for joint swelling.       Sharp pain on left thigh  Neurological:  Negative for dizziness, seizures, syncope, weakness, light-headedness and headaches.  Psychiatric/Behavioral:  Negative for self-injury, sleep disturbance and suicidal ideas. The patient is not nervous/anxious.    Per HPI unless specifically indicated above     Objective:    BP 98/60 (BP Location: Left Arm, Patient Position: Sitting)    Pulse 83    Temp 98.1 F (36.7 C)    Ht _0  (1.626 m)    Wt 90.2 kg    LMP  (LMP Unknown)    SpO2 93%    BMI 34.12 kg/m   Wt Readings from Last 3 Encounters:  09/13/21 90.2 kg  06/30/21 89.6 kg  03/18/21 89.8 kg    Physical Exam Constitutional:      Appearance: Normal appearance. She is well-groomed. She is not ill-appearing.  HENT:     Head: Normocephalic.     Nose: Nose normal.  Eyes:     General: Lids are normal. No scleral icterus.       Right eye: No discharge.        Left eye: No discharge.     Conjunctiva/sclera: Conjunctivae normal.  Neck:     Vascular: No carotid bruit.  Cardiovascular:     Rate and Rhythm: Normal rate and regular rhythm.     Pulses: Normal pulses.          Dorsalis pedis pulses are 2+ on the right side and 2+ on the left side.     Heart sounds: Normal heart sounds. No murmur heard.   No gallop.  Pulmonary:     Effort: Pulmonary effort is normal. No accessory muscle usage or respiratory distress.     Breath sounds: Normal breath sounds. No wheezing or rhonchi.  Abdominal:     General: Bowel sounds are normal.  Musculoskeletal:        General: No swelling, tenderness or signs of injury.     Cervical back: Full passive range of motion without pain.     Right lower leg: No edema.     Left lower  leg: No edema.  Skin:    General: Skin is warm and dry.     Capillary Refill: Capillary refill takes less than 2 seconds.  Neurological:     Mental Status: She is alert and oriented to person, place, and time.     Motor: No weakness.  Psychiatric:  Attention and Perception: Attention normal.        Mood and Affect: Mood normal. Mood is not anxious or depressed.        Speech: Speech normal.        Behavior: Behavior normal. Behavior is cooperative.        Thought Content: Thought content normal.        Judgment: Judgment normal.    Results for orders placed or performed in visit on 06/30/21  CBC with Differential/Platelet  Result Value Ref Range   WBC 5.2 3.4 - 10.8 x10E3/uL   RBC 4.35 3.77 - 5.28 x10E6/uL   Hemoglobin 12.9 11.1 - 15.9 g/dL   Hematocrit 39.2 34.0 - 46.6 %   MCV 90 79 - 97 fL   MCH 29.7 26.6 - 33.0 pg   MCHC 32.9 31.5 - 35.7 g/dL   RDW 12.6 11.7 - 15.4 %   Platelets 186 150 - 450 x10E3/uL   Neutrophils 52 Not Estab. %   Lymphs 36 Not Estab. %   Monocytes 8 Not Estab. %   Eos 2 Not Estab. %   Basos 2 Not Estab. %   Neutrophils Absolute 2.7 1.4 - 7.0 x10E3/uL   Lymphocytes Absolute 1.9 0.7 - 3.1 x10E3/uL   Monocytes Absolute 0.4 0.1 - 0.9 x10E3/uL   EOS (ABSOLUTE) 0.1 0.0 - 0.4 x10E3/uL   Basophils Absolute 0.1 0.0 - 0.2 x10E3/uL   Immature Granulocytes 0 Not Estab. %   Immature Grans (Abs) 0.0 0.0 - 0.1 x10E3/uL  Comprehensive metabolic panel  Result Value Ref Range   Glucose 89 70 - 99 mg/dL   BUN 14 8 - 27 mg/dL   Creatinine, Ser 0.66 0.57 - 1.00 mg/dL   eGFR 95 >59 mL/min/1.73   BUN/Creatinine Ratio 21 12 - 28   Sodium 140 134 - 144 mmol/L   Potassium 4.0 3.5 - 5.2 mmol/L   Chloride 102 96 - 106 mmol/L   CO2 25 20 - 29 mmol/L   Calcium 9.2 8.7 - 10.3 mg/dL   Total Protein 6.4 6.0 - 8.5 g/dL   Albumin 4.3 3.8 - 4.8 g/dL   Globulin, Total 2.1 1.5 - 4.5 g/dL   Albumin/Globulin Ratio 2.0 1.2 - 2.2   Bilirubin Total 1.2 0.0 - 1.2 mg/dL    Alkaline Phosphatase 67 44 - 121 IU/L   AST 28 0 - 40 IU/L   ALT 20 0 - 32 IU/L  Lipid Panel w/o Chol/HDL Ratio  Result Value Ref Range   Cholesterol, Total 166 100 - 199 mg/dL   Triglycerides 74 0 - 149 mg/dL   HDL 55 >39 mg/dL   VLDL Cholesterol Cal 14 5 - 40 mg/dL   LDL Chol Calc (NIH) 97 0 - 99 mg/dL  VITAMIN D 25 Hydroxy (Vit-D Deficiency, Fractures)  Result Value Ref Range   Vit D, 25-Hydroxy 28.1 (L) 30.0 - 100.0 ng/mL  PTH, intact and calcium  Result Value Ref Range   PTH 28 15 - 65 pg/mL   PTH Interp Comment   Magnesium  Result Value Ref Range   Magnesium 2.0 1.6 - 2.3 mg/dL  TSH  Result Value Ref Range   TSH 2.550 0.450 - 4.500 uIU/mL      Assessment & Plan:   Problem List Items Addressed This Visit       Hematopoietic and Hemostatic   Thrombocytopenia ([place]) - Primary    Sporadic noted on labs over past 5 years on occasion -- 147 and 148 range in  the past. Was 186 06/30/21 recent labs stable. CBC w/ differential and platelet checked today.       Relevant Orders   CBC with Differential/Platelet     Other   Vitamin D deficiency   Situational mixed anxiety and depressive disorder    States mood have drastically improved since last visit. No longer having stressors at home. She is going to therapy, which she states has helped. GAD 7 score was 9 last visit decreased to 0 (zero) today.  PHQ 9 score decreased from 13 last visit to 1 today. At this time does not wish to trial medication.  She denies SI/HI. Return in 8 weeks.      Leg cramps    More noted at night for years, worsening with increased stressors recently. States this has improved but still having sharp pain in left thigh. Imaging was ordered last visit, but have not heard from imaging. Will reorder imaging, low suspicion for DVT but will image for patient reassurance.  Will consider ordering lower back and hip x-ray in future visit. Was started on low dose Requip at [facility] which is helping. Continue with  current regimen. CBC, CK, ferritin, TIBC  And B12 ordered today.  Return in 8 weeks.      Relevant Orders   CBC with Differential/Platelet   Iron Binding Cap (TIBC)([facility])   Ferritin   Vitamin B12   CK (Creatine Kinase)     Follow up plan: Return in about 8 weeks (around 11/08/2021) for RLS.

## 2021-09-14 ENCOUNTER — Encounter: Payer: Self-pay | Admitting: Nurse Practitioner

## 2021-09-14 DIAGNOSIS — E538 Deficiency of other specified B group vitamins: Secondary | ICD-10-CM | POA: Insufficient documentation

## 2021-09-14 LAB — CBC WITH DIFFERENTIAL/PLATELET
Basophils Absolute: 0.1 10*3/uL (ref 0.0–0.2)
Basos: 1 %
EOS (ABSOLUTE): 0.1 10*3/uL (ref 0.0–0.4)
Eos: 3 %
Hematocrit: 37.6 % (ref 34.0–46.6)
Hemoglobin: 12.4 g/dL (ref 11.1–15.9)
Immature Grans (Abs): 0 10*3/uL (ref 0.0–0.1)
Immature Granulocytes: 0 %
Lymphocytes Absolute: 1.7 10*3/uL (ref 0.7–3.1)
Lymphs: 34 %
MCH: 29.2 pg (ref 26.6–33.0)
MCHC: 33 g/dL (ref 31.5–35.7)
MCV: 89 fL (ref 79–97)
Monocytes Absolute: 0.4 10*3/uL (ref 0.1–0.9)
Monocytes: 8 %
Neutrophils Absolute: 2.6 10*3/uL (ref 1.4–7.0)
Neutrophils: 54 %
Platelets: 182 10*3/uL (ref 150–450)
RBC: 4.25 x10E6/uL (ref 3.77–5.28)
RDW: 12.3 % (ref 11.7–15.4)
WBC: 4.9 10*3/uL (ref 3.4–10.8)

## 2021-09-14 LAB — IRON AND TIBC
Iron Saturation: 20 % (ref 15–55)
Iron: 71 ug/dL (ref 27–139)
Total Iron Binding Capacity: 353 ug/dL (ref 250–450)
UIBC: 282 ug/dL (ref 118–369)

## 2021-09-14 LAB — VITAMIN B12: Vitamin B-12: 199 pg/mL — ABNORMAL LOW (ref 232–1245)

## 2021-09-14 LAB — CK: Total CK: 148 U/L (ref 32–182)

## 2021-09-14 LAB — FERRITIN: Ferritin: 24 ng/mL (ref 15–150)

## 2021-09-14 NOTE — Progress Notes (Signed)
Contacted via MyChart   Good morning Patricia Ferguson, your labs have returned and are overall normal with exception of B12 level being low, which may be leading to some of your leg discomfort.  B12 is important for nervous system health.  I would recommend starting to take Vitamin B12 1000 MCG daily and we will assess symptoms next visit to see how you feel.  Any questions on this? Keep being awesome!!  Thank you for allowing me to participate in your care.  I appreciate you. Kindest regards, Lacoya Wilbanks

## 2021-09-15 ENCOUNTER — Other Ambulatory Visit: Payer: Self-pay | Admitting: Nurse Practitioner

## 2021-09-15 ENCOUNTER — Ambulatory Visit
Admission: RE | Admit: 2021-09-15 | Discharge: 2021-09-15 | Disposition: A | Discharge status: Home / Self Care | Payer: Medicare PPO | Source: Ambulatory Visit | Attending: Nurse Practitioner | Admitting: Nurse Practitioner

## 2021-09-15 ENCOUNTER — Other Ambulatory Visit: Payer: Self-pay

## 2021-09-15 DIAGNOSIS — R252 Cramp and spasm: Secondary | ICD-10-CM | POA: Diagnosis present

## 2021-09-15 DIAGNOSIS — M79652 Pain in left thigh: Secondary | ICD-10-CM

## 2021-09-15 NOTE — Progress Notes (Signed)
Contacted via [place], there is no clot to left lower leg.  I would recommend next steps be we obtain imaging of left hip and lower back to further assess.  I will order these and recommend when you are able to head over to [facility] outpatient imaging center.  No appointment needed, you can go in at your convenience.     [place], [place], [place] [id number]  Any questions? Keep being stellar!!  Thank you for allowing me to participate in your care.  I appreciate you. Kindest regards, Amiera Herzberg

## 2021-09-16 ENCOUNTER — Ambulatory Visit
Admission: RE | Admit: 2021-09-16 | Discharge: 2021-09-16 | Disposition: A | Discharge status: Home / Self Care | Payer: Medicare PPO | Source: Ambulatory Visit | Attending: Nurse Practitioner | Admitting: Nurse Practitioner

## 2021-09-16 ENCOUNTER — Encounter: Payer: Self-pay | Admitting: Nurse Practitioner

## 2021-09-16 ENCOUNTER — Ambulatory Visit
Admission: RE | Admit: 2021-09-16 | Discharge: 2021-09-16 | Disposition: A | Discharge status: Home / Self Care | Payer: Medicare PPO | Attending: Nurse Practitioner | Admitting: Nurse Practitioner

## 2021-09-16 ENCOUNTER — Other Ambulatory Visit: Payer: Self-pay | Admitting: Nurse Practitioner

## 2021-09-16 DIAGNOSIS — R252 Cramp and spasm: Secondary | ICD-10-CM | POA: Insufficient documentation

## 2021-09-16 NOTE — Telephone Encounter (Signed)
Requested Prescriptions  Pending Prescriptions Disp Refills   ezetimibe (ZETIA) 10 MG tablet [Pharmacy Med Name: EZETIMIBE 10 MG TAB] 90 tablet 3    Sig: TAKE 1 TABLET BY MOUTH ONCE DAILY     Cardiovascular:  Antilipid - Sterol Transport Inhibitors Failed - 09/16/2021 10:11 AM      Failed - Lipid Panel in normal range within the last 12 months    Cholesterol, Total  Date Value Ref Range Status  06/30/2021 166 100 - 199 mg/dL Final   LDL Chol Calc (NIH)  Date Value Ref Range Status  06/30/2021 97 0 - 99 mg/dL Final   HDL  Date Value Ref Range Status  06/30/2021 55 >39 mg/dL Final   Triglycerides  Date Value Ref Range Status  06/30/2021 74 0 - 149 mg/dL Final         Passed - AST in normal range and within 360 days    AST  Date Value Ref Range Status  06/30/2021 28 0 - 40 IU/L Final         Passed - ALT in normal range and within 360 days    ALT  Date Value Ref Range Status  06/30/2021 20 0 - 32 IU/L Final         Passed - Patient is not pregnant      Passed - Valid encounter within last 12 months    Recent Outpatient Visits          3 days ago Thrombocytopenia ([place])   [place], [place] T, NP   2 months ago Thrombocytopenia ([place])   [place], [place] T, NP   8 months ago Thrombocytopenia ([place])   [place], [place] T, NP   1 year ago Mixed hyperlipidemia   [facility] [place], [place] T, NP   1 year ago Routine general medical examination at a health care facility   [facility], Barbaraann Faster, NP      Future Appointments            In 1 month [place], Barbaraann Faster, NP [facility]   In 6 months  [facility]

## 2021-09-19 NOTE — Progress Notes (Signed)
Contacted via MyChart   Good afternoon Patricia Ferguson, I have hip imaging back.  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  The hip itself is stable.  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  Your hardware from past surgery is noted to lower back.  Any questions?  I am waiting on lower back imaging, but suspect a bit of physical therapy would be helpful for you.

## 2021-09-20 ENCOUNTER — Ambulatory Visit: Payer: Medicare PPO

## 2021-09-30 ENCOUNTER — Other Ambulatory Visit: Payer: Self-pay | Admitting: Nurse Practitioner

## 2021-09-30 NOTE — Telephone Encounter (Signed)
Requested Prescriptions  ?Pending Prescriptions Disp Refills  ?? famotidine (PEPCID) 20 MG tablet [Pharmacy Med Name: FAMOTIDINE 20 MG TAB] 90 tablet 3  ?  Sig: TAKE 1 TABLET BY MOUTH ONCE DAILY  ?  ? Gastroenterology:  H2 Antagonists Passed - 09/30/2021 10:19 AM  ?  ?  Passed - Valid encounter within last 12 months  ?  Recent Outpatient Visits   ?      ? 2 weeks ago Thrombocytopenia ([place])  ? [place], [place] T, NP  ? 3 months ago Thrombocytopenia ([place])  ? [place], [place] T, NP  ? 9 months ago Thrombocytopenia ([place])  ? [place], Henrine Screws T, NP  ? 1 year ago Mixed hyperlipidemia  ? [place], Henrine Screws T, NP  ? 1 year ago Routine general medical examination at a health care facility  ? [facility] [place], Henrine Screws T, NP  ?  ?  ?Future Appointments   ?        ? In 1 month Cannady, Barbaraann Faster, NP [facility]  ? In 5 months  [place]  ?  ? ?  ?  ?  ? ?

## 2021-10-03 ENCOUNTER — Encounter: Payer: Self-pay | Admitting: Nurse Practitioner

## 2021-10-03 ENCOUNTER — Emergency Department
Admission: EM | Admit: 2021-10-03 | Discharge: 2021-10-03 | Disposition: A | Discharge status: Home / Self Care | Payer: Medicare PPO | Attending: Emergency Medicine | Admitting: Emergency Medicine

## 2021-10-03 ENCOUNTER — Other Ambulatory Visit: Payer: Self-pay

## 2021-10-03 ENCOUNTER — Emergency Department: Payer: Medicare PPO

## 2021-10-03 DIAGNOSIS — M79605 Pain in left leg: Secondary | ICD-10-CM | POA: Insufficient documentation

## 2021-10-03 DIAGNOSIS — M79662 Pain in left lower leg: Secondary | ICD-10-CM | POA: Diagnosis not present

## 2021-10-03 NOTE — ED Provider Notes (Signed)
? ?[facility] ?Provider Note ? ? ? Event Date/Time  ? First MD Initiated Contact with Patient 10/03/21 1501   ?  (approximate) ? ? ?History  ? ?Chief Complaint ?Leg Pain ? ? ?HPI ?Patricia Ferguson is a 71 y.o. female, thrombocytopenia, osteopenia, hyperlipidemia, leg cramps, presents to the emergency department for evaluation of leg pain.  Patient states that she began experiencing pain behind her left calf starting yesterday.  She states that she has a history of leg cramps, however this 1 has not gone away.  Describes pain as dull, 7/10, constant.  Hurts worse when walking.  Denies fever/chills, chest pain, shortness of breath, numbness/tingling in upper or lower extremities, cold sensation in affected extremity, urinary symptoms, nausea/vomiting, or weakness/dizziness. ? ?History Limitations: No limitations. ? ?  ? ? ?Physical Exam  ?Triage Vital Signs: ?ED Triage Vitals  ?Enc Vitals Group  ?   BP 10/03/21 1446 (!) 144/86  ?   Pulse Rate 10/03/21 1446 75  ?   Resp 10/03/21 1446 18  ?   Temp 10/03/21 1446 98.3 ?F (36.8 ?C)  ?   Temp Source 10/03/21 1446 Oral  ?   SpO2 10/03/21 1446 98 %  ?   Weight 10/03/21 1449 196 lb (88.9 kg)  ?   Height 10/03/21 1449 '5\' 4"'$  (1.626 m)  ?   Head Circumference --   ?   Peak Flow --   ?   Pain Score 10/03/21 1449 8  ?   Pain Loc --   ?   Pain Edu? --   ?   Excl. in [place]? --   ? ? ?Most recent vital signs: ?Vitals:  ? 10/03/21 1446  ?BP: (!) 144/86  ?Pulse: 75  ?Resp: 18  ?Temp: 98.3 ?F (36.8 ?C)  ?SpO2: 98%  ? ? ?General: Awake, NAD.  ?CV: Good peripheral perfusion.  ?Resp: Normal effort.  ?Abd: Soft, non-tender. No distention.  ?Neuro: At baseline. No gross neurological deficits.  ?Other: Notable tightness in the left gastrocnemius region.  No warmth, erythema, or tenderness to palpation.  Pulses intact distally.  Perfusion verified with Doppler.  Normal cap refill.  No remarkable swelling compared to the right lower extremity. ? ?Physical Exam ? ? ? ?ED Results  / Procedures / Treatments  ?Labs ?(all labs ordered are listed, but only abnormal results are displayed) ?Labs Reviewed - No data to display ? ? ?EKG ?Not applicable ? ? ?RADIOLOGY ? ?ED Provider Interpretation: I personally reviewed and interpreted this image.  No evidence of DVT on this ultrasound. ? ?US Venous Img Lower  Left (DVT Study) ? ?Result Date: 10/03/2021 ?CLINICAL DATA:  Left calf pain for 1 day. EXAM: LEFT LOWER EXTREMITY VENOUS DOPPLER ULTRASOUND TECHNIQUE: Gray-scale sonography with compression, as well as color and duplex ultrasound, were performed to evaluate the deep venous system(s) from the level of the common femoral vein through the popliteal and proximal calf veins. COMPARISON:  None. FINDINGS: VENOUS Normal compressibility of the common femoral, superficial femoral, and popliteal veins, as well as the visualized calf veins. Visualized portions of profunda femoral vein and great saphenous vein unremarkable. No filling defects to suggest DVT on grayscale or color Doppler imaging. Doppler waveforms show normal direction of venous flow, normal respiratory plasticity and response to augmentation. Limited views of the contralateral common femoral vein are unremarkable. OTHER None. Limitations: none IMPRESSION: No findings of left lower extremity deep vein thrombosis. Electronically Signed   By: Cindra Eves.D.  On: 10/03/2021 15:47   ? ?PROCEDURES: ? ?Critical Care performed: None ? ?Procedures ? ? ? ?MEDICATIONS ORDERED IN ED: ?Medications - No data to display ? ? ?IMPRESSION / MDM / ASSESSMENT AND PLAN / ED COURSE  ?I reviewed the triage vital signs and the nursing notes. ?             ?               ? ?Differential diagnosis includes, but is not limited to, DVT, PAD, arterial occlusion, gastrocnemius strain, Baker's cyst, ? ?ED Course ?Patient appears well.  Vital signs within normal limits.  NAD. ? ?Ultrasound shows no evidence of DVT. ? ?Assessment/Plan ?Presentation consistent with  gastrocnemius sprain/tear.  Ultrasound reassuring for no DVT.  No warmth, erythema, or tenderness to suggest infection.  Physical exam not suggestive of arterial occlusion.  Patient does not have any significant risk factors for PAD.  We will plan to discharge this patient with primary care follow-up.  Provide the patient with anticipatory guidance, return precautions, and educational material encouraged the patient to return the emergency department anytime if the patient begins to experience any new or worsening symptoms ? ? ? ?FINAL CLINICAL IMPRESSION(S) / ED DIAGNOSES  ? ?Final diagnoses:  ?Left leg pain  ? ? ? ?Rx / DC Orders  ? ?ED Discharge Orders   ? ? None  ? ?  ? ? ? ?Note:  This document was prepared using Dragon voice recognition software and may include unintentional dictation errors. ?  ?Teodoro Spray, [place] ?10/03/21 2238 ? ?  ?Delman Kitten, MD ?10/03/21 2351 ? ?

## 2021-10-03 NOTE — Discharge Instructions (Addendum)
-  With your primary care provider as discussed. ?-Rest the affected extremity, ice, and take Tylenol/ibuprofen as needed.  Massage frequently. ?-Return to the emergency department anytime if you begin to experience any new or worsening symptoms ?

## 2021-10-03 NOTE — ED Triage Notes (Signed)
Pt here from [facility] with left calf pain that started yesterday. Pt denies injury. Pt denies warmth to affected leg and is still able to ambulate. Pt stable in triage.  ?

## 2021-11-10 NOTE — Patient Instructions (Signed)
Restless Legs Syndrome Restless legs syndrome is a condition that causes uncomfortable feelings or sensations in the legs, especially while sitting or lying down. The sensations usually cause an overwhelming urge to move the legs. The arms can also sometimes be affected. The condition can range from mild to severe. The symptoms often interfere with a person's ability to sleep. What are the causes? The cause of this condition is not known. What increases the risk? The following factors may make you more likely to develop this condition: Being older than 50. Pregnancy. Being a woman. In general, the condition is more common in women than in men. A family history of the condition. Having iron deficiency. Overuse of caffeine, nicotine, or alcohol. Certain medical conditions, such as kidney disease, Parkinson's disease, or nerve damage. Certain medicines, such as those for high blood pressure, nausea, colds, allergies, depression, and some heart conditions. What are the signs or symptoms? The main symptom of this condition is uncomfortable sensations in the legs, such as: Pulling. Tingling. Prickling. Throbbing. Crawling. Burning. Usually, the sensations: Affect both sides of the body. Are worse when you sit or lie down. Are worse at night. These may make it difficult to fall asleep. Make you have a strong urge to move your legs. Are temporarily relieved by moving your legs or standing. The arms can also be affected, but this is rare. People who have this condition often have tiredness during the day because of their lack of sleep at night. How is this diagnosed? This condition may be diagnosed based on: Your symptoms. Blood tests. In some cases, you may be monitored in a sleep lab by a specialist (a sleep study). This can detect any disruptions in your sleep. How is this treated? This condition is treated by managing the symptoms. This may include: Lifestyle changes, such as  exercising, using relaxation techniques, and avoiding caffeine, alcohol, or tobacco. Iron supplements. Medicines. Parkinson's medications may be tried first. Anti-seizure medications can also be helpful. Follow these instructions at home: General instructions Take over-the-counter and prescription medicines only as told by your health care provider. Use methods to help relieve the uncomfortable sensations, such as: Massaging your legs. Walking or stretching. Taking a cold or hot bath. Keep all follow-up visits. This is important. Lifestyle     Practice good sleep habits. For example, go to bed and get up at the same time every day. Most adults should get 7-9 hours of sleep each night. Exercise regularly. Try to get at least 30 minutes of exercise most days of the week. Practice ways of relaxing, such as yoga or meditation. Avoid caffeine and alcohol. Do not use any products that contain nicotine or tobacco. These products include cigarettes, chewing tobacco, and vaping devices, such as e-cigarettes. If you need help quitting, ask your health care provider. Where to find more information National Institute of Neurological Disorders and Stroke: www.ninds.nih.gov Contact a health care provider if: Your symptoms get worse or they do not improve with treatment. Summary Restless legs syndrome is a condition that causes uncomfortable feelings or sensations in the legs, especially while sitting or lying down. The symptoms often interfere with your ability to sleep. This condition is treated by managing the symptoms. You may need to make lifestyle changes or take medicines. This information is not intended to replace advice given to you by your health care provider. Make sure you discuss any questions you have with your health care provider. Document Revised: 02/20/2021 Document Reviewed: 02/20/2021 Elsevier Patient Education    2023 Elsevier Inc.  

## 2021-11-11 ENCOUNTER — Ambulatory Visit: Payer: Medicare PPO | Admitting: Nurse Practitioner

## 2021-11-11 ENCOUNTER — Encounter: Payer: Self-pay | Admitting: Nurse Practitioner

## 2021-11-11 VITALS — BP 93/62 | HR 57 | Temp 98.1°F | Ht 64.0 in | Wt 196.8 lb

## 2021-11-11 DIAGNOSIS — E538 Deficiency of other specified B group vitamins: Secondary | ICD-10-CM

## 2021-11-11 DIAGNOSIS — R252 Cramp and spasm: Secondary | ICD-10-CM

## 2021-11-11 MED ORDER — ROPINIROLE HCL 0.5 MG PO TABS
0.5000 mg | ORAL_TABLET | Freq: Every day | ORAL | 4 refills | Status: DC
Start: 2021-11-11 — End: 2022-01-16

## 2021-11-11 NOTE — Assessment & Plan Note (Signed)
Chronic, ongoing with worsening over past months.  At this time increase Requip to 0.5 MG at night and continue Magnesium and B12 supplements.  Will obtain lumbar imaging to assess for causal factor, has history of fusion in 2002.  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  Return in 7 weeks. ?

## 2021-11-11 NOTE — Progress Notes (Signed)
? ?Established Patient Office Visit ? ?Subjective   ?Patient ID: Patricia Ferguson, female    DOB: [birth year]  Age: 71 y.o. MRN: [id number] ? ?Chief Complaint  ?Patient presents with  ? Restless Leg Syndrome  ?  Patient is here to follow up on RLS. Patient states she seems to feel everything is going a lot better.   ? ? ?HPI ? ?LEG CRAMPS ?Follow-up for RLS symptoms, started Requip 06/30/21 and ordered imaging, had imaging on 09/15/21 which showed no DVT, had repeat imaging in ER on 10/03/21 with no DVT noted.   ?Present for years, but has gotten worse over past months.  Has improved some with Requip and Magnesium.  History of lumbar fusion in 2002.  Worse discomfort when lying down. ? ?Last labs did note lower B12 level at 199.  She is taking supplement. ?Duration: chronic ?Pain: yes ?Severity: 8/10 at worst, today no pain ?Quality:  sharp and cramping "feels like a knife" ?Location: left thigh and lower leg ?Bilateral: no -- occasional to right, but not often ?Onset: gradual -- lasts about 15 minutes ?Frequency:  at least once a week ?Time of  day:  occurs mostly in the evening and night when in bed ?Sudden unintentional leg jerking:  no ?Paresthesias:  no ?Decreased sensation:  no ?Weakness:   no ?Insomnia:   no ?Fatigue:   no ?Alleviating factors: walking sometimes help, Requip ?Aggravating factors: unknown -- worse at night when lying down ?Status: some improvement ?Treatments attempted: Magnesium + Requip ?  ?Patient Active Problem List  ? Diagnosis Date Noted  ? Vitamin B 12 deficiency 09/14/2021  ? Situational mixed anxiety and depressive disorder 06/30/2021  ? Leg cramps 06/30/2021  ? Vitamin D deficiency 12/29/2020  ? Thrombocytopenia ([place]) 12/24/2020  ? Myalgia due to statin 05/05/2020  ? Heart burn 10/03/2019  ? Advanced care planning/counseling discussion 02/23/2017  ? Osteopenia 02/17/2015  ? Internal hemorrhoids 02/17/2015  ? Colonic polyp 02/17/2015  ? Hyperlipidemia 02/17/2015  ? Allergic rhinitis  02/17/2015  ? Obesity 02/17/2015  ? ?Past Medical History:  ?Diagnosis Date  ? Allergy   ? Arthritis   ? right knee  ? FH: colonic polyps   ? GERD (gastroesophageal reflux disease)   ? Hypercholesterolemia   ? Internal hemorrhoids   ? Obesity   ? Osteopenia   ? 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 Walking pneumonia   ? ?Past Surgical History:  ?Procedure Laterality Date  ? ABDOMINAL HYSTERECTOMY    ? ANAL FISSURE REPAIR  1980's  ? APPENDECTOMY    ? BACK SURGERY  2000  ? lumbar fusion. SCREWS IN BACK  ? bone spur    ? BREAST BIOPSY Right 1980's  ? BREAST SURGERY    ? CERVICAL CONE BIOPSY    ? lumbar fission    ? MASS EXCISION Left 01/07/2020  ? Procedure: EXCISION MASS (RESECTION OF ANTECUBITAL VENOUS ANEURYSM);  Surgeon: Katha Cabal, MD;  Location: [facility] ORS;  Service: Vascular;  Laterality: Left;  ? ?Social History  ? ?Socioeconomic History  ? Marital status: Married  ?  Spouse name: Jeneen Rinks  ? Number of children: Not on file  ? Years of education: some college   ? Highest education level: High school graduate  ?Occupational History  ? Occupation: [employer] part time   ?  Comment: program assistant  ?Tobacco Use  ? Smoking status: Never  ? Smokeless tobacco: Never  ?Vaping Use  ? Vaping Use: Never used  ?Substance  and Sexual Activity  ? Alcohol use: Not Currently  ?  Alcohol/week: 0.0 standard drinks  ?  Comment: on occasion  ? Drug use: No  ? Sexual activity: Not Currently  ?Other Topics Concern  ? Not on file  ?Social History Narrative  ? Working part time .   ? Lives with husband.  ? ?Social Determinants of Health  ? ?Financial Resource Strain: Low Risk   ? Difficulty of Paying Living Expenses: Not hard at all  ?Food Insecurity: No Food Insecurity  ? Worried About Charity fundraiser in the Last Year: Never true  ? Ran Out of Food in the Last Year: Never true  ?Transportation Needs: No Transportation Needs  ? Lack of Transportation (Medical): No  ? Lack of Transportation (Non-Medical): No   ?Physical Activity: Inactive  ? Days of Exercise per Week: 0 days  ? Minutes of Exercise per Session: 0 min  ?Stress: Stress Concern Present  ? Feeling of Stress : To some extent  ?Social Connections: Not on file  ?Intimate Partner Violence: Not on file  ? ?Allergies  ?Allergen Reactions  ? Adhesive [Tape] Rash  ?  PAPER TAPE OKAY  ? Lipitor [Atorvastatin] Other (See Comments)  ?  Muscle aches  ? Oxycodone Rash  ? Percodan [Oxycodone-Aspirin] Rash  ? Pravachol [Pravastatin Sodium] Other (See Comments)  ?  Muscle aches ?  ? Propoxyphene Other (See Comments)  ?  depression  ? ?  ? ?Review of Systems  ?Constitutional:  Negative for chills, fever and malaise/fatigue.  ?Respiratory: Negative.    ?Cardiovascular: Negative.   ?Skin: Negative.   ?Neurological: Negative.   ?Psychiatric/Behavioral: Negative.    ? ?  ?Objective:  ?  ? ?BP 93/62   Pulse (!) 57   Temp 98.1 ?F (36.7 ?C) (Oral)   Ht 5' 4" (1.626 m)   Wt 196 lb 12.8 oz (89.3 kg)   LMP  (LMP Unknown)   SpO2 92%   BMI 33.78 kg/m?  ?BP Readings from Last 3 Encounters:  ?11/11/21 93/62  ?10/03/21 (!) 144/86  ?09/13/21 98/60  ? ?Wt Readings from Last 3 Encounters:  ?11/11/21 196 lb 12.8 oz (89.3 kg)  ?10/03/21 196 lb (88.9 kg)  ?09/13/21 198 lb 12.8 oz (90.2 kg)  ? ? ?Physical Exam ?Vitals and nursing note reviewed.  ?Constitutional:   ?   General: She is awake.  ?   Appearance: Normal appearance. She is well-developed and well-groomed. She is not ill-appearing.  ?HENT:  ?   Head: Normocephalic.  ?   Right Ear: Hearing normal.  ?   Left Ear: Hearing normal.  ?   Nose: Nose normal.  ?   Mouth/Throat:  ?   Mouth: Mucous membranes are moist.  ?Eyes:  ?   General: Lids are normal. No scleral icterus.    ?   Right eye: No discharge.     ?   Left eye: No discharge.  ?   Conjunctiva/sclera: Conjunctivae normal.  ?   Pupils: Pupils are equal, round, and reactive to light.  ?Neck:  ?   Thyroid: No thyromegaly.  ?   Vascular: No carotid bruit or JVD.  ?Cardiovascular:  ?    Rate and Rhythm: Normal rate and regular rhythm.  ?   Pulses: Normal pulses.     ?     Dorsalis pedis pulses are 2+ on the right side and 2+ on the left side.  ?     Posterior tibial pulses are 2+  on the right side and 2+ on the left side.  ?   Heart sounds: Normal heart sounds. No murmur heard. ?  No gallop.  ?   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 ?Pulmonary:  ?   Effort: Pulmonary effort is normal. No accessory muscle usage or respiratory distress.  ?   Breath sounds: Normal breath sounds. No wheezing or rhonchi.  ?Abdominal:  ?   General: Bowel sounds are normal.  ?   Palpations: Abdomen is soft. There is no hepatomegaly or splenomegaly.  ?Musculoskeletal:     ?   General: No swelling, tenderness or signs of injury.  ?   Cervical back: Full passive range of motion without pain, normal range of motion and neck supple.  ?   Right lower leg: No edema.  ?   Left lower leg: No edema.  ?Lymphadenopathy:  ?   Cervical: No cervical adenopathy.  ?Skin: ?   General: Skin is warm and dry.  ?   Capillary Refill: Capillary refill takes less than 2 seconds.  ?Neurological:  ?   Mental Status: She is alert and oriented to person, place, and time.  ?   Motor: No weakness.  ?Psychiatric:     ?   Attention and Perception: Attention normal.     ?   Mood and Affect: Mood normal. Mood is not anxious or depressed.     ?   Speech: Speech normal.     ?   Behavior: Behavior normal. Behavior is cooperative.     ?   Thought Content: Thought content normal.     ?   Judgment: Judgment normal.  ? ?Last CBC ?Lab Results  ?Component Value Date  ? WBC 4.9 09/13/2021  ? HGB 12.4 09/13/2021  ? HCT 37.6 09/13/2021  ? MCV 89 09/13/2021  ? MCH 29.2 09/13/2021  ? RDW 12.3 09/13/2021  ? PLT 182 09/13/2021  ? ?Last metabolic panel ?Lab Results  ?Component Value Date  ? GLUCOSE 89 06/30/2021  ? NA 140 06/30/2021  ? K 4.0 06/30/2021  ? CL 102 06/30/2021  ? CO2 25 06/30/2021  ? BUN 14 06/30/2021  ? CREATININE 0.66 06/30/2021  ? EGFR 95  06/30/2021  ? CALCIUM 9.2 06/30/2021  ? PROT 6.4 06/30/2021  ? ALBUMIN 4.3 06/30/2021  ? LABGLOB 2.1 06/30/2021  ? AGRATIO 2.0 06/30/2021  ? BILITOT 1.2 06/30/2021  ? ALKPHOS 67 06/30/2021  ? AST 28 06/30/2021  ? ALT 20 12/08/2

## 2021-11-11 NOTE — Assessment & Plan Note (Signed)
Noted on labs 09/14/21.  Continue supplement and recheck next visit. ?

## 2021-11-14 ENCOUNTER — Ambulatory Visit
Admission: RE | Admit: 2021-11-14 | Discharge: 2021-11-14 | Disposition: A | Discharge status: Home / Self Care | Payer: Medicare PPO | Attending: Nurse Practitioner | Admitting: Nurse Practitioner

## 2021-11-14 ENCOUNTER — Ambulatory Visit
Admission: RE | Admit: 2021-11-14 | Discharge: 2021-11-14 | Disposition: A | Discharge status: Home / Self Care | Payer: Medicare PPO | Source: Ambulatory Visit | Attending: Nurse Practitioner | Admitting: Nurse Practitioner

## 2021-11-14 DIAGNOSIS — M47816 Spondylosis without myelopathy or radiculopathy, lumbar region: Secondary | ICD-10-CM | POA: Diagnosis not present

## 2021-11-14 DIAGNOSIS — R252 Cramp and spasm: Secondary | ICD-10-CM

## 2021-11-14 DIAGNOSIS — M4316 Spondylolisthesis, lumbar region: Secondary | ICD-10-CM | POA: Diagnosis not present

## 2021-11-14 DIAGNOSIS — M8588 Other specified disorders of bone density and structure, other site: Secondary | ICD-10-CM | POA: Diagnosis not present

## 2021-11-15 ENCOUNTER — Encounter: Payer: Self-pay | Admitting: Nurse Practitioner

## 2021-11-15 DIAGNOSIS — I700 Atherosclerosis of aorta: Secondary | ICD-10-CM | POA: Insufficient documentation

## 2021-11-15 NOTE — Progress Notes (Signed)
Contacted via [facility] ? ? ?Good evening [name], your imaging has returned.  Fusion is noted with intact hardware.  There is some diffuse thinning of bone, osteopenia, which we are aware of via your bone density screening.  Continue Vitamin D supplement.  At L4 to L5, lower spine, there is abnormal alignment of the spine, this could be causing some of your discomfort if a nerve is getting pinched in here.  Possibly a small right kidney stone is present, but this should not cause current symptoms.  Any questions? ?Keep being amazing!!  Thank you for allowing me to participate in your care.  I appreciate you. ?Kindest regards, ?Salsabeel Gorelick ?

## 2021-11-22 DIAGNOSIS — H16223 Keratoconjunctivitis sicca, not specified as Sjogren's, bilateral: Secondary | ICD-10-CM | POA: Diagnosis not present

## 2021-11-22 DIAGNOSIS — Z0100 Encounter for examination of eyes and vision without abnormal findings: Secondary | ICD-10-CM | POA: Diagnosis not present

## 2021-12-12 ENCOUNTER — Ambulatory Visit (INDEPENDENT_AMBULATORY_CARE_PROVIDER_SITE_OTHER): Payer: Medicare PPO | Admitting: Vascular Surgery

## 2021-12-12 ENCOUNTER — Encounter (INDEPENDENT_AMBULATORY_CARE_PROVIDER_SITE_OTHER): Payer: Self-pay | Admitting: Vascular Surgery

## 2021-12-12 VITALS — BP 145/81 | HR 75 | Resp 16 | Wt 198.0 lb

## 2021-12-12 DIAGNOSIS — M79604 Pain in right leg: Secondary | ICD-10-CM

## 2021-12-12 DIAGNOSIS — R252 Cramp and spasm: Secondary | ICD-10-CM | POA: Diagnosis not present

## 2021-12-12 DIAGNOSIS — E782 Mixed hyperlipidemia: Secondary | ICD-10-CM

## 2021-12-12 DIAGNOSIS — M79605 Pain in left leg: Secondary | ICD-10-CM | POA: Diagnosis not present

## 2021-12-17 ENCOUNTER — Encounter (INDEPENDENT_AMBULATORY_CARE_PROVIDER_SITE_OTHER): Payer: Self-pay | Admitting: Vascular Surgery

## 2021-12-17 ENCOUNTER — Other Ambulatory Visit: Payer: Self-pay | Admitting: Nurse Practitioner

## 2021-12-17 NOTE — Progress Notes (Signed)
MRN : [id number]  Patricia Ferguson is a 71 y.o. (Jan 27, 1951) female who presents with chief complaint of legs hurt.  History of Present Illness:   The patient is seen for evaluation of painful lower extremities. The leg pain occurs primarily at night while the patient is in bed.   The patient describes it as a cramping or Charley horse type pain. The patient notes the pain isn't associated with activity and is not very consistent day to day. The pain seems to be variable with time. Typically the pain occurs with varying positions and seems to progress until the leg is stretched. The pain has been progressive over the past several years which has prompted the concern for evaluation. The patient states this inability to walk has a significant negative impact on her quality of life and daily activities.  The patient denies a history of degenerative spine disease.  The patient denies rest pain. The patient denies dangling off the affected extremity during the night for pain relief. There are no open wounds or sores at this time.  No prior vascular interventions or vascular surgeries.  The patient denies amaurosis fugax or recent TIA symptoms. There are no recent neurological changes noted. There is no history of DVT, PE or superficial thrombophlebitis. No recent episodes of angina or shortness of breath documented.    Current Meds  Medication Sig   ezetimibe (ZETIA) 10 MG tablet TAKE 1 TABLET BY MOUTH ONCE DAILY   famotidine (PEPCID) 20 MG tablet TAKE 1 TABLET BY MOUTH ONCE DAILY   hydroxypropyl methylcellulose / hypromellose (ISOPTO TEARS / GONIOVISC) 2.5 % ophthalmic solution Place 1 drop into both eyes daily.   Magnesium 250 MG TABS Take 1 tablet by mouth daily.   rOPINIRole (REQUIP) 0.5 MG tablet Take 1 tablet (0.5 mg total) by mouth at bedtime.   rosuvastatin (CRESTOR) 20 MG tablet TAKE 1 TABLET BY MOUTH THREE TIMES A WEEK (MON WEDNESDAY AND FRIDAY)   vitamin B-12 (CYANOCOBALAMIN)  500 MCG tablet Take 500 mcg by mouth daily.   Vitamin D, Cholecalciferol, 25 MCG (1000 UT) TABS Take 1 tablet by mouth daily.    Past Medical History:  Diagnosis Date   Allergy    Arthritis    right knee   FH: colonic polyps    GERD (gastroesophageal reflux disease)    Hypercholesterolemia    Internal hemorrhoids    Obesity    Osteopenia    Venous aneurysm 11/2019   left antecubital   Walking pneumonia     Past Surgical History:  Procedure Laterality Date   ABDOMINAL HYSTERECTOMY     ANAL FISSURE REPAIR  1980's   APPENDECTOMY     BACK SURGERY  2000   lumbar fusion. SCREWS IN BACK   bone spur     BREAST BIOPSY Right 1980's   BREAST SURGERY     CERVICAL CONE BIOPSY     lumbar fission     MASS EXCISION Left 01/07/2020   Procedure: EXCISION MASS (RESECTION OF ANTECUBITAL VENOUS ANEURYSM);  Surgeon: Katha Cabal, MD;  Location: [facility] ORS;  Service: Vascular;  Laterality: Left;    Social History Social History   Tobacco Use   Smoking status: Never   Smokeless tobacco: Never  Vaping Use   Vaping Use: Never used  Substance Use Topics   Alcohol use: Not Currently    Alcohol/week: 0.0 standard drinks    Comment: on occasion   Drug use: No    Family  History Family History  Problem Relation Age of Onset   Diabetes Mother    Hyperlipidemia Mother    Hypertension Mother    Osteoporosis Mother    Cancer Father        lung   Diabetes Father    Diabetes Sister    Hyperlipidemia Sister    Hypertension Sister    Hypertension Brother    Hyperlipidemia Sister    Breast cancer Neg Hx     Allergies  Allergen Reactions   Adhesive [Tape] Rash    PAPER TAPE OKAY   Lipitor [Atorvastatin] Other (See Comments)    Muscle aches   Oxycodone Rash   Percodan [Oxycodone-Aspirin] Rash   Pravachol [Pravastatin Sodium] Other (See Comments)    Muscle aches    Propoxyphene Other (See Comments)    depression     REVIEW OF SYSTEMS (Negative unless  checked)  Constitutional: '[]'$ Weight loss  '[]'$ Fever  '[]'$ Chills Cardiac: '[]'$ Chest pain   '[]'$ Chest pressure   '[]'$ Palpitations   '[]'$ Shortness of breath when laying flat   '[]'$ Shortness of breath with exertion. Vascular:  '[]'$ Pain in legs with walking   '[x]'$ Pain in legs at rest  '[]'$ History of DVT   '[]'$ Phlebitis   '[x]'$ Swelling in legs   '[]'$ Varicose veins   '[]'$ Non-healing ulcers Pulmonary:   '[]'$ Uses home oxygen   '[]'$ Productive cough   '[]'$ Hemoptysis   '[]'$ Wheeze  '[]'$ COPD   '[]'$ Asthma Neurologic:  '[]'$ Dizziness   '[]'$ Seizures   '[]'$ History of stroke   '[]'$ History of TIA  '[]'$ Aphasia   '[]'$ Vissual changes   '[]'$ Weakness or numbness in arm   '[]'$ Weakness or numbness in leg Musculoskeletal:   '[]'$ Joint swelling   '[]'$ Joint pain   '[]'$ Low back pain Hematologic:  '[]'$ Easy bruising  '[]'$ Easy bleeding   '[]'$ Hypercoagulable state   '[]'$ Anemic Gastrointestinal:  '[]'$ Diarrhea   '[]'$ Vomiting  '[]'$ Gastroesophageal reflux/heartburn   '[]'$ Difficulty swallowing. Genitourinary:  '[]'$ Chronic kidney disease   '[]'$ Difficult urination  '[]'$ Frequent urination   '[]'$ Blood in urine Skin:  '[]'$ Rashes   '[]'$ Ulcers  Psychological:  '[]'$ History of anxiety   '[]'$  History of major depression.  Physical Examination  Vitals:   12/12/21 1056  BP: (!) 145/81  Pulse: 75  Resp: 16  Weight: 198 lb (89.8 kg)   Body mass index is 33.99 kg/m. Gen: WD/WN, NAD Head: [place]/AT, No temporalis wasting.  Ear/Nose/Throat: Hearing grossly intact, nares w/o erythema or drainage, pinna without lesions Eyes: PER, EOMI, sclera nonicteric.  Neck: Supple, no gross masses.  No JVD.  Pulmonary:  Good air movement, no audible wheezing, no use of accessory muscles.  Cardiac: RRR, precordium not hyperdynamic. Vascular:  scattered varicosities present bilaterally.  Moderate venous stasis changes to the legs bilaterally.  2+ soft pitting edema  Vessel Right Left  Radial Palpable Palpable  Gastrointestinal: soft, non-distended. No guarding/no peritoneal signs.  Musculoskeletal: M/S 5/5 throughout.  No deformity.  Neurologic: CN  2-12 intact. Pain and light touch intact in extremities.  Symmetrical.  Speech is fluent. Motor exam as listed above. Psychiatric: Judgment intact, Mood & affect appropriate for pt's clinical situation. Dermatologic: Venous rashes no ulcers noted.  No changes consistent with cellulitis. Lymph : No lichenification or skin changes of chronic lymphedema.  CBC Lab Results  Component Value Date   WBC 4.9 09/13/2021   HGB 12.4 09/13/2021   HCT 37.6 09/13/2021   MCV 89 09/13/2021   PLT 182 09/13/2021    BMET    Component Value Date/Time   NA 140 06/30/2021 0942   K 4.0 06/30/2021 0942   CL  102 06/30/2021 0942   CO2 25 06/30/2021 0942   GLUCOSE 89 06/30/2021 0942   GLUCOSE 90 01/05/2020 0818   BUN 14 06/30/2021 0942   CREATININE 0.66 06/30/2021 0942   CALCIUM 9.2 06/30/2021 0942   GFRNONAA 90 05/05/2020 0944   GFRAA 104 05/05/2020 0944   CrCl cannot be calculated (Patient's most recent lab result is older than the maximum 21 days allowed.).  COAG Lab Results  Component Value Date   INR 0.9 01/05/2020    Radiology No results found.   Assessment/Plan 1. Leg cramps Recommend:  The patient is describing Charley horse type leg cramps. No invasive studies, angiography or surgery at this time.  Magnesium supplementation at bedtime was recommended.    I have reviewed homeopathic remedies such as Cider vinegar or yellow mustard; placing a bar of soap at the bottom of the bed. Quinine (utilizing tonic water) is also an option.  The patient should continue walking and begin a more formal exercise program.  The patient should continue antiplatelet therapy and aggressive treatment of the lipid abnormalities  The patient should consider wearing graduated compression socks 15-20 mmHg strength to control any mild edema.  The patient will follow up with me on a PRN basis.    2. Pain in both lower extremities Recommend:  The patient is describing Charley horse type leg  cramps. No invasive studies, angiography or surgery at this time.  Magnesium supplementation at bedtime was recommended.    I have reviewed homeopathic remedies such as Cider vinegar or yellow mustard; placing a bar of soap at the bottom of the bed. Quinine (utilizing tonic water) is also an option.  The patient should continue walking and begin a more formal exercise program.  The patient should continue antiplatelet therapy and aggressive treatment of the lipid abnormalities  The patient should consider wearing graduated compression socks 15-20 mmHg strength to control any mild edema.  The patient will follow up with me on a PRN basis.    3. Mixed hyperlipidemia Continue statin as ordered and reviewed, no changes at this time     [name], MD  12/17/2021 3:54 PM

## 2021-12-20 NOTE — Telephone Encounter (Signed)
Requested Prescriptions  Pending Prescriptions Disp Refills  . rosuvastatin (CRESTOR) 20 MG tablet [Pharmacy Med Name: ROSUVASTATIN CALCIUM 20 MG TAB] 90 tablet 0    Sig: TAKE 1 TABLET BY MOUTH THREE TIMES A WEEK (MON WEDNESDAY AND FRIDAY)     Cardiovascular:  Antilipid - Statins 2 Failed - 12/17/2021  8:42 AM      Failed - Lipid Panel in normal range within the last 12 months    Cholesterol, Total  Date Value Ref Range Status  06/30/2021 166 100 - 199 mg/dL Final   LDL Chol Calc (NIH)  Date Value Ref Range Status  06/30/2021 97 0 - 99 mg/dL Final   HDL  Date Value Ref Range Status  06/30/2021 55 >39 mg/dL Final   Triglycerides  Date Value Ref Range Status  06/30/2021 74 0 - 149 mg/dL Final         Passed - Cr in normal range and within 360 days    Creatinine, Ser  Date Value Ref Range Status  06/30/2021 0.66 0.57 - 1.00 mg/dL Final         Passed - Patient is not pregnant      Passed - Valid encounter within last 12 months    Recent Outpatient Visits          1 month ago Leg cramps   [place], [place] T, NP   3 months ago Thrombocytopenia ([place])   [place] Cannady, [place] T, NP   5 months ago Thrombocytopenia ([place])   [place] Cannady, Henrine Screws T, NP   11 months ago Thrombocytopenia ([place])   [place], [place] T, NP   1 year ago Mixed hyperlipidemia   [place], Barbaraann Faster, NP      Future Appointments            In 3 weeks Cannady, Barbaraann Faster, NP [facility]   In 3 months  [facility]

## 2022-01-04 ENCOUNTER — Other Ambulatory Visit: Payer: Self-pay | Admitting: Nurse Practitioner

## 2022-01-04 NOTE — Telephone Encounter (Signed)
Requested Prescriptions  Pending Prescriptions Disp Refills  . famotidine (PEPCID) 20 MG tablet [Pharmacy Med Name: FAMOTIDINE 20 MG TAB] 90 tablet 0    Sig: TAKE 1 TABLET BY MOUTH ONCE DAILY     Gastroenterology:  H2 Antagonists Passed - 01/04/2022 10:53 AM      Passed - Valid encounter within last 12 months    Recent Outpatient Visits          1 month ago Leg cramps   [place], Henrine Screws T, NP   3 months ago Thrombocytopenia ([place])   [place] Cannady, Henrine Screws T, NP   6 months ago Thrombocytopenia ([place])   [place], Henrine Screws T, NP   1 year ago Thrombocytopenia ([place])   [place], Barbaraann Faster, NP   1 year ago Mixed hyperlipidemia   [place], Barbaraann Faster, NP      Future Appointments            In 1 week Cannady, Barbaraann Faster, NP [facility]   In 2 months  [facility]

## 2022-01-16 ENCOUNTER — Ambulatory Visit: Payer: Medicare PPO | Admitting: Nurse Practitioner

## 2022-01-16 ENCOUNTER — Encounter: Payer: Self-pay | Admitting: Nurse Practitioner

## 2022-01-16 VITALS — BP 112/74 | HR 62 | Temp 98.0°F | Ht 64.0 in | Wt 201.6 lb

## 2022-01-16 DIAGNOSIS — G2581 Restless legs syndrome: Secondary | ICD-10-CM | POA: Diagnosis not present

## 2022-01-16 DIAGNOSIS — Z6834 Body mass index (BMI) 34.0-34.9, adult: Secondary | ICD-10-CM

## 2022-01-16 DIAGNOSIS — R252 Cramp and spasm: Secondary | ICD-10-CM

## 2022-01-16 DIAGNOSIS — D696 Thrombocytopenia, unspecified: Secondary | ICD-10-CM

## 2022-01-16 DIAGNOSIS — E538 Deficiency of other specified B group vitamins: Secondary | ICD-10-CM

## 2022-01-16 DIAGNOSIS — I700 Atherosclerosis of aorta: Secondary | ICD-10-CM | POA: Diagnosis not present

## 2022-01-16 DIAGNOSIS — E6609 Other obesity due to excess calories: Secondary | ICD-10-CM | POA: Diagnosis not present

## 2022-01-16 MED ORDER — ROPINIROLE HCL 1 MG PO TABS: 1.0000 mg | ORAL_TABLET | Freq: Every day | ORAL | 4 refills | Status: DC

## 2022-01-16 NOTE — Assessment & Plan Note (Signed)
Noted on x-ray imaging 11/15/21.  Recommend she continue statin as ordered and take daily Baby ASA for prevention.

## 2022-01-17 LAB — CBC WITH DIFFERENTIAL/PLATELET
Basophils Absolute: 0.1 10*3/uL (ref 0.0–0.2)
Basos: 1 %
EOS (ABSOLUTE): 0.2 10*3/uL (ref 0.0–0.4)
Eos: 4 %
Hematocrit: 36.1 % (ref 34.0–46.6)
Hemoglobin: 11.5 g/dL (ref 11.1–15.9)
Immature Grans (Abs): 0 10*3/uL (ref 0.0–0.1)
Immature Granulocytes: 0 %
Lymphocytes Absolute: 1.3 10*3/uL (ref 0.7–3.1)
Lymphs: 30 %
MCH: 28.8 pg (ref 26.6–33.0)
MCHC: 31.9 g/dL (ref 31.5–35.7)
MCV: 90 fL (ref 79–97)
Monocytes Absolute: 0.4 10*3/uL (ref 0.1–0.9)
Monocytes: 10 %
Neutrophils Absolute: 2.3 10*3/uL (ref 1.4–7.0)
Neutrophils: 55 %
Platelets: 159 10*3/uL (ref 150–450)
RBC: 4 x10E6/uL (ref 3.77–5.28)
RDW: 12.3 % (ref 11.7–15.4)
WBC: 4.2 10*3/uL (ref 3.4–10.8)

## 2022-01-17 LAB — VITAMIN B12: Vitamin B-12: 787 pg/mL (ref 232–1245)

## 2022-01-25 ENCOUNTER — Other Ambulatory Visit: Payer: Self-pay | Admitting: Nurse Practitioner

## 2022-01-25 NOTE — Telephone Encounter (Signed)
Requested medication (s) are due for refill today: yes   Requested medication (s) are on the active medication list: yes   Last refill:  12/20/21 #90 0 refills   Future visit scheduled: yes in 1 month   Notes to clinic:  protocol failed. Last labs 06/30/21. Do you want to refill Rx?     Requested Prescriptions  Pending Prescriptions Disp Refills   rosuvastatin (CRESTOR) 20 MG tablet [Pharmacy Med Name: ROSUVASTATIN CALCIUM 20 MG TAB] 90 tablet 0    Sig: TAKE 1 TABLET BY MOUTH THREE TIMES A WEEK (MON WEDNESDAY AND FRIDAY)     Cardiovascular:  Antilipid - Statins 2 Failed - 01/25/2022  9:48 AM      Failed - Lipid Panel in normal range within the last 12 months    Cholesterol, Total  Date Value Ref Range Status  06/30/2021 166 100 - 199 mg/dL Final   LDL Chol Calc (NIH)  Date Value Ref Range Status  06/30/2021 97 0 - 99 mg/dL Final   HDL  Date Value Ref Range Status  06/30/2021 55 >39 mg/dL Final   Triglycerides  Date Value Ref Range Status  06/30/2021 74 0 - 149 mg/dL Final         Passed - Cr in normal range and within 360 days    Creatinine, Ser  Date Value Ref Range Status  06/30/2021 0.66 0.57 - 1.00 mg/dL Final         Passed - Patient is not pregnant      Passed - Valid encounter within last 12 months    Recent Outpatient Visits           1 week ago Aortic atherosclerosis ([place])   [place], Henrine Screws T, NP   2 months ago Leg cramps   [place], [place] T, NP   4 months ago Thrombocytopenia ([place])   [place] Cannady, [place] T, NP   6 months ago Thrombocytopenia ([place])   [place], Henrine Screws T, NP   1 year ago Thrombocytopenia ([place])   [place], Barbaraann Faster, NP       Future Appointments             In 1 month Cannady, Barbaraann Faster, NP [facility]   In 1 month  [facility]

## 2022-03-11 NOTE — Patient Instructions (Incomplete)
Try Slow Fe or Vitron = take one tablet daily.  Restless Legs Syndrome Restless legs syndrome is a condition that causes uncomfortable feelings or sensations in the legs, especially while sitting or lying down. The sensations usually cause an overwhelming urge to move the legs. The arms can also sometimes be affected. The condition can range from mild to severe. The symptoms often interfere with a person's ability to sleep. What are the causes? The cause of this condition is not known. What increases the risk? The following factors may make you more likely to develop this condition: Being older than 50. Pregnancy. Being a woman. In general, the condition is more common in women than in men. A family history of the condition. Having iron deficiency. Overuse of caffeine, nicotine, or alcohol. Certain medical conditions, such as kidney disease, Parkinson's disease, or nerve damage. Certain medicines, such as those for high blood pressure, nausea, colds, allergies, depression, and some heart conditions. What are the signs or symptoms? The main symptom of this condition is uncomfortable sensations in the legs, such as: Pulling. Tingling. Prickling. Throbbing. Crawling. Burning. Usually, the sensations: Affect both sides of the body. Are worse when you sit or lie down. Are worse at night. These may make it difficult to fall asleep. Make you have a strong urge to move your legs. Are temporarily relieved by moving your legs or standing. The arms can also be affected, but this is rare. People who have this condition often have tiredness during the day because of their lack of sleep at night. How is this diagnosed? This condition may be diagnosed based on: Your symptoms. Blood tests. In some cases, you may be monitored in a sleep lab by a specialist (a sleep study). This can detect any disruptions in your sleep. How is this treated? This condition is treated by managing the symptoms. This  may include: Lifestyle changes, such as exercising, using relaxation techniques, and avoiding caffeine, alcohol, or tobacco. Iron supplements. Medicines. Parkinson's medications may be tried first. Anti-seizure medications can also be helpful. Follow these instructions at home: General instructions Take over-the-counter and prescription medicines only as told by your health care provider. Use methods to help relieve the uncomfortable sensations, such as: Massaging your legs. Walking or stretching. Taking a cold or hot bath. Keep all follow-up visits. This is important. Lifestyle     Practice good sleep habits. For example, go to bed and get up at the same time every day. Most adults should get 7-9 hours of sleep each night. Exercise regularly. Try to get at least 30 minutes of exercise most days of the week. Practice ways of relaxing, such as yoga or meditation. Avoid caffeine and alcohol. Do not use any products that contain nicotine or tobacco. These products include cigarettes, chewing tobacco, and vaping devices, such as e-cigarettes. If you need help quitting, ask your health care provider. Where to find more information [employer] of Neurological Disorders and Stroke: [employer] Contact a health care provider if: Your symptoms get worse or they do not improve with treatment. Summary Restless legs syndrome is a condition that causes uncomfortable feelings or sensations in the legs, especially while sitting or lying down. The symptoms often interfere with your ability to sleep. This condition is treated by managing the symptoms. You may need to make lifestyle changes or take medicines. This information is not intended to replace advice given to you by your health care provider. Make sure you discuss any questions you have with your health  care provider. Document Revised: 02/20/2021 Document Reviewed: 02/20/2021 Elsevier Patient Education  [place].

## 2022-03-14 ENCOUNTER — Ambulatory Visit: Payer: Medicare PPO | Admitting: Nurse Practitioner

## 2022-03-14 ENCOUNTER — Encounter: Payer: Self-pay | Admitting: Nurse Practitioner

## 2022-03-14 VITALS — BP 105/67 | HR 72 | Temp 97.9°F | Ht 64.0 in | Wt 196.4 lb

## 2022-03-14 DIAGNOSIS — F4323 Adjustment disorder with mixed anxiety and depressed mood: Secondary | ICD-10-CM

## 2022-03-14 DIAGNOSIS — G8929 Other chronic pain: Secondary | ICD-10-CM | POA: Diagnosis not present

## 2022-03-14 DIAGNOSIS — M25562 Pain in left knee: Secondary | ICD-10-CM

## 2022-03-14 DIAGNOSIS — G2581 Restless legs syndrome: Secondary | ICD-10-CM

## 2022-03-14 MED ORDER — GABAPENTIN 100 MG PO CAPS: 100.0000 mg | ORAL_CAPSULE | Freq: Every day | ORAL | 4 refills | Status: DC

## 2022-03-14 NOTE — Assessment & Plan Note (Signed)
Chronic, ongoing.   - Stop Requip as offering no benefit.  Start Gabapentin 100 MG QHS and if tolerates may increase to 200 MG QHS in one week.  Educated her on this medication and use + side effects to monitor.   - Continue Magnesium and B12 supplements + ferritin was on lower side -- recommend Vitron or Slow Fe one tablet daily (regular iron caused constipation).   - She is trying mustard and tonic water, as recommended by vascular, not seeing much benefit.   - Could consider period of physical therapy or massage therapy if ongoing.  Recommend she wear compression hose on during day and off at night.

## 2022-03-14 NOTE — Assessment & Plan Note (Signed)
Ongoing and worsening.  Suspect significant OA present.  Referral to ortho to further assess.  Discussed at length with patient.  Continue current treatment at home.

## 2022-03-14 NOTE — Assessment & Plan Note (Signed)
Ongoing, she is planning on leaving her husband and is prepared for this.  Does not wish to start medication at this time.  Highly recommend she return to therapy via [facility] and offered empathetic listening today.

## 2022-03-14 NOTE — Progress Notes (Signed)
BP 105/67   Pulse 72   Temp 97.9 F (36.6 C) (Oral)   Ht '5\' 4"'$  (1.626 m)   Wt 196 lb 6.4 oz (89.1 kg)   LMP  (LMP Unknown)   SpO2 99%   BMI 33.71 kg/m    Subjective:    Patient ID: Patricia Ferguson, female    DOB: [birth year], 71 y.o.   MRN: [id number]  HPI: Patricia Ferguson is a 71 y.o. female  Chief Complaint  Patient presents with   RLS    Patient is here for Restless Leg Syndrome. Patient says she is still having issues with her legs at night.    Knee Pain    Patient says she noticed on Sunday, she is having issues with her left knee. Patient says it feels as if her knee is popping out of joint and feels as if it will give out.    RESTLESS LEGS Has been seen by vascular, last 12/12/21, who suspects this is not vascular related.   Lumbar imaging on 11/14/21 did note intact fusion hardware + aortic atherosclerosis. Was noted to be diffuse osteopenia and degenerative changes present. Is taking B12 supplement which is improving levels.  She does donate plasma, once every week or two weeks.  We increased Requip last visit to 1 MG at night and continued Magnesium and B12.  She is not noticing any difference with increase in Requip.  She is not taking iron supplement at this time. Duration: chronic Discomfort description:  ill-defined Pain: no Location: lower legs Bilateral: yes Symmetric: yes Onset:  gradual Frequency:  intermittent Symptoms only occur while legs at rest: yes Sudden unintentional leg jerking: yes Bed partner bothered by leg movements: no LE numbness: no Decreased sensation: no Weakness: no Insomnia: yes Daytime somnolence: no Fatigue: yes Alleviating factors: nothing Aggravating factors: unknown Status: fluctuating Treatments attempted:  Requip, B12, Magnesium, mustard  KNEE PAIN Left pain -- started a couple years ago, wore brace and used TENS unit which improved it.  Then this Sunday knee pain returned.  Duration: chronic Involved knee:  left Mechanism of injury: unknown Location:diffuse Onset: gradual Severity: 7/10  Quality:  sharp, dull, aching, and throbbing Frequency: intermittent Radiation: no Aggravating factors: weight bearing, walking, stairs, bending, and movement  Alleviating factors: wearing brace  Status: worsening Treatments attempted: brace  Relief with NSAIDs?:  No NSAIDs Taken Weakness with weight bearing or walking: yes Sensation of giving way: yes Locking: no Popping: no Bruising: no Swelling: yes Redness: no Paresthesias/decreased sensation: no Fevers: no   DEPRESSION Husband is alcoholic and recently was in rehab, but when returns goes back to drinking.  Last night she left and stayed with sister.  She was taking him to doctor every 3 weeks, she kept journal. Mood status: stable Satisfied with current treatment?: yes Psychotherapy/counseling: not present Depressed mood: yes Anxious mood: no Anhedonia: no Significant weight loss or gain: no Insomnia: yes hard to fall asleep Fatigue: yes Feelings of worthlessness or guilt: no Impaired concentration/indecisiveness: no Suicidal ideations: no Hopelessness: yes Crying spells: no    03/14/2022    8:42 AM 11/11/2021    8:07 AM 09/13/2021    3:24 PM 09/13/2021    3:23 PM 06/30/2021    9:11 AM  Depression screen PHQ 2/9  Decreased Interest 2 1 0 0 2  Down, Depressed, Hopeless 1 1 0 0 2  PHQ - 2 Score 3 2 0 0 4  Altered sleeping '2 1 1  3  '$ Tired,  decreased energy 0 0 0  1  Change in appetite 2 1 0  1  Feeling bad or failure about yourself  2 1 0  2  Trouble concentrating 0 0 0  1  Moving slowly or fidgety/restless 0 0 0  0  Suicidal thoughts 0 1 0  1  PHQ-9 Score '9 6 1  13  '$ Difficult doing work/chores Not difficult at all    Somewhat difficult       03/14/2022    8:42 AM 11/11/2021    8:07 AM 09/13/2021    3:32 PM 06/30/2021    9:11 AM  GAD 7 : Generalized Anxiety Score  Nervous, Anxious, on Edge 2 1 0 2  Control/stop worrying 1 1 0  2  Worry too much - different things 1  0 2  Trouble relaxing 2 0 0 2  Restless 1 0 0 0  Easily annoyed or irritable 0 0 0 1  Afraid - awful might happen 0 1 0 0  Total GAD 7 Score 7  0 9  Anxiety Difficulty Not difficult at all Somewhat difficult  Somewhat difficult    Relevant past medical, surgical, family and social history reviewed and updated as indicated. Interim medical history since our last visit reviewed. Allergies and medications reviewed and updated.  Review of Systems  Constitutional:  Negative for activity change, appetite change, diaphoresis, fatigue and fever.  Respiratory:  Negative for cough, chest tightness and shortness of breath.   Cardiovascular:  Negative for chest pain, palpitations and leg swelling.  Musculoskeletal:  Positive for arthralgias.  Neurological: Negative.   Psychiatric/Behavioral: Negative.      Per HPI unless specifically indicated above     Objective:    BP 105/67   Pulse 72   Temp 97.9 F (36.6 C) (Oral)   Ht '5\' 4"'$  (1.626 m)   Wt 196 lb 6.4 oz (89.1 kg)   LMP  (LMP Unknown)   SpO2 99%   BMI 33.71 kg/m   Wt Readings from Last 3 Encounters:  03/14/22 196 lb 6.4 oz (89.1 kg)  01/16/22 201 lb 9.6 oz (91.4 kg)  12/12/21 198 lb (89.8 kg)    Physical Exam Vitals and nursing note reviewed.  Constitutional:      General: She is awake.     Appearance: Normal appearance. She is well-developed and well-groomed. She is not ill-appearing.  HENT:     Head: Normocephalic.     Right Ear: Hearing normal.     Left Ear: Hearing normal.     Nose: Nose normal.     Mouth/Throat:     Mouth: Mucous membranes are moist.  Eyes:     General: Lids are normal. No scleral icterus.       Right eye: No discharge.        Left eye: No discharge.     Conjunctiva/sclera: Conjunctivae normal.     Pupils: Pupils are equal, round, and reactive to light.  Neck:     Thyroid: No thyromegaly.     Vascular: No carotid bruit or JVD.  Cardiovascular:      Rate and Rhythm: Normal rate and regular rhythm.     Pulses: Normal pulses.          Dorsalis pedis pulses are 2+ on the right side and 2+ on the left side.       Posterior tibial pulses are 2+ on the right side and 2+ on the left side.     Heart sounds:  Normal heart sounds. No murmur heard.    No gallop.     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 Pulmonary:     Effort: Pulmonary effort is normal. No accessory muscle usage or respiratory distress.     Breath sounds: Normal breath sounds. No wheezing or rhonchi.  Abdominal:     General: Bowel sounds are normal.     Palpations: Abdomen is soft. There is no hepatomegaly or splenomegaly.  Musculoskeletal:        General: No swelling or signs of injury.     Cervical back: Full passive range of motion without pain, normal range of motion and neck supple.     Right knee: Normal.     Left knee: Swelling and crepitus (significant with popping) present. No erythema, ecchymosis or bony tenderness. Decreased range of motion. Tenderness present over the medial joint line and lateral joint line. Normal pulse.     Instability Tests: Anterior drawer test negative. Posterior drawer test negative. Medial McMurray test negative and lateral McMurray test negative.     Right lower leg: No edema.     Left lower leg: No edema.  Lymphadenopathy:     Cervical: No cervical adenopathy.  Skin:    General: Skin is warm and dry.     Capillary Refill: Capillary refill takes less than 2 seconds.  Neurological:     Mental Status: She is alert and oriented to person, place, and time.     Motor: No weakness.  Psychiatric:        Attention and Perception: Attention normal.        Mood and Affect: Mood normal. Mood is not anxious or depressed.        Speech: Speech normal.        Behavior: Behavior normal. Behavior is cooperative.        Thought Content: Thought content normal.        Judgment: Judgment normal.    Results for orders placed or performed in  visit on 01/16/22  CBC with Differential/Platelet  Result Value Ref Range   WBC 4.2 3.4 - 10.8 x10E3/uL   RBC 4.00 3.77 - 5.28 x10E6/uL   Hemoglobin 11.5 11.1 - 15.9 g/dL   Hematocrit 36.1 34.0 - 46.6 %   MCV 90 79 - 97 fL   MCH 28.8 26.6 - 33.0 pg   MCHC 31.9 31.5 - 35.7 g/dL   RDW 12.3 11.7 - 15.4 %   Platelets 159 150 - 450 x10E3/uL   Neutrophils 55 Not Estab. %   Lymphs 30 Not Estab. %   Monocytes 10 Not Estab. %   Eos 4 Not Estab. %   Basos 1 Not Estab. %   Neutrophils Absolute 2.3 1.4 - 7.0 x10E3/uL   Lymphocytes Absolute 1.3 0.7 - 3.1 x10E3/uL   Monocytes Absolute 0.4 0.1 - 0.9 x10E3/uL   EOS (ABSOLUTE) 0.2 0.0 - 0.4 x10E3/uL   Basophils Absolute 0.1 0.0 - 0.2 x10E3/uL   Immature Granulocytes 0 Not Estab. %   Immature Grans (Abs) 0.0 0.0 - 0.1 x10E3/uL  Vitamin B12  Result Value Ref Range   Vitamin B-12 787 232 - 1,245 pg/mL      Assessment & Plan:   Problem List Items Addressed This Visit       Other   Chronic pain of left knee    Ongoing and worsening.  Suspect significant OA present.  Referral to ortho to further assess.  Discussed at length with patient.  Continue current treatment at  home.      Relevant Medications   gabapentin (NEURONTIN) 100 MG capsule   Other Relevant Orders   Ambulatory referral to Orthopedics   Restless leg syndrome - Primary    Chronic, ongoing.   - Stop Requip as offering no benefit.  Start Gabapentin 100 MG QHS and if tolerates may increase to 200 MG QHS in one week.  Educated her on this medication and use + side effects to monitor.   - Continue Magnesium and B12 supplements + ferritin was on lower side -- recommend Vitron or Slow Fe one tablet daily (regular iron caused constipation).   - She is trying mustard and tonic water, as recommended by vascular, not seeing much benefit.   - Could consider period of physical therapy or massage therapy if ongoing.  Recommend she wear compression hose on during day and off at night.       Situational mixed anxiety and depressive disorder    Ongoing, she is planning on leaving her husband and is prepared for this.  Does not wish to start medication at this time.  Highly recommend she return to therapy via [facility] and offered empathetic listening today.        Follow up plan: Return in about 8 weeks (around 05/08/2022) for Annual physical.

## 2022-03-20 ENCOUNTER — Telehealth: Payer: Self-pay

## 2022-03-20 ENCOUNTER — Other Ambulatory Visit: Payer: Self-pay

## 2022-03-20 ENCOUNTER — Ambulatory Visit (INDEPENDENT_AMBULATORY_CARE_PROVIDER_SITE_OTHER): Payer: Medicare PPO | Admitting: *Deleted

## 2022-03-20 DIAGNOSIS — Z1211 Encounter for screening for malignant neoplasm of colon: Secondary | ICD-10-CM

## 2022-03-20 DIAGNOSIS — Z780 Asymptomatic menopausal state: Secondary | ICD-10-CM | POA: Diagnosis not present

## 2022-03-20 DIAGNOSIS — Z0000 Encounter for general adult medical examination without abnormal findings: Secondary | ICD-10-CM

## 2022-03-20 MED ORDER — PEG 3350-KCL-NA BICARB-NACL 420 G PO SOLR: 4000.0000 mL | Freq: Once | ORAL | 0 refills | Status: AC

## 2022-03-20 NOTE — Telephone Encounter (Signed)
Gastroenterology Pre-Procedure Review  Request Date: 04/28/22 Requesting Physician: Dr. Vicente Males  PATIENT REVIEW QUESTIONS: The patient responded to the following health history questions as indicated:    1. Are you having any GI issues? no 2. Do you have a personal history of Polyps? yes (pt stated maybe 4 years ago but did not see a record of this in chart) 3. Do you have a family history of Colon Cancer or Polyps? yes (colon polyps brother) 4. Diabetes Mellitus? no 5. Joint replacements in the past 12 months?no 6. Major health problems in the past 3 months?no 7. Any artificial heart valves, MVP, or defibrillator?no    MEDICATIONS & ALLERGIES:    Patient reports the following regarding taking any anticoagulation/antiplatelet therapy:   Plavix, Coumadin, Eliquis, Xarelto, Lovenox, Pradaxa, Brilinta, or Effient? no Aspirin? no  Patient confirms/reports the following medications:  Current Outpatient Medications  Medication Sig Dispense Refill   ezetimibe (ZETIA) 10 MG tablet TAKE 1 TABLET BY MOUTH ONCE DAILY 90 tablet 0   famotidine (PEPCID) 20 MG tablet TAKE 1 TABLET BY MOUTH ONCE DAILY 90 tablet 0   gabapentin (NEURONTIN) 100 MG capsule Take 1 capsule (100 mg total) by mouth at bedtime. If tolerating in one week and not at goal for restless leg symptoms, may increase to 200 MG (2 tablets) at night. 60 capsule 4   hydroxypropyl methylcellulose / hypromellose (ISOPTO TEARS / GONIOVISC) 2.5 % ophthalmic solution Place 1 drop into both eyes daily.     Magnesium 250 MG TABS Take 1 tablet by mouth daily.     rOPINIRole (REQUIP) 0.5 MG tablet Take 0.5 mg by mouth at bedtime.     rosuvastatin (CRESTOR) 20 MG tablet TAKE 1 TABLET BY MOUTH THREE TIMES A WEEK (MON WEDNESDAY AND FRIDAY) 36 tablet 4   vitamin B-12 (CYANOCOBALAMIN) 500 MCG tablet Take 500 mcg by mouth daily.     Vitamin D, Cholecalciferol, 25 MCG (1000 UT) TABS Take 1 tablet by mouth daily.     No current facility-administered  medications for this visit.    Patient confirms/reports the following allergies:  Allergies  Allergen Reactions   Adhesive [Tape] Rash    PAPER TAPE OKAY   Lipitor [Atorvastatin] Other (See Comments)    Muscle aches   Oxycodone Rash   Percodan [Oxycodone-Aspirin] Rash   Pravachol [Pravastatin Sodium] Other (See Comments)    Muscle aches    Propoxyphene Other (See Comments)    depression    No orders of the defined types were placed in this encounter.   AUTHORIZATION INFORMATION Primary Insurance: 1D#: Group #:  Secondary Insurance: 1D#: Group #:  SCHEDULE INFORMATION: Date: 04/28/22 Time: Location: [facility]

## 2022-03-20 NOTE — Progress Notes (Signed)
Subjective:   Patricia Ferguson is a 71 y.o. female who presents for Medicare Annual (Subsequent) preventive examination.  I connected with  Patricia Ferguson on 03/20/22 by a telephone enabled telemedicine application and verified that I am speaking with the correct person using two identifiers.   I discussed the limitations of evaluation and management by telemedicine. The patient expressed understanding and agreed to proceed.  Patient location: home  Provider location: Tele-Health-home    Review of Systems     Cardiac Risk Factors include: advanced age (>34mn, >>20women);obesity (BMI >30kg/m2) (patient does work part time in a school)     Objective:    Today's Vitals   There is no height or weight on file to calculate BMI.     03/20/2022   11:05 AM 10/03/2021    2:50 PM 03/18/2021   11:17 AM 03/15/2020    9:01 AM 01/07/2020   11:50 AM 12/31/2019   10:10 AM 03/05/2019    8:00 AM  Advanced Directives  Does Patient Have a Medical Advance Directive? No No No No No No No  Would patient like information on creating a medical advance directive? No - Patient declined No - Patient declined   No - Patient declined Yes (MAU/Ambulatory/Procedural Areas - Information given)     Current Medications (verified) Outpatient Encounter Medications as of 03/20/2022  Medication Sig   ezetimibe (ZETIA) 10 MG tablet TAKE 1 TABLET BY MOUTH ONCE DAILY   famotidine (PEPCID) 20 MG tablet TAKE 1 TABLET BY MOUTH ONCE DAILY   gabapentin (NEURONTIN) 100 MG capsule Take 1 capsule (100 mg total) by mouth at bedtime. If tolerating in one week and not at goal for restless leg symptoms, may increase to 200 MG (2 tablets) at night.   hydroxypropyl methylcellulose / hypromellose (ISOPTO TEARS / GONIOVISC) 2.5 % ophthalmic solution Place 1 drop into both eyes daily.   Magnesium 250 MG TABS Take 1 tablet by mouth daily.   rOPINIRole (REQUIP) 0.5 MG tablet Take 0.5 mg by mouth at bedtime.   rosuvastatin (CRESTOR) 20  MG tablet TAKE 1 TABLET BY MOUTH THREE TIMES A WEEK (MON WEDNESDAY AND FRIDAY)   vitamin B-12 (CYANOCOBALAMIN) 500 MCG tablet Take 500 mcg by mouth daily.   Vitamin D, Cholecalciferol, 25 MCG (1000 UT) TABS Take 1 tablet by mouth daily.   No facility-administered encounter medications on file as of 03/20/2022.    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 SCREWS IN BACK   bone spur     BREAST BIOPSY Right 1980's   BREAST SURGERY     CERVICAL CONE BIOPSY     lumbar fission     MASS EXCISION Left 01/07/2020   Procedure: EXCISION MASS (RESECTION OF ANTECUBITAL VENOUS ANEURYSM);  Surgeon: SKatha Cabal MD;  Location: [facility] ORS;  Service: Vascular;  Laterality: Left;   Family  History  Problem Relation Age of Onset   Diabetes Mother    Hyperlipidemia Mother    Hypertension Mother    Osteoporosis Mother    Cancer Father        lung   Diabetes Father    Diabetes Sister    Hyperlipidemia Sister    Hypertension Sister    Hypertension Brother    Hyperlipidemia Sister    Breast cancer Neg Hx    Social History   Socioeconomic History   Marital status: Married    Spouse name: Jeneen Rinks   Number of children: Not on file   Years of education: some college    Highest education level: High school graduate  Occupational History   Occupation: Lobbyist part time     Comment: Administrator  Tobacco Use    Smoking status: Never   Smokeless tobacco: Never  Vaping Use   Vaping Use: Never used  Substance and Sexual Activity   Alcohol use: Not Currently    Alcohol/week: 0.0 standard drinks of alcohol    Comment: on occasion   Drug use: No   Sexual activity: Not Currently  Other Topics Concern   Not on file  Social History Narrative   Working part time .    Lives with husband.   Social Determinants of Health   Financial Resource Strain: Low Risk  (03/20/2022)   Overall Financial Resource Strain (CARDIA)    Difficulty of Paying Living Expenses: Not hard at all  Food Insecurity: No Food Insecurity (03/20/2022)   Hunger Vital Sign    Worried About Running Out of Food in the Last Year: Never true    Ran Out of Food in the Last Year: Never true  Transportation Needs: No Transportation Needs (03/20/2022)   PRAPARE - Hydrologist (Medical): No    Lack of Transportation (Non-Medical): No  Physical Activity: Inactive (03/20/2022)   Exercise Vital Sign    Days of Exercise per Week: 0 days    Minutes of Exercise per Session: 0 min  Stress: Stress Concern Present (03/20/2022)   [place]    Feeling of Stress : To some extent  Social Connections: Moderately Isolated (03/20/2022)   Social Connection and Isolation Panel [NHANES]    Frequency of Communication with Friends and Family: More than three times a week    Frequency of Social Gatherings with Friends and Family: More than three times a week    Attends Religious Services: Never    Marine scientist or Organizations: No    Attends Music therapist: Never    Marital Status: Married    Tobacco Counseling Counseling given: Not Answered   Clinical Intake:  Pre-visit preparation completed: Yes  Pain : No/denies pain     Nutritional Risks: None Diabetes: No  How often do you need to have someone help you when you read  instructions, pamphlets, or other written materials from your doctor or pharmacy?: 1 - Never  Diabetic?  no  Interpreter Needed?: No  Information entered by :: Leroy Kennedy LPN   Activities of Daily Living    03/20/2022   11:13 AM  In your present state of health, do you have any difficulty performing the following activities:  Hearing? 0  Vision? 0  Difficulty concentrating or making decisions? 0  Walking or climbing stairs? 0  Dressing or bathing? 0  Doing errands, shopping? 0  Preparing Food and eating ?  N  Using the Toilet? N  In the past six months, have you accidently leaked urine? N  Do you have problems with loss of bowel control? N  Managing your Medications? N  Managing your Finances? N  Housekeeping or managing your Housekeeping? N    Patient Care Team: Venita Lick, NP as PCP - General (Nurse Practitioner)  Indicate any recent Medical Services you may have received from other than [facility] providers in the past year (date may be approximate).     Assessment:   This is a routine wellness examination for Aliviya.  Hearing/Vision screen Hearing Screening - Comments:: No trouble hearing Vision Screening - Comments:: Up to date [facility]  Dietary issues and exercise activities discussed: Current Exercise Habits: The patient does not participate in regular exercise at present, Exercise limited by: None identified   Goals Addressed             This Visit's Progress    Weight (lb) < 200 lb (90.7 kg)       10 lbs       Depression Screen    03/20/2022   11:11 AM 03/14/2022    8:42 AM 11/11/2021    8:07 AM 09/13/2021    3:24 PM 09/13/2021    3:23 PM 06/30/2021    9:11 AM 03/18/2021   11:18 AM  PHQ 2/9 Scores  PHQ - 2 Score '2 3 2 '$ 0 0 4 0  PHQ- 9 Score '4 9 6 1  13     '$ Fall Risk    03/20/2022   11:06 AM 03/14/2022    8:41 AM 11/11/2021    8:07 AM 03/18/2021   11:18 AM 06/30/2020   10:57 AM  Fall Risk   Falls in the past year? 0 0 0 0 0  Number  falls in past yr: 0 0 0  0  Injury with Fall? 0 0 0  0  Risk for fall due to :  No Fall Risks No Fall Risks No Fall Risks   Follow up Falls evaluation completed;Education provided;Falls prevention discussed Falls evaluation completed Falls evaluation completed Falls evaluation completed;Education provided;Falls prevention discussed Falls evaluation completed    FALL RISK PREVENTION PERTAINING TO THE HOME:  Any stairs in or around the home? Yes  If so, are there any without handrails? No  Home free of loose throw rugs in walkways, pet beds, electrical cords, etc? Yes  Adequate lighting in your home to reduce risk of falls? Yes   ASSISTIVE DEVICES UTILIZED TO PREVENT FALLS:  Life alert? No  Use of a cane, walker or w/c? No  Grab bars in the bathroom? Yes  Shower chair or bench in shower? No  Elevated toilet seat or a handicapped toilet? No   TIMED UP AND GO:  Was the test performed? No .    Cognitive Function:        03/20/2022   11:07 AM 03/18/2021   11:19 AM 03/15/2020    9:04 AM 02/25/2018    8:26 AM  6CIT Screen  What Year? 0 points 0 points 0 points 0 points  What month? 0 points 0 points 0 points 0 points  What time? 0 points 0 points 0 points 0 points  Count back from 20 0 points 0 points 0 points 0 points  Months in reverse 0 points 0 points 0 points 0 points  Repeat phrase 0 points 2 points 0 points 0 points  Total Score 0 points 2 points 0 points  0 points    Immunizations Immunization History  Administered Date(s) Administered   Influenza, High Dose Seasonal PF 05/02/2019, 04/15/2020   Influenza-Unspecified 04/21/2016, 04/16/2017, 04/07/2018, 04/12/2021   PFIZER(Purple Top)SARS-COV-2 Vaccination 09/08/2019, 09/29/2019, 04/24/2020, 10/25/2020   Pneumococcal Conjugate-13 08/28/2017   Pneumococcal Polysaccharide-23 03/05/2019   Td 07/24/2004, 09/17/2009   Tdap 04/14/2020   Zoster Recombinat (Shingrix) 05/02/2019, 07/29/2019   Zoster, Live 10/25/2012    TDAP  status: Up to date  Flu Vaccine status: Up to date  Pneumococcal vaccine status: Up to date  Covid-19 vaccine status: Information provided on how to obtain vaccines.   Qualifies for Shingles Vaccine? Yes   Zostavax completed Yes   Shingrix Completed?: Yes  Screening Tests Health Maintenance  Topic Date Due   COVID-19 Vaccine (5 - Pfizer series) 12/20/2020   INFLUENZA VACCINE  02/21/2022   COLONOSCOPY (Pts 45-67yr Insurance coverage will need to be confirmed)  04/11/2022   DEXA SCAN  07/12/2023   MAMMOGRAM  08/09/2023   TETANUS/TDAP  04/14/2030   Pneumonia Vaccine 70 Years old  Completed   Hepatitis C Screening  Completed   Zoster Vaccines- Shingrix  Completed   HPV VACCINES  Aged Out    Health Maintenance  Health Maintenance Due  Topic Date Due   COVID-19 Vaccine (5 - Pfizer series) 12/20/2020   INFLUENZA VACCINE  02/21/2022    Colorectal cancer screening: Referral to GI placed  . Pt aware the office will call re: appt.  Mammogram status: Completed  . Repeat every year  Bone Density status: Ordered  . Pt provided with contact info and advised to call to schedule appt.  Lung Cancer Screening: (Low Dose CT Chest recommended if Age 70-80years, 30 pack-year currently smoking OR have quit w/in 15years.) does not qualify.   Lung Cancer Screening Referral:   Additional Screening:  Hepatitis C Screening: does not qualify; Completed 2017  Vision Screening: Recommended annual ophthalmology exams for early detection of glaucoma and other disorders of the eye. Is the patient up to date with their annual eye exam?  Yes  Who is the provider or what is the name of the office in which the patient attends annual eye exams? [facility] If pt is not established with a provider, would they like to be referred to a provider to establish care? No .   Dental Screening: Recommended annual dental exams for proper oral hygiene  Community Resource Referral / Chronic Care  Management: CRR required this visit?  No   CCM required this visit?  No      Plan:     I have personally reviewed and noted the following in the patient's chart:   Medical and social history Use of alcohol, tobacco or illicit drugs  Current medications and supplements including opioid prescriptions. Patient is not currently taking opioid prescriptions. Functional ability and status Nutritional status Physical activity Advanced directives List of other physicians Hospitalizations, surgeries, and ER visits in previous 12 months Vitals Screenings to include cognitive, depression, and falls Referrals and appointments  In addition, I have reviewed and discussed with patient certain preventive protocols, quality metrics, and best practice recommendations. A written personalized care plan for preventive services as well as general preventive health recommendations were provided to patient.     JLeroy Kennedy LPN   [id number]  Nurse Notes: patient stated she has support at home and a safe place to go.  Her husband is in rehab right now and she feels safe.  Never physically abusive, but  is verbally.  Patient is very frustrated with the situation .

## 2022-03-21 DIAGNOSIS — M1712 Unilateral primary osteoarthritis, left knee: Secondary | ICD-10-CM | POA: Diagnosis not present

## 2022-03-22 ENCOUNTER — Encounter: Payer: Self-pay | Admitting: Nurse Practitioner

## 2022-03-27 DIAGNOSIS — M25511 Pain in right shoulder: Secondary | ICD-10-CM | POA: Diagnosis not present

## 2022-03-27 DIAGNOSIS — M19011 Primary osteoarthritis, right shoulder: Secondary | ICD-10-CM | POA: Diagnosis not present

## 2022-03-27 DIAGNOSIS — S46011A Strain of muscle(s) and tendon(s) of the rotator cuff of right shoulder, initial encounter: Secondary | ICD-10-CM | POA: Diagnosis not present

## 2022-03-29 ENCOUNTER — Encounter: Payer: Self-pay | Admitting: Nurse Practitioner

## 2022-03-31 ENCOUNTER — Other Ambulatory Visit: Payer: Self-pay | Admitting: Nurse Practitioner

## 2022-04-03 NOTE — Telephone Encounter (Signed)
Requested Prescriptions  Pending Prescriptions Disp Refills  . ezetimibe (ZETIA) 10 MG tablet [Pharmacy Med Name: EZETIMIBE 10 MG TAB] 90 tablet 0    Sig: TAKE 1 TABLET BY MOUTH ONCE DAILY     Cardiovascular:  Antilipid - Sterol Transport Inhibitors Failed - 03/31/2022 11:38 AM      Failed - Lipid Panel in normal range within the last 12 months    Cholesterol, Total  Date Value Ref Range Status  06/30/2021 166 100 - 199 mg/dL Final   LDL Chol Calc (NIH)  Date Value Ref Range Status  06/30/2021 97 0 - 99 mg/dL Final   HDL  Date Value Ref Range Status  06/30/2021 55 >39 mg/dL Final   Triglycerides  Date Value Ref Range Status  06/30/2021 74 0 - 149 mg/dL Final         Passed - AST in normal range and within 360 days    AST  Date Value Ref Range Status  06/30/2021 28 0 - 40 IU/L Final         Passed - ALT in normal range and within 360 days    ALT  Date Value Ref Range Status  06/30/2021 20 0 - 32 IU/L Final         Passed - Patient is not pregnant      Passed - Valid encounter within last 12 months    Recent Outpatient Visits          2 weeks ago Restless leg syndrome   [place], Henrine Screws T, NP   2 months ago Aortic atherosclerosis ([place])   [place], Barbaraann Faster, NP   4 months ago Leg cramps   [place], [place] T, NP   6 months ago Thrombocytopenia ([place])   [place], Jolene T, NP   9 months ago Thrombocytopenia ([place])   [place], Barbaraann Faster, NP      Future Appointments            In 1 month Cannady, Barbaraann Faster, NP [facility]

## 2022-04-10 ENCOUNTER — Encounter: Payer: Self-pay | Admitting: Nurse Practitioner

## 2022-04-10 ENCOUNTER — Telehealth: Payer: Medicare PPO | Admitting: Nurse Practitioner

## 2022-04-10 DIAGNOSIS — U071 COVID-19: Secondary | ICD-10-CM | POA: Diagnosis not present

## 2022-04-10 MED ORDER — MOLNUPIRAVIR EUA 200MG CAPSULE: 4.0000 | ORAL_CAPSULE | Freq: Two times a day (BID) | ORAL | 0 refills | Status: AC

## 2022-04-10 MED ORDER — HYDROCOD POLI-CHLORPHE POLI ER 10-8 MG/5ML PO SUER
5.0000 mL | Freq: Every evening | ORAL | 0 refills | Status: DC | PRN
Start: 2022-04-10 — End: 2022-05-07

## 2022-04-10 NOTE — Progress Notes (Signed)
LMP  (LMP Unknown)    Subjective:    Patient ID: Patricia Ferguson, female    DOB: 1951-05-21, 71 y.o.   MRN: [id number]  HPI: Patricia ELIZARDO is a 71 y.o. female  Chief Complaint  Patient presents with   Covid Positive    Tested + for covid this morning. Patient reports having HA, post nasal drainage, cough.    UPPER RESPIRATORY TRACT INFECTION Worst symptom: Tested positive for COVID this morning.  Symptoms started yesterday.  Fever:  thinks she ran a fever last night Cough: yes Shortness of breath:  a little bit Wheezing: no Chest pain: no Chest tightness: no Chest congestion: yes Nasal congestion: yes Runny nose: no Post nasal drip: yes Sneezing: a little Sore throat: yes Swollen glands: no Sinus pressure: yes Headache: yes Face pain: no Toothache: no Ear pain: no bilateral Ear pressure: yes bilateral Eyes red/itching:no Eye drainage/crusting: no  Vomiting: no Rash: no Fatigue: yes Sick contacts: no Strep contacts: no  Context: worse Recurrent sinusitis: no Relief with OTC cold/cough medications: yes Treatments attempted:  ibuprofen    Relevant past medical, surgical, family and social history reviewed and updated as indicated. Interim medical history since our last visit reviewed. Allergies and medications reviewed and updated.  Review of Systems  Constitutional:  Positive for fatigue and fever.  HENT:  Positive for congestion, postnasal drip, sinus pressure, sinus pain, sneezing and sore throat. Negative for dental problem, ear pain and rhinorrhea.   Respiratory:  Positive for cough and shortness of breath. Negative for wheezing.   Cardiovascular:  Negative for chest pain.  Gastrointestinal:  Negative for vomiting.  Skin:  Negative for rash.  Neurological:  Positive for headaches.    Per HPI unless specifically indicated above     Objective:    LMP  (LMP Unknown)   Wt Readings from Last 3 Encounters:  03/14/22 196 lb 6.4 oz (89.1 kg)  01/16/22  201 lb 9.6 oz (91.4 kg)  12/12/21 198 lb (89.8 kg)    Physical Exam Vitals and nursing note reviewed.  HENT:     Head: Normocephalic.     Right Ear: Hearing normal.     Left Ear: Hearing normal.     Nose: Nose normal.  Eyes:     Pupils: Pupils are equal, round, and reactive to light.  Pulmonary:     Effort: Pulmonary effort is normal. No respiratory distress.  Neurological:     Mental Status: She is alert.  Psychiatric:        Mood and Affect: Mood normal.        Behavior: Behavior normal.        Thought Content: Thought content normal.        Judgment: Judgment normal.     Results for orders placed or performed in visit on 03/29/22  HM COLONOSCOPY  Result Value Ref Range   HM Colonoscopy See Report (in chart) See Report (in chart), Patient Reported      Assessment & Plan:   Problem List Items Addressed This Visit       Other   EGBTD-17 - Primary    COVID Positive.  Will treat with Molnupiravir.  Make sure to stay well hydrated.  Discussed signs and symptoms to monitor for and when to see higher level of care.  Will also give Tussinex for cough. Side effects and benefits of medication discussed during visit.  Supportive care. Increase fluids. Encouraged acetaminophen / ibuprofen as needed for fever/pain. Encouraged salt  water gargling, chloraseptic spray and throat lozenges. Encouraged OTC pseudoephedrine. Mucinex. Encouraged saline sinus flushes. Recommended neti pot. Encouraged humidified air.       Relevant Medications   molnupiravir EUA (LAGEVRIO) 200 mg CAPS capsule     Follow up plan: Return if symptoms worsen or fail to improve.  This visit was completed via MyChart due to the restrictions of the COVID-19 pandemic. All issues as above were discussed and addressed. Physical exam was done as above through visual confirmation on MyChart. If it was felt that the patient should be evaluated in the office, they were directed there. The patient verbally consented to  this visit. Location of the patient: Home Location of the provider: Office Those involved with this call:  Provider: Jon Billings, NP CMA: Valinda Hoar, [place] Desk/Registration: Lynnell Catalan This encounter was conducted via video.  I spent 20 dedicated to the care of this patient on the date of this encounter to include previsit review of symptoms, plan of care, mediations, follow up, face to face time with the patient, and post visit ordering of testing.

## 2022-04-10 NOTE — Assessment & Plan Note (Addendum)
COVID Positive.  Will treat with Molnupiravir.  Make sure to stay well hydrated.  Discussed signs and symptoms to monitor for and when to see higher level of care.  Will also give Tussinex for cough. Side effects and benefits of medication discussed during visit.  Supportive care. Increase fluids. Encouraged acetaminophen / ibuprofen as needed for fever/pain. Encouraged salt water gargling, chloraseptic spray and throat lozenges. Encouraged OTC pseudoephedrine. Mucinex. Encouraged saline sinus flushes. Recommended neti pot. Encouraged humidified air.

## 2022-04-10 NOTE — Telephone Encounter (Signed)
Pt scheduled with Santiago Glad today

## 2022-04-13 ENCOUNTER — Other Ambulatory Visit: Payer: Self-pay | Admitting: Nurse Practitioner

## 2022-04-14 NOTE — Telephone Encounter (Signed)
Requested Prescriptions  Pending Prescriptions Disp Refills  . famotidine (PEPCID) 20 MG tablet [Pharmacy Med Name: FAMOTIDINE 20 MG TAB] 90 tablet 3    Sig: TAKE 1 TABLET BY MOUTH ONCE DAILY     Gastroenterology:  H2 Antagonists Passed - 04/13/2022  3:38 PM      Passed - Valid encounter within last 12 months    Recent Outpatient Visits          4 days ago [place], Santiago Glad, NP   1 month ago Restless leg syndrome   [place], [place] T, NP   2 months ago Aortic atherosclerosis ([place])   [place] Cannady, Jolene T, NP   5 months ago Leg cramps   [place], [place] T, NP   7 months ago Thrombocytopenia ([place])   [place], [place] T, NP      Future Appointments            In 3 weeks Cannady, Barbaraann Faster, NP [facility]           . ezetimibe (ZETIA) 10 MG tablet [Pharmacy Med Name: EZETIMIBE 10 MG TAB] 90 tablet 0    Sig: TAKE 1 TABLET BY MOUTH ONCE DAILY     Cardiovascular:  Antilipid - Sterol Transport Inhibitors Failed - 04/13/2022  3:38 PM      Failed - Lipid Panel in normal range within the last 12 months    Cholesterol, Total  Date Value Ref Range Status  06/30/2021 166 100 - 199 mg/dL Final   LDL Chol Calc (NIH)  Date Value Ref Range Status  06/30/2021 97 0 - 99 mg/dL Final   HDL  Date Value Ref Range Status  06/30/2021 55 >39 mg/dL Final   Triglycerides  Date Value Ref Range Status  06/30/2021 74 0 - 149 mg/dL Final         Passed - AST in normal range and within 360 days    AST  Date Value Ref Range Status  06/30/2021 28 0 - 40 IU/L Final         Passed - ALT in normal range and within 360 days    ALT  Date Value Ref Range Status  06/30/2021 20 0 - 32 IU/L Final         Passed - Patient is not pregnant      Passed - Valid encounter within last 12 months    Recent Outpatient Visits          4 days ago [place], Karen, NP   1 month ago Restless leg syndrome   [place], [place] T, NP   2 months ago Aortic atherosclerosis ([place])   [place], Henrine Screws T, NP   5 months ago Leg cramps   [place], [place] T, NP   7 months ago Thrombocytopenia ([place])   [place], Barbaraann Faster, NP      Future Appointments            In 3 weeks Cannady, Barbaraann Faster, NP [facility]

## 2022-04-19 ENCOUNTER — Telehealth: Payer: Self-pay

## 2022-04-19 NOTE — Telephone Encounter (Signed)
Received voice message from patient inquiring if she needs to reschedule her colonoscopy due to having COVID last week.  Returned phone call lvm for her to call me back to inform her that we will need to reschedule her colonoscopy after October 18th since she tested positive on 04/10/22.  Thanks,  [place], [place]

## 2022-05-07 NOTE — Patient Instructions (Signed)
Restless Legs Syndrome Restless legs syndrome is a condition that causes uncomfortable feelings or sensations in the legs, especially while sitting or lying down. The sensations usually cause an overwhelming urge to move the legs. The arms can also sometimes be affected. The condition can range from mild to severe. The symptoms often interfere with a person's ability to sleep. What are the causes? The cause of this condition is not known. What increases the risk? The following factors may make you more likely to develop this condition: Being older than 50. Pregnancy. Being a woman. In general, the condition is more common in women than in men. A family history of the condition. Having iron deficiency. Overuse of caffeine, nicotine, or alcohol. Certain medical conditions, such as kidney disease, Parkinson's disease, or nerve damage. Certain medicines, such as those for high blood pressure, nausea, colds, allergies, depression, and some heart conditions. What are the signs or symptoms? The main symptom of this condition is uncomfortable sensations in the legs, such as: Pulling. Tingling. Prickling. Throbbing. Crawling. Burning. Usually, the sensations: Affect both sides of the body. Are worse when you sit or lie down. Are worse at night. These may make it difficult to fall asleep. Make you have a strong urge to move your legs. Are temporarily relieved by moving your legs or standing. The arms can also be affected, but this is rare. People who have this condition often have tiredness during the day because of their lack of sleep at night. How is this diagnosed? This condition may be diagnosed based on: Your symptoms. Blood tests. In some cases, you may be monitored in a sleep lab by a specialist (a sleep study). This can detect any disruptions in your sleep. How is this treated? This condition is treated by managing the symptoms. This may include: Lifestyle changes, such as  exercising, using relaxation techniques, and avoiding caffeine, alcohol, or tobacco. Iron supplements. Medicines. Parkinson's medications may be tried first. Anti-seizure medications can also be helpful. Follow these instructions at home: General instructions Take over-the-counter and prescription medicines only as told by your health care provider. Use methods to help relieve the uncomfortable sensations, such as: Massaging your legs. Walking or stretching. Taking a cold or hot bath. Keep all follow-up visits. This is important. Lifestyle     Practice good sleep habits. For example, go to bed and get up at the same time every day. Most adults should get 7-9 hours of sleep each night. Exercise regularly. Try to get at least 30 minutes of exercise most days of the week. Practice ways of relaxing, such as yoga or meditation. Avoid caffeine and alcohol. Do not use any products that contain nicotine or tobacco. These products include cigarettes, chewing tobacco, and vaping devices, such as e-cigarettes. If you need help quitting, ask your health care provider. Where to find more information National Institute of Neurological Disorders and Stroke: www.ninds.nih.gov Contact a health care provider if: Your symptoms get worse or they do not improve with treatment. Summary Restless legs syndrome is a condition that causes uncomfortable feelings or sensations in the legs, especially while sitting or lying down. The symptoms often interfere with your ability to sleep. This condition is treated by managing the symptoms. You may need to make lifestyle changes or take medicines. This information is not intended to replace advice given to you by your health care provider. Make sure you discuss any questions you have with your health care provider. Document Revised: 02/20/2021 Document Reviewed: 02/20/2021 Elsevier Patient Education    2023 Elsevier Inc.  

## 2022-05-09 ENCOUNTER — Encounter: Payer: Self-pay | Admitting: Nurse Practitioner

## 2022-05-09 ENCOUNTER — Ambulatory Visit (INDEPENDENT_AMBULATORY_CARE_PROVIDER_SITE_OTHER): Payer: Medicare PPO | Admitting: Nurse Practitioner

## 2022-05-09 VITALS — BP 102/69 | HR 61 | Temp 98.2°F | Ht 66.0 in | Wt 201.1 lb

## 2022-05-09 DIAGNOSIS — D696 Thrombocytopenia, unspecified: Secondary | ICD-10-CM | POA: Diagnosis not present

## 2022-05-09 DIAGNOSIS — M2041 Other hammer toe(s) (acquired), right foot: Secondary | ICD-10-CM

## 2022-05-09 DIAGNOSIS — Z0000 Encounter for general adult medical examination without abnormal findings: Secondary | ICD-10-CM | POA: Diagnosis not present

## 2022-05-09 DIAGNOSIS — E559 Vitamin D deficiency, unspecified: Secondary | ICD-10-CM | POA: Diagnosis not present

## 2022-05-09 DIAGNOSIS — G2581 Restless legs syndrome: Secondary | ICD-10-CM

## 2022-05-09 DIAGNOSIS — E782 Mixed hyperlipidemia: Secondary | ICD-10-CM | POA: Diagnosis not present

## 2022-05-09 DIAGNOSIS — R12 Heartburn: Secondary | ICD-10-CM | POA: Diagnosis not present

## 2022-05-09 DIAGNOSIS — Z6834 Body mass index (BMI) 34.0-34.9, adult: Secondary | ICD-10-CM

## 2022-05-09 DIAGNOSIS — M85831 Other specified disorders of bone density and structure, right forearm: Secondary | ICD-10-CM | POA: Diagnosis not present

## 2022-05-09 DIAGNOSIS — E6609 Other obesity due to excess calories: Secondary | ICD-10-CM

## 2022-05-09 DIAGNOSIS — E538 Deficiency of other specified B group vitamins: Secondary | ICD-10-CM | POA: Diagnosis not present

## 2022-05-09 DIAGNOSIS — M791 Myalgia, unspecified site: Secondary | ICD-10-CM

## 2022-05-09 DIAGNOSIS — T466X5A Adverse effect of antihyperlipidemic and antiarteriosclerotic drugs, initial encounter: Secondary | ICD-10-CM

## 2022-05-09 DIAGNOSIS — F4323 Adjustment disorder with mixed anxiety and depressed mood: Secondary | ICD-10-CM

## 2022-05-09 DIAGNOSIS — I700 Atherosclerosis of aorta: Secondary | ICD-10-CM

## 2022-05-09 DIAGNOSIS — Z23 Encounter for immunization: Secondary | ICD-10-CM

## 2022-05-09 MED ORDER — GABAPENTIN 100 MG PO CAPS: 100.0000 mg | ORAL_CAPSULE | Freq: Every day | ORAL | 4 refills | Status: DC

## 2022-05-09 MED ORDER — EZETIMIBE 10 MG PO TABS
10.0000 mg | ORAL_TABLET | Freq: Every day | ORAL | 4 refills | Status: DC
Start: 2022-05-09 — End: 2023-07-12

## 2022-05-09 NOTE — Assessment & Plan Note (Signed)
Ongoing and stable with Pepcid.  Will continue current medication regimen and adjust as needed.  Obtain CMP and Mag level today.

## 2022-05-09 NOTE — Assessment & Plan Note (Signed)
Chronic, ongoing, recheck level today and continue current supplement. May need to increase supplement dosing if remains <30.

## 2022-05-09 NOTE — Assessment & Plan Note (Signed)
Ongoing, stable at this time.  Does not wish to start medication at this time.  Highly recommend she return to therapy via [facility] and offered empathetic listening today.

## 2022-05-09 NOTE — Assessment & Plan Note (Signed)
History of trial on various statins with side effect of myalgia, continue Zetia and tolerating Crestor at three days a week at this time.  Continue this and if any adverse effects will consider change to Repatha.

## 2022-05-09 NOTE — Assessment & Plan Note (Signed)
Chronic.  On holiday from [place], started January 2022.  DEXA in 2019 and 2014 noted osteopenia.  Repeat DEXA scheduled, if needed will return to [place] dosing.  Vitamin D level today.

## 2022-05-09 NOTE — Assessment & Plan Note (Signed)
Sporadic noted on labs over past 5 years on occasion -- 147 and 148 range in the past. Recent labs have been stable. CBC w/ differential and platelet checked today.
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
## 2022-05-09 NOTE — Assessment & Plan Note (Signed)
Chronic, ongoing.   - Continue Gabapentin and Requip. - Continue Magnesium and B12 supplements + iron - She is trying mustard and tonic water, as recommended by vascular, not seeing much benefit.   - Could consider period of physical therapy or massage therapy if ongoing.  Recommend she wear compression hose on during day and off at night.

## 2022-05-09 NOTE — Assessment & Plan Note (Signed)
BMI 32.46.  Recommended eating smaller high protein, low fat meals more frequently and exercising 30 mins a day 5 times a week with a goal of 10-15lb weight loss in the next 3 months. Patient voiced their understanding and motivation to adhere to these recommendations.  

## 2022-05-09 NOTE — Assessment & Plan Note (Signed)
Chronic, ongoing.  Noted on labs initially 09/14/21, level 199.  Continue supplement and recheck today.

## 2022-05-09 NOTE — Assessment & Plan Note (Signed)
Chronic, ongoing with poor statin tolerance in past. Continue current medication regimen, Zetia and Crestor as is tolerating this on 3 day a week regimen.  Could consider discontinuation of Zetia in future if improved levels.  Lipid panel and CMP today.

## 2022-05-09 NOTE — Assessment & Plan Note (Signed)
Chronic.  Noted on x-ray imaging 11/15/21.  Recommend she continue statin as ordered and take daily Baby ASA for prevention.

## 2022-05-10 LAB — VITAMIN B12: Vitamin B-12: 515 pg/mL (ref 232–1245)

## 2022-05-10 LAB — LIPID PANEL W/O CHOL/HDL RATIO
Cholesterol, Total: 149 mg/dL (ref 100–199)
HDL: 51 mg/dL (ref >39)
LDL Chol Calc (NIH): 85 mg/dL (ref 0–99)
Triglycerides: 63 mg/dL (ref 0–149)
VLDL Cholesterol Cal: 13 mg/dL (ref 5–40)

## 2022-05-10 LAB — COMPREHENSIVE METABOLIC PANEL
ALT: 14 IU/L (ref 0–32)
AST: 23 IU/L (ref 0–40)
Albumin/Globulin Ratio: 2.5 — ABNORMAL HIGH (ref 1.2–2.2)
Albumin: 4.2 g/dL (ref 3.9–4.9)
Alkaline Phosphatase: 62 IU/L (ref 44–121)
BUN/Creatinine Ratio: 29 — ABNORMAL HIGH (ref 12–28)
BUN: 18 mg/dL (ref 8–27)
Bilirubin Total: 1.1 mg/dL (ref 0.0–1.2)
CO2: 24 mmol/L (ref 20–29)
Calcium: 8.9 mg/dL (ref 8.7–10.3)
Chloride: 104 mmol/L (ref 96–106)
Creatinine, Ser: 0.62 mg/dL (ref 0.57–1.00)
Globulin, Total: 1.7 g/dL (ref 1.5–4.5)
Glucose: 88 mg/dL (ref 70–99)
Potassium: 4.1 mmol/L (ref 3.5–5.2)
Sodium: 142 mmol/L (ref 134–144)
Total Protein: 5.9 g/dL — ABNORMAL LOW (ref 6.0–8.5)
eGFR: 96 mL/min/{1.73_m2} (ref >59)

## 2022-05-10 LAB — CBC WITH DIFFERENTIAL/PLATELET
Basophils Absolute: 0.1 10*3/uL (ref 0.0–0.2)
Basos: 2 %
EOS (ABSOLUTE): 0.2 10*3/uL (ref 0.0–0.4)
Eos: 4 %
Hematocrit: 38.4 % (ref 34.0–46.6)
Hemoglobin: 12.5 g/dL (ref 11.1–15.9)
Immature Grans (Abs): 0 10*3/uL (ref 0.0–0.1)
Immature Granulocytes: 0 %
Lymphocytes Absolute: 1.4 10*3/uL (ref 0.7–3.1)
Lymphs: 31 %
MCH: 29.6 pg (ref 26.6–33.0)
MCHC: 32.6 g/dL (ref 31.5–35.7)
MCV: 91 fL (ref 79–97)
Monocytes Absolute: 0.4 10*3/uL (ref 0.1–0.9)
Monocytes: 10 %
Neutrophils Absolute: 2.3 10*3/uL (ref 1.4–7.0)
Neutrophils: 53 %
Platelets: 159 10*3/uL (ref 150–450)
RBC: 4.22 x10E6/uL (ref 3.77–5.28)
RDW: 13.7 % (ref 11.7–15.4)
WBC: 4.3 10*3/uL (ref 3.4–10.8)

## 2022-05-10 LAB — MAGNESIUM: Magnesium: 1.9 mg/dL (ref 1.6–2.3)

## 2022-05-10 LAB — TSH: TSH: 2.14 u[IU]/mL (ref 0.450–4.500)

## 2022-05-10 LAB — VITAMIN D 25 HYDROXY (VIT D DEFICIENCY, FRACTURES): Vit D, 25-Hydroxy: 29.7 ng/mL — ABNORMAL LOW (ref 30.0–100.0)

## 2022-05-10 NOTE — Progress Notes (Signed)
Contacted via MyChart   Good afternoon Patricia Ferguson, your labs have returned and overall these remain stable with exception of Vitamin D still being a little low -- please ensure to continue supplement daily.  Any questions?  No other changes needed. Keep being amazing!!  Thank you for allowing me to participate in your care.  I appreciate you. Kindest regards, Keajah Killough

## 2022-05-12 ENCOUNTER — Ambulatory Visit (INDEPENDENT_AMBULATORY_CARE_PROVIDER_SITE_OTHER): Payer: Medicare PPO

## 2022-05-12 ENCOUNTER — Ambulatory Visit: Payer: Medicare PPO | Admitting: Podiatry

## 2022-05-12 DIAGNOSIS — M2041 Other hammer toe(s) (acquired), right foot: Secondary | ICD-10-CM

## 2022-05-12 DIAGNOSIS — M2042 Other hammer toe(s) (acquired), left foot: Secondary | ICD-10-CM

## 2022-05-15 NOTE — Progress Notes (Signed)
   Chief Complaint  Patient presents with   Hammer Toe    Patient is here for bilateral hammertoe.    HPI: 71 y.o. female presenting today as a new patient for evaluation of symptomatic hammertoes to the bilateral feet.  Patient has had progressive hammertoe deformities develop over the past few years.  She would like to have them evaluated today.  Gradual onset.  Denies a history of injury.  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  Dermatology: Skin is cool, dry and supple bilateral lower extremities. Negative for open lesions or macerations.  Vascular: Palpable pedal pulses bilaterally. No edema or erythema noted. Capillary refill within normal limits.  Neurological: Epicritic and protective threshold grossly intact bilaterally.   Musculoskeletal Exam: All pedal and ankle joints range of motion within normal limits bilateral. Muscle strength 5/5 in all groups bilateral. Hammertoe contracture deformity noted to the second digits of the bilateral foot.  Radiographic Exam B/L feet 05/12/2022: Hammertoe contracture deformity noted to the interphalangeal joints and MPJ of the respective hammertoe digits mentioned on clinical musculoskeletal exam.     Assessment: 1.  Hammertoe second digits bilateral   Plan of Care:  1. Patient evaluated. X-Rays reviewed.  2.  Today we discussed different treatment options both surgical and conservative.  She has tried different shoes with minimal relief or alleviation of her symptoms.  Unfortunately she continues to have pain and tenderness on a daily  basis. 3.  Today we did discuss surgery in detail.  Risk benefits advantages and disadvantages as well as the postoperative recovery course for the hammertoe deformity was discussed.  She understands that she would be weightbearing in the postsurgical shoes for about 4-6 weeks. 4.  Return to clinic as needed  Edrick Kins, DPM [facility]  Dr. Edrick Kins, DPM    [address], [place] [address]                Office [phone]  Fax [phone]

## 2022-05-22 ENCOUNTER — Encounter (INDEPENDENT_AMBULATORY_CARE_PROVIDER_SITE_OTHER): Payer: Self-pay

## 2022-05-23 ENCOUNTER — Ambulatory Visit
Admission: RE | Admit: 2022-05-23 | Discharge: 2022-05-23 | Disposition: A | Discharge status: Home / Self Care | Payer: Medicare PPO | Source: Ambulatory Visit | Attending: Nurse Practitioner | Admitting: Nurse Practitioner

## 2022-05-23 DIAGNOSIS — Z780 Asymptomatic menopausal state: Secondary | ICD-10-CM | POA: Diagnosis not present

## 2022-05-23 DIAGNOSIS — M8589 Other specified disorders of bone density and structure, multiple sites: Secondary | ICD-10-CM | POA: Diagnosis not present

## 2022-05-23 NOTE — Progress Notes (Signed)
Contacted via MyChart   Good afternoon Patricia Ferguson.  Your bone density shows thinning bones (osteopenia) but not brittle (osteoporosis). We recommend Vitamin D supplementation of about 2,0000 IUs of over the counter Vitamin D3. In addition, we recommend a diet high in calcium with dairy and dark green leafy vegetables. We would like you to get plenty of weight bearing exercises with walking and resistance training such as light weights or resistance bands available with instructions at places such as [employer].  The good news is last DEXA showed T-score -2.3 and this time has improved to -1.6.  So continue Vitamin D and being active:)

## 2022-06-02 ENCOUNTER — Ambulatory Visit: Payer: Medicare PPO | Admitting: Certified Registered Nurse Anesthetist

## 2022-06-02 ENCOUNTER — Ambulatory Visit
Admission: RE | Admit: 2022-06-02 | Discharge: 2022-06-02 | Disposition: A | Discharge status: Home / Self Care | Payer: Medicare PPO | Attending: Gastroenterology | Admitting: Gastroenterology

## 2022-06-02 ENCOUNTER — Encounter: Payer: Self-pay | Admitting: Gastroenterology

## 2022-06-02 ENCOUNTER — Encounter
Admission: RE | Disposition: A | Discharge status: Home / Self Care | Payer: Self-pay | Source: Home / Self Care | Attending: Gastroenterology

## 2022-06-02 DIAGNOSIS — E7800 Pure hypercholesterolemia, unspecified: Secondary | ICD-10-CM | POA: Insufficient documentation

## 2022-06-02 DIAGNOSIS — Z860100 Personal history of colon polyps, unspecified: Secondary | ICD-10-CM

## 2022-06-02 DIAGNOSIS — D126 Benign neoplasm of colon, unspecified: Secondary | ICD-10-CM | POA: Diagnosis not present

## 2022-06-02 DIAGNOSIS — D125 Benign neoplasm of sigmoid colon: Secondary | ICD-10-CM | POA: Insufficient documentation

## 2022-06-02 DIAGNOSIS — E669 Obesity, unspecified: Secondary | ICD-10-CM | POA: Diagnosis not present

## 2022-06-02 DIAGNOSIS — Z83711 Family history of hyperplastic colon polyps: Secondary | ICD-10-CM | POA: Insufficient documentation

## 2022-06-02 DIAGNOSIS — Z90710 Acquired absence of both cervix and uterus: Secondary | ICD-10-CM | POA: Diagnosis not present

## 2022-06-02 DIAGNOSIS — Z1211 Encounter for screening for malignant neoplasm of colon: Secondary | ICD-10-CM | POA: Insufficient documentation

## 2022-06-02 DIAGNOSIS — K635 Polyp of colon: Secondary | ICD-10-CM | POA: Diagnosis not present

## 2022-06-02 DIAGNOSIS — Z6835 Body mass index (BMI) 35.0-35.9, adult: Secondary | ICD-10-CM | POA: Diagnosis not present

## 2022-06-02 HISTORY — DX: Pneumonia, unspecified organism: J18.9

## 2022-06-02 HISTORY — PX: COLONOSCOPY WITH PROPOFOL: SHX5780

## 2022-06-02 SURGERY — COLONOSCOPY WITH PROPOFOL
Anesthesia: General

## 2022-06-02 MED ORDER — PROPOFOL 500 MG/50ML IV EMUL
INTRAVENOUS | Status: DC | PRN
  Administered 2022-06-02: 150 ug/kg/min via INTRAVENOUS

## 2022-06-02 MED ORDER — SODIUM CHLORIDE 0.9 % IV SOLN
INTRAVENOUS | Status: DC
  Administered 2022-06-02: 1000 mL via INTRAVENOUS

## 2022-06-02 MED ORDER — LIDOCAINE HCL (CARDIAC) PF 100 MG/5ML IV SOSY
PREFILLED_SYRINGE | INTRAVENOUS | Status: DC | PRN
  Administered 2022-06-02: 50 mg via INTRAVENOUS

## 2022-06-02 MED ORDER — PROPOFOL 10 MG/ML IV BOLUS
INTRAVENOUS | Status: DC | PRN
  Administered 2022-06-02: 80 mg via INTRAVENOUS

## 2022-06-02 NOTE — Transfer of Care (Signed)
Immediate Anesthesia Transfer of Care Note  Patient: Patricia Ferguson  Procedure(s) Performed: COLONOSCOPY WITH PROPOFOL  Patient Location: PACU  Anesthesia Type:General  Level of Consciousness: awake and drowsy  Airway & Oxygen Therapy: Patient Spontanous Breathing  Post-op Assessment: Report given to RN and Post -op Vital signs reviewed and stable  Post vital signs: Reviewed and stable  Last Vitals:  Vitals Value Taken Time  BP 98/63 06/02/22 1109  Temp    Pulse 88 06/02/22 1109  Resp 21 06/02/22 1109  SpO2 98 % 06/02/22 1109  Vitals shown include unvalidated device data.  Last Pain:  Vitals:   06/02/22 1108  TempSrc:   PainSc: 0-No pain         Complications: No notable events documented.
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
## 2022-06-02 NOTE — Op Note (Signed)
[facility] Gastroenterology Patient Name: Patricia Ferguson Procedure Date: 06/02/2022 10:18 AM MRN: [id number] Account #: [id number] Date of Birth: Sep 06, 1950 Admit Type: Outpatient Age: 71 Room: [facility] ENDO ROOM 4 Gender: Female Note Status: Finalized Instrument Name: Jasper Riling [id number] Procedure:             Colonoscopy Indications:           Surveillance: Personal history of adenomatous polyps                         on last colonoscopy 5 years ago Providers:             Jonathon Bellows MD, MD Referring MD:          Franchot Gallo (Referring MD) Medicines:             Monitored Anesthesia Care Complications:         No immediate complications. Procedure:             Pre-Anesthesia Assessment:                        - Prior to the procedure, a History and Physical was                         performed, and patient medications, allergies and                         sensitivities were reviewed. The patient's tolerance                         of previous anesthesia was reviewed.                        - The risks and benefits of the procedure and the                         sedation options and risks were discussed with the                         patient. All questions were answered and informed                         consent was obtained.                        - ASA Grade Assessment: II - A patient with mild                         systemic disease.                        After obtaining informed consent, the colonoscope was                         passed under direct vision. Throughout the procedure,                         the patient's blood pressure, pulse, and oxygen                         saturations were  monitored continuously. The                         Colonoscope was introduced through the anus and                         advanced to the the cecum, identified by the                         appendiceal orifice. The colonoscopy was performed                          with ease. The patient tolerated the procedure well.                         The quality of the bowel preparation was good. The                         appendiceal orifice was photographed. Findings:      The perianal and digital rectal examinations were normal.      A 7 mm polyp was found in the sigmoid colon. The polyp was sessile. The       polyp was removed with a cold snare. Resection and retrieval were       complete. To prevent bleeding after the polypectomy, one hemostatic clip       was successfully placed. There was no bleeding at the end of the       procedure.      The exam was otherwise without abnormality on direct and retroflexion       views. Impression:            - One 7 mm polyp in the sigmoid colon, removed with a                         cold snare. Resected and retrieved. Clip was placed.                        - The examination was otherwise normal on direct and                         retroflexion views. Recommendation:        - Discharge patient to home (with escort).                        - Resume previous diet.                        - Continue present medications.                        - Await pathology results.                        - Repeat colonoscopy for surveillance based on                         pathology results. Procedure Code(s):     --- Professional ---  45385, Colonoscopy, flexible; with removal of                         tumor(s), polyp(s), or other lesion(s) by snare                         technique Diagnosis Code(s):     --- Professional ---                        Z86.010, Personal history of colonic polyps                        D12.5, Benign neoplasm of sigmoid colon CPT copyright 2022 American Medical Association. All rights reserved. The codes documented in this report are preliminary and upon coder review may  be revised to meet current compliance requirements. Jonathon Bellows, MD Jonathon Bellows MD, MD 06/02/2022  11:05:01 AM This report has been signed electronically. Number of Addenda: 0 Note Initiated On: 06/02/2022 10:18 AM Scope Withdrawal Time: 0 hours 13 minutes 22 seconds  Total Procedure Duration: 0 hours 18 minutes 49 seconds  Estimated Blood Loss:  Estimated blood loss: none.      [facility]

## 2022-06-02 NOTE — Anesthesia Postprocedure Evaluation (Signed)
Anesthesia Post Note  Patient: Patricia Ferguson  Procedure(s) Performed: COLONOSCOPY WITH PROPOFOL  Patient location during evaluation: Endoscopy Anesthesia Type: General Level of consciousness: awake and alert Pain management: pain level controlled Vital Signs Assessment: post-procedure vital signs reviewed and stable Respiratory status: spontaneous breathing, nonlabored ventilation, respiratory function stable and patient connected to nasal cannula oxygen Cardiovascular status: blood pressure returned to baseline and stable Postop Assessment: no apparent nausea or vomiting Anesthetic complications: no   No notable events documented.   Last Vitals:  Vitals:   06/02/22 1108 06/02/22 1109  BP: 98/63 98/63  Pulse:  86  Resp:  18  Temp:    SpO2:  99%    Last Pain:  Vitals:   06/02/22 1108  TempSrc:   PainSc: 0-No pain                 Ilene Qua

## 2022-06-02 NOTE — Anesthesia Procedure Notes (Signed)
Date/Time: 06/02/2022 10:43 AM  Performed by: Johnna Acosta, CRNAPre-anesthesia Checklist: Patient identified, Emergency Drugs available, Suction available, Patient being monitored and Timeout performed Patient Re-evaluated:Patient Re-evaluated prior to induction Oxygen Delivery Method: Nasal cannula Preoxygenation: Pre-oxygenation with 100% oxygen Induction Type: IV induction

## 2022-06-02 NOTE — Anesthesia Preprocedure Evaluation (Signed)
Anesthesia Evaluation  Patient identified by MRN, date of birth, ID band Patient awake    Reviewed: Allergy & Precautions, NPO status , Patient's Chart, lab work & pertinent test results  History of Anesthesia Complications Negative for: history of anesthetic complications  Airway Mallampati: II  TM Distance: >3 FB Neck ROM: full    Dental  (+) Chipped   Pulmonary neg pulmonary ROS   Pulmonary exam normal        Cardiovascular negative cardio ROS Normal cardiovascular exam     Neuro/Psych  PSYCHIATRIC DISORDERS Anxiety Depression    negative neurological ROS     GI/Hepatic Neg liver ROS,GERD  Medicated,,  Endo/Other  negative endocrine ROS    Renal/GU negative Renal ROS  negative genitourinary   Musculoskeletal  (+) Arthritis ,    Abdominal   Peds  Hematology negative hematology ROS (+)   Anesthesia Other Findings Past Medical History: No date: Allergy No date: Arthritis     Comment:  right knee No date: FH: colonic polyps No date: GERD (gastroesophageal reflux disease) No date: Hypercholesterolemia No date: Internal hemorrhoids No date: Obesity No date: Osteopenia No date: Pneumonia 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 SCREWS IN BACK No date: bone spur 1980's: BREAST BIOPSY; Right No date: BREAST SURGERY No date: CERVICAL CONE BIOPSY No date: lumbar fission 01/07/2020: MASS EXCISION; Left     Comment:  Procedure: EXCISION MASS (RESECTION OF ANTECUBITAL               VENOUS ANEURYSM);  Surgeon: Katha Cabal, MD;                Location: [facility] ORS;  Service: Vascular;  Laterality:               Left;  BMI    Body Mass Index: 35.33 kg/m      Reproductive/Obstetrics negative OB ROS                              Anesthesia Physical Anesthesia Plan  ASA: 2  Anesthesia Plan: General   Post-op Pain Management: Minimal or no pain anticipated   Induction: Intravenous  PONV Risk Score and Plan: Propofol infusion and TIVA  Airway Management Planned: Natural Airway and Nasal Cannula  Additional Equipment:   Intra-op Plan:   Post-operative Plan:   Informed Consent: I have reviewed the patients History and Physical, chart, labs and discussed the procedure including the risks, benefits and alternatives for the proposed anesthesia with the patient or authorized representative who has indicated his/her understanding and acceptance.     Dental Advisory Given  Plan Discussed with: Anesthesiologist, CRNA and Surgeon  Anesthesia Plan Comments: (Patient consented for risks of anesthesia including but not limited to:  - adverse reactions to medications - risk of airway placement if required - damage to eyes, teeth, lips or other oral mucosa - nerve damage due to positioning  - sore throat or hoarseness - Damage to heart, brain, nerves, lungs, other parts of body or loss of life  Patient voiced understanding.)       Anesthesia Quick Evaluation

## 2022-06-05 ENCOUNTER — Encounter: Payer: Self-pay | Admitting: Gastroenterology

## 2022-06-05 LAB — SURGICAL PATHOLOGY

## 2022-06-06 ENCOUNTER — Ambulatory Visit: Payer: Medicare PPO | Admitting: Podiatry

## 2022-06-06 DIAGNOSIS — M2041 Other hammer toe(s) (acquired), right foot: Secondary | ICD-10-CM

## 2022-06-06 DIAGNOSIS — M2042 Other hammer toe(s) (acquired), left foot: Secondary | ICD-10-CM | POA: Diagnosis not present

## 2022-06-06 NOTE — Progress Notes (Signed)
   Chief Complaint  Patient presents with   Follow-up    Patient is here for follow-up for bilateral 2nd toe.Patient states that she is still having pain and look bigger than last visit.    HPI: 71 y.o. female presenting today for follow-up evaluation of symptomatic hammertoes to the bilateral feet.  Patient has had progressive hammertoe deformities develop over the past few years.  She would like to have them evaluated today.  Gradual onset.  Denies a history of injury.  Patient states that the injections did not help alleviate any of her symptoms or pain.  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  Dermatology: Skin is cool, dry and supple bilateral lower extremities. Negative for open lesions or macerations.  Vascular: Palpable pedal pulses bilaterally. No edema or erythema noted. Capillary refill within normal limits.  Neurological: Epicritic and protective threshold grossly intact bilaterally.   Musculoskeletal Exam: All pedal and ankle joints range of motion within normal limits bilateral. Muscle strength 5/5 in all groups bilateral. Hammertoe contracture deformity noted to the second digits of the bilateral foot.  Radiographic Exam B/L feet 05/12/2022: Hammertoe contracture deformity noted to the interphalangeal joints and MPJ of the respective hammertoe digits mentioned on clinical musculoskeletal exam.     Assessment: 1.  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 Patient evaluated. X-Rays reviewed again today.  2.  Surgery was again  discussed in detail today.  Risk benefits advantages and disadvantages explained.  Postoperative recovery course explained.  I explained to the patient that I would prefer to correct one foot at a time.  Patient works part-time 6 hours/day.  Recommend 8 weeks off of work 3.  Patient is contemplating having surgery to correct for the symptomatic hammertoes next summer.  Return to clinic April 2024 for possible surgical consultation  Edrick Kins, DPM [facility]  Dr. Edrick Kins, DPM    [address], [place] [address]                Office [phone]  Fax [phone]

## 2022-06-07 ENCOUNTER — Encounter: Payer: Self-pay | Admitting: Gastroenterology

## 2022-07-28 ENCOUNTER — Other Ambulatory Visit: Payer: Self-pay | Admitting: Nurse Practitioner

## 2022-07-28 DIAGNOSIS — Z1231 Encounter for screening mammogram for malignant neoplasm of breast: Secondary | ICD-10-CM

## 2022-08-10 ENCOUNTER — Ambulatory Visit
Admission: RE | Admit: 2022-08-10 | Discharge: 2022-08-10 | Disposition: A | Discharge status: Home / Self Care | Payer: Medicare PPO | Source: Ambulatory Visit | Attending: Nurse Practitioner | Admitting: Nurse Practitioner

## 2022-08-10 DIAGNOSIS — Z1231 Encounter for screening mammogram for malignant neoplasm of breast: Secondary | ICD-10-CM | POA: Diagnosis not present

## 2022-08-11 NOTE — Progress Notes (Signed)
Contacted via MyChart   Normal mammogram, may repeat in one year:)

## 2022-09-27 ENCOUNTER — Telehealth: Payer: Self-pay | Admitting: Nurse Practitioner

## 2022-09-27 NOTE — Telephone Encounter (Signed)
Pt is calling to report that she received a letter from  [facility] plan that Henrine Screws is no longer in network effective August 03, 2022. Please advise CB- [phone]

## 2022-09-27 NOTE — Telephone Encounter (Signed)
Called patient to let her know that the letter was sent in error and to disregard.

## 2022-10-10 ENCOUNTER — Encounter: Payer: Self-pay | Admitting: Podiatry

## 2022-10-10 ENCOUNTER — Ambulatory Visit: Payer: Medicare PPO | Admitting: Podiatry

## 2022-10-10 VITALS — BP 117/67 | HR 58

## 2022-10-10 DIAGNOSIS — M2041 Other hammer toe(s) (acquired), right foot: Secondary | ICD-10-CM

## 2022-10-10 DIAGNOSIS — M2042 Other hammer toe(s) (acquired), left foot: Secondary | ICD-10-CM | POA: Diagnosis not present

## 2022-10-10 NOTE — Progress Notes (Signed)
   Chief Complaint  Patient presents with   Hammer Toe    "Dr. Amalia Hailey and I had talked about surgery."    HPI: 72 y.o. female presenting today for follow-up evaluation of symptomatic hammertoes to the bilateral feet.  Patient last seen in the office 06/06/2022.  Patient presents today to discuss surgical correction of her symptomatic hammertoes.  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  Dermatology: Skin is cool, dry and supple bilateral lower extremities. Negative for open lesions or macerations.  Vascular: Palpable pedal pulses bilaterally. No edema or erythema noted. Capillary refill within normal limits.  Neurological: Epicritic and protective threshold grossly intact bilaterally.   Musculoskeletal Exam: All pedal and ankle joints range of motion within normal limits bilateral. Muscle strength 5/5 in all groups bilateral. Hammertoe contracture deformity noted to the digits 2, 3, 4 of the bilateral feet  Radiographic Exam B/L feet 05/12/2022: Hammertoe contracture deformity noted to the interphalangeal joints and MPJ of the respective hammertoe digits mentioned on clinical musculoskeletal exam.     Assessment: 1.  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 Patient evaluated. X-Rays reviewed again today.  2.  Today we discussed surgery in detail including arthroplasty of the second third and fourth digit of the right foot with possible MTP capsulotomy  of the second digit.  The patient is ready to proceed with surgery.  Risk benefits advantages and disadvantages were explained in detail to the patient.  No no guarantees were expressed or implied.  All patient questions answered.  She understands that she will be WBAT in a postsurgical cam boot approximately 4-6 weeks after surgery.  She is planning to take approximately 8 weeks off after surgery.  The patient is also requesting that I give her cortisone injection into the second digit of the left foot to help reduce the inflammation and alleviate some of the pain while she is recovering from right foot surgery 3.  Authorization for surgery was initiated today.  Surgery will consist of PIPJ arthroplasty digits 2, 3, 4 right foot with possible MTP capsulotomy.  Cortisone injection second digit left foot to help alleviate the second digit hammertoe pain while she is recovering postoperatively 4.  Return to clinic 1 week postop  *[employer] part-time 6hrs per day *Going on a beach trip third week of June  Edrick Kins, DPM [facility]  Dr. Edrick Kins, DPM    [address], [place] [address]                Office [phone]  Fax [phone]

## 2022-11-04 NOTE — Patient Instructions (Signed)
Be Involved in Your Health Care:  Taking Medications When medications are taken as directed, they can greatly improve your health. But if they are not taken as instructed, they may not work. In some cases, not taking them correctly can be harmful. To help ensure your treatment remains effective and safe, understand your medications and how to take them.  Your lab results, notes and after visit summary will be available on My Chart. We strongly encourage you to use this feature. If lab results are abnormal the clinic will contact you with the appropriate steps. If the clinic does not contact you assume the results are satisfactory. You can always see your results on My Chart. If you have questions regarding your condition, please contact the clinic during office hours. You can also ask questions on My Chart.  We at [facility] are grateful that you chose [place] to provide care. We strive to provide excellent and compassionate care and are always looking for feedback. If you get a survey from the clinic please complete this.    Preventing High Cholesterol Cholesterol is a white, waxy substance similar to fat that the human body needs to help build cells. The liver makes all the cholesterol that a person's body needs. Having high cholesterol (hypercholesterolemia) increases your risk for heart disease and stroke. Extra or excess cholesterol comes from the food that you eat. High cholesterol can often be prevented with diet and lifestyle changes. If you already have high cholesterol, you can control it with diet, lifestyle changes, and medicines. How can high cholesterol affect me? If you have high cholesterol, fatty deposits (plaques) may build up on the walls of your blood vessels. The blood vessels that carry blood away from your heart are called arteries. Plaques make the arteries narrower and stiffer. This in turn can: Restrict or block blood flow and cause blood clots to form. Increase  your risk for heart attack and stroke. What can increase my risk for high cholesterol? This condition is more likely to develop in people who: Eat foods that are high in saturated fat or cholesterol. Saturated fat is mostly found in foods that come from animal sources. Are overweight. Are not getting enough exercise. Use products that contain nicotine or tobacco, such as cigarettes, e-cigarettes, and chewing tobacco. Have a family history of high cholesterol (familial hypercholesterolemia). What actions can I take to prevent this? Nutrition  Eat less saturated fat. Avoid trans fats (partially hydrogenated oils). These are often found in margarine and in some baked goods, fried foods, and snacks bought in packages. Avoid precooked or cured meat, such as bacon, sausages, or meat loaves. Avoid foods and drinks that have added sugars. Eat more fruits, vegetables, and whole grains. Choose healthy sources of protein, such as fish, poultry, lean cuts of red meat, beans, peas, lentils, and nuts. Choose healthy sources of fat, such as: Nuts. Vegetable oils, especially olive oil. Fish that have healthy fats, such as omega-3 fatty acids. These fish include mackerel or salmon. Lifestyle Lose weight if you are overweight. Maintaining a healthy body mass index (BMI) can help prevent or control high cholesterol. It can also lower your risk for diabetes and high blood pressure. Ask your health care provider to help you with a diet and exercise plan to lose weight safely. Do not use any products that contain nicotine or tobacco. These products include cigarettes, chewing tobacco, and vaping devices, such as e-cigarettes. If you need help quitting, ask your health care provider. Alcohol  use Do not drink alcohol if: Your health care provider tells you not to drink. You are pregnant, may be pregnant, or are planning to become pregnant. If you drink alcohol: Limit how much you have to: 0-1 drink a day for  women. 0-2 drinks a day for men. Know how much alcohol is in your drink. In the [place] one drink equals one 12 oz bottle of beer (355 mL), one 5 oz glass of wine (148 mL), or one 1 oz glass of hard liquor (44 mL). Activity  Get enough exercise. Do exercises as told by your health care provider. Each week, do at least 150 minutes of exercise that takes a medium level of effort (moderate-intensity exercise). This kind of exercise: Makes your heart beat faster while allowing you to still be able to talk. Can be done in short sessions several times a day or longer sessions a few times a week. For example, on 5 days each week, you could walk fast or ride your bike 3 times a day for 10 minutes each time. Medicines Your health care provider may recommend medicines to help lower cholesterol. This may be a medicine to lower the amount of cholesterol that your liver makes. You may need medicine if: Diet and lifestyle changes have not lowered your cholesterol enough. You have high cholesterol and other risk factors for heart disease or stroke. Take over-the-counter and prescription medicines only as told by your health care provider. General information Manage your risk factors for high cholesterol. Talk with your health care provider about all your risk factors and how to lower your risk. Manage other conditions that you have, such as diabetes or high blood pressure (hypertension). Have blood tests to check your cholesterol levels at regular points in time as told by your health care provider. Keep all follow-up visits. This is important. Where to find more information American Heart Association: www.heart.org National Heart, Lung, and Blood Institute: [employer].is Summary High cholesterol increases your risk for heart disease and stroke. By keeping your cholesterol level low, you can reduce your risk for these conditions. High cholesterol can often be prevented with diet and lifestyle  changes. Work with your health care provider to manage your risk factors, and have your blood tested regularly. This information is not intended to replace advice given to you by your health care provider. Make sure you discuss any questions you have with your health care provider. Document Revised: 02/10/2022 Document Reviewed: 09/13/2020 Elsevier Patient Education  2023 [employer].

## 2022-11-06 DIAGNOSIS — M79676 Pain in unspecified toe(s): Secondary | ICD-10-CM

## 2022-11-08 ENCOUNTER — Ambulatory Visit: Payer: Medicare PPO | Admitting: Nurse Practitioner

## 2022-11-08 ENCOUNTER — Encounter: Payer: Self-pay | Admitting: Nurse Practitioner

## 2022-11-08 VITALS — BP 115/74 | HR 50 | Temp 97.9°F | Ht 65.98 in | Wt 207.1 lb

## 2022-11-08 DIAGNOSIS — E6609 Other obesity due to excess calories: Secondary | ICD-10-CM | POA: Diagnosis not present

## 2022-11-08 DIAGNOSIS — E538 Deficiency of other specified B group vitamins: Secondary | ICD-10-CM | POA: Diagnosis not present

## 2022-11-08 DIAGNOSIS — I700 Atherosclerosis of aorta: Secondary | ICD-10-CM

## 2022-11-08 DIAGNOSIS — E559 Vitamin D deficiency, unspecified: Secondary | ICD-10-CM | POA: Diagnosis not present

## 2022-11-08 DIAGNOSIS — F4323 Adjustment disorder with mixed anxiety and depressed mood: Secondary | ICD-10-CM | POA: Diagnosis not present

## 2022-11-08 DIAGNOSIS — G2581 Restless legs syndrome: Secondary | ICD-10-CM

## 2022-11-08 DIAGNOSIS — E782 Mixed hyperlipidemia: Secondary | ICD-10-CM | POA: Diagnosis not present

## 2022-11-08 DIAGNOSIS — M85831 Other specified disorders of bone density and structure, right forearm: Secondary | ICD-10-CM

## 2022-11-08 DIAGNOSIS — M791 Myalgia, unspecified site: Secondary | ICD-10-CM

## 2022-11-08 DIAGNOSIS — Z6834 Body mass index (BMI) 34.0-34.9, adult: Secondary | ICD-10-CM

## 2022-11-08 DIAGNOSIS — T466X5A Adverse effect of antihyperlipidemic and antiarteriosclerotic drugs, initial encounter: Secondary | ICD-10-CM

## 2022-11-08 NOTE — Assessment & Plan Note (Signed)
Chronic, ongoing, recheck level today and continue current supplement. May need to increase supplement dosing if remains <30. 

## 2022-11-08 NOTE — Assessment & Plan Note (Signed)
Chronic.  Noted on x-ray imaging 11/15/21.  Recommend she continue statin as ordered and take daily Baby ASA for prevention. 

## 2022-11-08 NOTE — Assessment & Plan Note (Signed)
History of trial on various statins with side effect of myalgia, continue Zetia and tolerating Crestor at three days a week at this time.  Continue this and if any adverse effects will consider change to Repatha. 

## 2022-11-08 NOTE — Assessment & Plan Note (Signed)
BMI 33.44.  Recommended eating smaller high protein, low fat meals more frequently and exercising 30 mins a day 5 times a week with a goal of 10-15lb weight loss in the next 3 months. Patient voiced their understanding and motivation to adhere to these recommendations. °

## 2022-11-08 NOTE — Progress Notes (Signed)
BP 115/74   Pulse (!) 50   Temp 97.9 F (36.6 C) (Oral)   Ht 5' 5.98" (1.676 m)   Wt 207 lb 1.6 oz (93.9 kg)   LMP  (LMP Unknown)   SpO2 97%   BMI 33.44 kg/m    Subjective:    Patient ID: Patricia Ferguson, female    DOB: 10-04-1950, 72 y.o.   MRN: [id number]  HPI: Patricia Ferguson is a 72 y.o. female  Chief Complaint  Patient presents with   Hypertension   Hyperlipidemia   Depression   HYPERLIPIDEMIA Continues to tolerate Zetia and Crestor 3 days a week.  History of intolerance to statin therapy with muscle pain. Hyperlipidemia status: good compliance Satisfied with current treatment?  yes Side effects:  no Medication compliance: good compliance Supplements: none Aspirin:  no The 10-year ASCVD risk score (Arnett DK, et al., 2019) is: 7.2%   Values used to calculate the score:     Age: 89 years     Sex: Female     Is Non-Hispanic African American: No     Diabetic: No     Tobacco smoker: No     Systolic Blood Pressure: 115 mmHg     Is BP treated: No     HDL Cholesterol: 51 mg/dL     Total Cholesterol: 149 mg/dL  Chest pain:  no Coronary artery disease:  no Family history CAD:  no Family history early CAD:  no   RLS Taking Gabapentin 100 MG QHS and Requip + B12 and Vit D for history of low levels -- is also using mustard.  Did see vascular in past on 12/12/21. Duration: chronic Pain: yes Location: lower legs Bilateral:  no Onset: gradual  Frequency: at least once a week Time of  day:  occurs mostly in the evening and night Sudden unintentional leg jerking:   no Paresthesias:  no Decreased sensation:  no Weakness:   no Insomnia:   no Fatigue:   no Alleviating factors: walking sometimes help Aggravating factors: unknown Status: improvement Treatments attempted: Magnesium + Requip + iron + B12 + mustard  OSTEOPENIA Taking Vitamin D daily.  DEXA 05/23/22 continued to note osteopenia with improvement from previous. Satisfied with current treatment?:  yes Adequate calcium & vitamin D: yes Weight bearing exercises: yes   DEPRESSION/ANXIETY Husband is alcoholic and this is major stressor for her.  He has been 6 months alcohol free -- started going to AA. Mood status: controlled Symptom severity: improved Psychotherapy/counseling: yes  Previous psychiatric medications: none Depressed mood: no Anxious mood: no Anhedonia: no Significant weight loss or gain: no Insomnia: no Fatigue: no  Feelings of worthlessness or guilt:  no Impaired concentration/indecisiveness: no Suicidal ideations: no  Hopelessness: no Crying spells: no    11/08/2022    8:21 AM 05/09/2022    8:17 AM 03/20/2022   11:11 AM 03/14/2022    8:42 AM 11/11/2021    8:07 AM  Depression screen PHQ 2/9  Decreased Interest 0 0 Down, Depressed, Hopeless 0 PHQ - 2 Score 0 Altered sleeping 0 Tired, decreased energy 0 0 1 0 0  Change in appetite 0 1 0 2 1  Feeling bad or failure about yourself  0 1 0 2 1  Trouble concentrating 0 0 0 0 0  Moving slowly or fidgety/restless 0 0 0 0 0  Suicidal thoughts 0 0 0  0 1  PHQ-9 Score 0 Difficult doing work/chores Not difficult at all Not difficult at all Somewhat difficult Not difficult at all        11/08/2022    8:21 AM 05/09/2022    8:18 AM 03/14/2022    8:42 AM 11/11/2021    8:07 AM  GAD 7 : Generalized Anxiety Score  Nervous, Anxious, on Edge 0 Control/stop worrying Worry too much - different things 1 0 1   Trouble relaxing 0 1 2 0  Restless 0 1 1 0  Easily annoyed or irritable 0 0 0 0  Afraid - awful might happen 0 1 0 1  Total GAD 7 Score Anxiety Difficulty Not difficult at all Not difficult at all Not difficult at all Somewhat difficult      Relevant past medical, surgical, family and social history reviewed and updated as indicated. Interim medical history since our last visit reviewed. Allergies and medications reviewed and updated.  Review of  Systems  Constitutional:  Negative for activity change, appetite change, diaphoresis, fatigue and fever.  Respiratory:  Negative for cough, chest tightness and shortness of breath.   Cardiovascular:  Negative for chest pain, palpitations and leg swelling.  Gastrointestinal: Negative.   Neurological: Negative.   Psychiatric/Behavioral: Negative.     Per HPI unless specifically indicated above     Objective:    BP 115/74   Pulse (!) 50   Temp 97.9 F (36.6 C) (Oral)   Ht 5' 5.98" (1.676 m)   Wt 207 lb 1.6 oz (93.9 kg)   LMP  (LMP Unknown)   SpO2 97%   BMI 33.44 kg/m   Wt Readings from Last 3 Encounters:  11/08/22 207 lb 1.6 oz (93.9 kg)  06/02/22 199 lb 7.2 oz (90.5 kg)  05/09/22 201 lb 1.6 oz (91.2 kg)    Physical Exam Vitals and nursing note reviewed.  Constitutional:      General: She is awake.     Appearance: Normal appearance. She is well-developed and well-groomed. She is not ill-appearing.  HENT:     Head: Normocephalic.     Right Ear: Hearing normal.     Left Ear: Hearing normal.     Nose: Nose normal.     Mouth/Throat:     Mouth: Mucous membranes are moist.  Eyes:     General: Lids are normal. No scleral icterus.       Right eye: No discharge.        Left eye: No discharge.     Conjunctiva/sclera: Conjunctivae normal.     Pupils: Pupils are equal, round, and reactive to light.  Neck:     Thyroid: No thyromegaly.     Vascular: No carotid bruit or JVD.  Cardiovascular:     Rate and Rhythm: Normal rate and regular rhythm.     Pulses: Normal pulses.          Dorsalis pedis pulses are 2+ on the right side and 2+ on the left side.       Posterior tibial pulses are 2+ on the right side and 2+ on the left side.     Heart sounds: Normal heart sounds. No murmur heard.    No gallop.     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 Pulmonary:     Effort: Pulmonary effort is normal. No accessory muscle usage or respiratory distress.  Breath sounds: Normal  breath sounds. No wheezing or rhonchi.  Abdominal:     General: Bowel sounds are normal.     Palpations: Abdomen is soft. There is no hepatomegaly or splenomegaly.  Musculoskeletal:        General: No swelling, tenderness or signs of injury.     Cervical back: Full passive range of motion without pain, normal range of motion and neck supple.     Right lower leg: No edema.     Left lower leg: No edema.  Lymphadenopathy:     Cervical: No cervical adenopathy.  Skin:    General: Skin is warm and dry.     Capillary Refill: Capillary refill takes less than 2 seconds.  Neurological:     Mental Status: She is alert and oriented to person, place, and time.     Motor: No weakness.  Psychiatric:        Attention and Perception: Attention normal.        Mood and Affect: Mood normal. Mood is not anxious or depressed.        Speech: Speech normal.        Behavior: Behavior normal. Behavior is cooperative.        Thought Content: Thought content normal.        Judgment: Judgment normal.     Results for orders placed or performed during the hospital encounter of 06/02/22  Surgical pathology  Result Value Ref Range   SURGICAL PATHOLOGY      SURGICAL PATHOLOGY CASE: [id number] PATIENT: [facility] Surgical Pathology Report     Specimen Submitted: A. Colon polyp, sigmoid; cold snare  Clinical History: Screening colonoscopy.    DIAGNOSIS: A. COLON POLYP, SIGMOID; COLD SNARE: - MINUTE TUBULAR ADENOMA - NEGATIVE FOR HIGH-GRADE DYSPLASIA AND MALIGNANCY.  Comment: Multiple additional deeper recut levels were examined.  GROSS DESCRIPTION: A. Labeled: Cold snare sigmoid colon polyp Received: Formalin Collection time: 10:58 AM on 06/02/2022 Placed into formalin time: 10:58 AM on 06/02/2022 Tissue fragment(s): 1 Size: 1.5 x 0.4 x 0.1 cm Description: Received is a fragment of tan elongated soft tissue.  The resection margin is inked green and the fragment is serially  sectioned. Entirely submitted in 1 cassette.  RB 06/02/2022  Final Diagnosis performed by Katherine Mantle, MD.   Electronically signed 06/05/2022 2:08:21PM The electronic signature indicates that the named Attending Pathologist has  evaluated the specimen Technical component performed at [facility], [address] [place], [place] [address] Lab: [phone] Dir: Samier Jaco Schimke, MD, MMM  Professional component performed at [facility], [facility], [address] [place], [place], [place] [address] Lab: [phone] Dir: Beryle Quant, MD       Assessment & Plan:   Problem List Items Addressed This Visit       Cardiovascular and Mediastinum   Aortic atherosclerosis - Primary    Chronic.  Noted on x-ray imaging 11/15/21.  Recommend she continue statin as ordered and take daily Baby ASA for prevention.      Relevant Orders   Comprehensive metabolic panel   Lipid Panel w/o Chol/HDL Ratio     Musculoskeletal and Integument   Osteopenia    Chronic.  On holiday from [place], started January 2022.  DEXA in 2019 and 2014 noted osteopenia.  Repeat 05/13/22 noted osteopenia with improvement in levels.  Vitamin D level today.      Relevant Orders   VITAMIN D 25 Hydroxy (Vit-D Deficiency, Fractures)     Other   Hyperlipidemia  Chronic, ongoing with poor statin tolerance in past. Continue current medication regimen, Zetia and Crestor as is tolerating this on 3 day a week regimen.  Could consider discontinuation of Zetia in future if improved levels.  Lipid panel and CMP today.      Relevant Orders   Comprehensive metabolic panel   Lipid Panel w/o Chol/HDL Ratio   Myalgia due to statin    History of trial on various statins with side effect of myalgia, continue Zetia and tolerating Crestor at three days a week at this time.  Continue this and if any adverse effects will consider change to Repatha.      Obesity    BMI 33.44.  Recommended eating smaller high protein, low fat meals  more frequently and exercising 30 mins a day 5 times a week with a goal of 10-15lb weight loss in the next 3 months. Patient voiced their understanding and motivation to adhere to these recommendations.       Restless leg syndrome    Chronic, ongoing.   - Continue Gabapentin and Requip. - Continue Magnesium and B12 supplements + iron - She is trying mustard with benefit, continue this. - Could consider period of physical therapy or massage therapy if ongoing.  Recommend she wear compression hose on during day and off at night.      Situational mixed anxiety and depressive disorder    Ongoing, stable at this time.  Does not wish to start medication at this time.  Overall stable at this time and husband currently is 6 months alcohol free.      Vitamin B 12 deficiency    Chronic, ongoing.  Noted on labs initially 09/14/21, level 199.  Continue supplement and recheck at physical.      Vitamin D deficiency    Chronic, ongoing, recheck level today and continue current supplement. May need to increase supplement dosing if remains <30.      Relevant Orders   VITAMIN D 25 Hydroxy (Vit-D Deficiency, Fractures)     Follow up plan: Return in about 6 months (around 05/11/2023) for Annual physical due after 05/10/23.

## 2022-11-08 NOTE — Assessment & Plan Note (Signed)
Chronic, ongoing.   - Continue Gabapentin and Requip. - Continue Magnesium and B12 supplements + iron - She is trying mustard with benefit, continue this. - Could consider period of physical therapy or massage therapy if ongoing.  Recommend she wear compression hose on during day and off at night.

## 2022-11-08 NOTE — Assessment & Plan Note (Signed)
Chronic, ongoing with poor statin tolerance in past. Continue current medication regimen, Zetia and Crestor as is tolerating this on 3 day a week regimen.  Could consider discontinuation of Zetia in future if improved levels.  Lipid panel and CMP today. 

## 2022-11-08 NOTE — Assessment & Plan Note (Signed)
Chronic, ongoing.  Noted on labs initially 09/14/21, level 199.  Continue supplement and recheck at physical.

## 2022-11-08 NOTE — Assessment & Plan Note (Addendum)
Ongoing, stable at this time.  Does not wish to start medication at this time.  Overall stable at this time and husband currently is 6 months alcohol free.

## 2022-11-08 NOTE — Assessment & Plan Note (Signed)
Chronic.  On holiday from [place], started January 2022.  DEXA in 2019 and 2014 noted osteopenia.  Repeat 05/13/22 noted osteopenia with improvement in levels.  Vitamin D level today.

## 2022-11-09 LAB — COMPREHENSIVE METABOLIC PANEL
ALT: 18 IU/L (ref 0–32)
AST: 23 IU/L (ref 0–40)
Albumin/Globulin Ratio: 2 (ref 1.2–2.2)
Albumin: 4 g/dL (ref 3.9–4.9)
Alkaline Phosphatase: 68 IU/L (ref 44–121)
BUN/Creatinine Ratio: 15 (ref 12–28)
BUN: 11 mg/dL (ref 8–27)
Bilirubin Total: 1.1 mg/dL (ref 0.0–1.2)
CO2: 25 mmol/L (ref 20–29)
Calcium: 8.7 mg/dL (ref 8.7–10.3)
Chloride: 106 mmol/L (ref 96–106)
Creatinine, Ser: 0.71 mg/dL (ref 0.57–1.00)
Globulin, Total: 2 g/dL (ref 1.5–4.5)
Glucose: 83 mg/dL (ref 70–99)
Potassium: 4 mmol/L (ref 3.5–5.2)
Sodium: 143 mmol/L (ref 134–144)
Total Protein: 6 g/dL (ref 6.0–8.5)
eGFR: 91 mL/min/{1.73_m2} (ref >59)

## 2022-11-09 LAB — LIPID PANEL W/O CHOL/HDL RATIO
Cholesterol, Total: 152 mg/dL (ref 100–199)
HDL: 51 mg/dL (ref >39)
LDL Chol Calc (NIH): 86 mg/dL (ref 0–99)
Triglycerides: 80 mg/dL (ref 0–149)
VLDL Cholesterol Cal: 15 mg/dL (ref 5–40)

## 2022-11-09 LAB — VITAMIN D 25 HYDROXY (VIT D DEFICIENCY, FRACTURES): Vit D, 25-Hydroxy: 35.3 ng/mL (ref 30.0–100.0)

## 2022-11-09 NOTE — Progress Notes (Signed)
Contacted via MyChart   Good afternoon Patricia Ferguson, your labs have returned and overall these are very stable.  No medication changes needed.  Great news!!  Any questions? Keep being amazing!!  Thank you for allowing me to participate in your care.  I appreciate you. Kindest regards, Aldeen Riga

## 2022-12-04 DIAGNOSIS — Z0100 Encounter for examination of eyes and vision without abnormal findings: Secondary | ICD-10-CM | POA: Diagnosis not present

## 2022-12-04 DIAGNOSIS — H2513 Age-related nuclear cataract, bilateral: Secondary | ICD-10-CM | POA: Diagnosis not present

## 2022-12-11 DIAGNOSIS — B9689 Other specified bacterial agents as the cause of diseases classified elsewhere: Secondary | ICD-10-CM | POA: Diagnosis not present

## 2022-12-11 DIAGNOSIS — J019 Acute sinusitis, unspecified: Secondary | ICD-10-CM | POA: Diagnosis not present

## 2022-12-11 DIAGNOSIS — Z03818 Encounter for observation for suspected exposure to other biological agents ruled out: Secondary | ICD-10-CM | POA: Diagnosis not present

## 2022-12-20 ENCOUNTER — Telehealth: Payer: Self-pay | Admitting: Urology

## 2022-12-20 NOTE — Telephone Encounter (Signed)
DOS - 01/18/23  [facility] REPAIR 2-4 RIGHT --- [address] CORTISONE INJECTION 2ND LEFT --- [address]  [facility]   PER [facility] WEBSITE FOR CPT CODE 60454 NO PRIOR AUTH IS REQUIRED. FOR CPT CODE 09811 HAS BEEN APPROVED, AUTH # [id number], GOOD FROM 01/18/23 - 03/20/23.  TRACKING # [id number]

## 2022-12-25 ENCOUNTER — Ambulatory Visit: Payer: Self-pay | Admitting: *Deleted

## 2022-12-25 ENCOUNTER — Ambulatory Visit: Payer: Medicare PPO | Admitting: Nurse Practitioner

## 2022-12-25 ENCOUNTER — Encounter: Payer: Self-pay | Admitting: Nurse Practitioner

## 2022-12-25 VITALS — BP 112/60 | HR 60 | Temp 98.2°F | Ht 65.98 in | Wt 202.2 lb

## 2022-12-25 DIAGNOSIS — R319 Hematuria, unspecified: Secondary | ICD-10-CM | POA: Diagnosis not present

## 2022-12-25 DIAGNOSIS — N3001 Acute cystitis with hematuria: Secondary | ICD-10-CM

## 2022-12-25 LAB — URINALYSIS, ROUTINE W REFLEX MICROSCOPIC
Bilirubin, UA: NEGATIVE
Glucose, UA: NEGATIVE
Ketones, UA: NEGATIVE
Nitrite, UA: NEGATIVE
Protein,UA: NEGATIVE
Specific Gravity, UA: <1.005 — ABNORMAL LOW (ref 1.005–1.030)
Urobilinogen, Ur: 0.2 mg/dL (ref 0.2–1.0)
pH, UA: 5.5 (ref 5.0–7.5)

## 2022-12-25 LAB — MICROSCOPIC EXAMINATION

## 2022-12-25 LAB — WET PREP FOR TRICH, YEAST, CLUE
Clue Cell Exam: NEGATIVE
Trichomonas Exam: NEGATIVE
Yeast Exam: NEGATIVE

## 2022-12-25 MED ORDER — NITROFURANTOIN MONOHYD MACRO 100 MG PO CAPS: 100.0000 mg | ORAL_CAPSULE | Freq: Two times a day (BID) | ORAL | 0 refills | Status: DC

## 2022-12-25 NOTE — Telephone Encounter (Signed)
  Chief Complaint: blood in urine and burning  Symptoms: noted blood on tissue after wiping "like period blood". No clots burning sensation with urinating.  Frequency: this am  Pertinent Negatives: Patient denies fever. No abdominal pain no back or flank pain Disposition: [] ED /[] Urgent Care (no appt availability in office) / [x] Appointment(In office/virtual)/ []  [place] [facility]/ [] Home Care/ [] Refused Recommended Disposition /[] [place] [facility]/ []  Follow-up with PCP Additional Notes:   Appt scheduled today .   Reason for Disposition  [1] Pain or burning with passing urine AND [2] side (flank) or back pain present  Answer Assessment - Initial Assessment Questions 1. COLOR of URINE: "Describe the color of the urine."  (e.g., tea-colored, pink, red, bloody) "Do you have blood clots in your urine?" (e.g., none, pea, grape, small coin)     Blood with wiping  2. ONSET: "When did the bleeding start?"      This am  3. EPISODES: "How many times has there been blood in the urine?" or "How many times today?"     1 time  4. PAIN with URINATION: "Is there any pain with passing your urine?" If Yes, ask: "How bad is the pain?"  (Scale 1-10; or mild, moderate, severe)    - MILD: Complains slightly about urination hurting.    - MODERATE: Interferes with normal activities.      - SEVERE: Excruciating, unwilling or unable to urinate because of the pain.      Burning sensation 5. FEVER: "Do you have a fever?" If Yes, ask: "What is your temperature, how was it measured, and when did it start?"     na 6. ASSOCIATED SYMPTOMS: "Are you passing urine more frequently than usual?"     No  7. OTHER SYMPTOMS: "Do you have any other symptoms?" (e.g., back/flank pain, abdomen pain, vomiting)     Only blood after wiping when urinated and burning sensation with urinating  8. PREGNANCY: "Is there any chance you are pregnant?" "When was your last menstrual period?"     na  Protocols used: Urine -  Blood In-A-AH

## 2022-12-25 NOTE — Progress Notes (Signed)
BP 112/60   Pulse 60   Temp 98.2 F (36.8 C)   Ht 5' 5.98" (1.676 m)   Wt 202 lb 3.2 oz (91.7 kg)   LMP  (LMP Unknown)   SpO2 97%   BMI 32.65 kg/m    Subjective:    Patient ID: Patricia Ferguson, female    DOB: Nov 14, 1950, 72 y.o.   MRN: [id number]  HPI: Patricia Ferguson is a 72 y.o. female  Chief Complaint  Patient presents with   Hematuria    With burning sensation started this morning   URINARY SYMPTOMS Symptoms started this morning Dysuria: yes Urinary frequency: no Urgency: no Small volume voids: no Symptom severity: no Urinary incontinence: no Foul odor: no Hematuria: yes Abdominal pain: no Back pain: no Suprapubic pain/pressure: yes Flank pain: no Fever:  no Vomiting: no Relief with cranberry juice: no Relief with pyridium: no Status: stable Previous urinary tract infection: no Recurrent urinary tract infection: no Sexual activity: No sexually active/monogomous/practicing safe sex History of sexually transmitted disease: no Penile discharge: no Treatments attempted: increasing fluids   Relevant past medical, surgical, family and social history reviewed and updated as indicated. Interim medical history since our last visit reviewed. Allergies and medications reviewed and updated.  Review of Systems  Constitutional:  Negative for fever.  Gastrointestinal:  Negative for abdominal pain and vomiting.  Genitourinary:  Positive for dysuria and hematuria. Negative for decreased urine volume, flank pain, frequency and urgency.  Musculoskeletal:  Negative for back pain.    Per HPI unless specifically indicated above     Objective:    BP 112/60   Pulse 60   Temp 98.2 F (36.8 C)   Ht 5' 5.98" (1.676 m)   Wt 202 lb 3.2 oz (91.7 kg)   LMP  (LMP Unknown)   SpO2 97%   BMI 32.65 kg/m   Wt Readings from Last 3 Encounters:  12/25/22 202 lb 3.2 oz (91.7 kg)  11/08/22 207 lb 1.6 oz (93.9 kg)  06/02/22 199 lb 7.2 oz (90.5 kg)    Physical Exam Vitals and  nursing note reviewed.  Constitutional:      General: She is not in acute distress.    Appearance: Normal appearance. She is normal weight. She is not ill-appearing, toxic-appearing or diaphoretic.  HENT:     Head: Normocephalic.     Right Ear: External ear normal.     Left Ear: External ear normal.     Nose: Nose normal.     Mouth/Throat:     Mouth: Mucous membranes are moist.     Pharynx: Oropharynx is clear.  Eyes:     General:        Right eye: No discharge.        Left eye: No discharge.     Extraocular Movements: Extraocular movements intact.     Conjunctiva/sclera: Conjunctivae normal.     Pupils: Pupils are equal, round, and reactive to light.  Cardiovascular:     Rate and Rhythm: Normal rate and regular rhythm.     Heart sounds: No murmur heard. Pulmonary:     Effort: Pulmonary effort is normal. No respiratory distress.     Breath sounds: Normal breath sounds. No wheezing or rales.  Abdominal:     General: Abdomen is flat. Bowel sounds are normal. There is no distension.     Palpations: Abdomen is soft.     Tenderness: There is no abdominal tenderness. There is no right CVA tenderness, left CVA  tenderness or guarding.  Musculoskeletal:     Cervical back: Normal range of motion and neck supple.  Skin:    General: Skin is warm and dry.     Capillary Refill: Capillary refill takes less than 2 seconds.  Neurological:     General: No focal deficit present.     Mental Status: She is alert and oriented to person, place, and time. Mental status is at baseline.  Psychiatric:        Mood and Affect: Mood normal.        Behavior: Behavior normal.        Thought Content: Thought content normal.        Judgment: Judgment normal.     Results for orders placed or performed in visit on 11/08/22  Comprehensive metabolic panel  Result Value Ref Range   Glucose 83 70 - 99 mg/dL   BUN 11 8 - 27 mg/dL   Creatinine, Ser 2.13 0.57 - 1.00 mg/dL   eGFR 91 >08 MV/HQI/6.96    BUN/Creatinine Ratio 15 12 - 28   Sodium 143 134 - 144 mmol/L   Potassium 4.0 3.5 - 5.2 mmol/L   Chloride 106 96 - 106 mmol/L   CO2 25 20 - 29 mmol/L   Calcium 8.7 8.7 - 10.3 mg/dL   Total Protein 6.0 6.0 - 8.5 g/dL   Albumin 4.0 3.9 - 4.9 g/dL   Globulin, Total 2.0 1.5 - 4.5 g/dL   Albumin/Globulin Ratio 2.0 1.2 - 2.2   Bilirubin Total 1.1 0.0 - 1.2 mg/dL   Alkaline Phosphatase 68 44 - 121 IU/L   AST 23 0 - 40 IU/L   ALT 18 0 - 32 IU/L  Lipid Panel w/o Chol/HDL Ratio  Result Value Ref Range   Cholesterol, Total 152 100 - 199 mg/dL   Triglycerides 80 0 - 149 mg/dL   HDL 51 >29 mg/dL   VLDL Cholesterol Cal 15 5 - 40 mg/dL   LDL Chol Calc (NIH) 86 0 - 99 mg/dL  VITAMIN D 25 Hydroxy (Vit-D Deficiency, Fractures)  Result Value Ref Range   Vit D, 25-Hydroxy 35.3 30.0 - 100.0 ng/mL      Assessment & Plan:   Problem List Items Addressed This Visit   None Visit Diagnoses     Acute cystitis with hematuria    -  Primary   Will treat with macrobid x 5 days.  Will send urine for culture.  Recommend increasing hydration.  Follow up if not improved.   Relevant Orders   Urine Culture   Hematuria, unspecified type       Relevant Orders   Urinalysis, Routine w reflex microscopic   WET PREP FOR TRICH, YEAST, CLUE        Follow up plan: No follow-ups on file.

## 2022-12-26 NOTE — Progress Notes (Signed)
Results discussed with patient during visit.

## 2022-12-27 LAB — URINE CULTURE

## 2022-12-27 NOTE — Progress Notes (Signed)
Hi Ms. Mcclarren.  Your urine did not grow any bacteria.  You can stop the antibiotics. If your symptoms are persistent please come back and see [place].

## 2023-01-08 ENCOUNTER — Encounter: Payer: Self-pay | Admitting: Nurse Practitioner

## 2023-01-10 DIAGNOSIS — R319 Hematuria, unspecified: Secondary | ICD-10-CM | POA: Diagnosis not present

## 2023-01-10 DIAGNOSIS — R300 Dysuria: Secondary | ICD-10-CM | POA: Diagnosis not present

## 2023-01-16 ENCOUNTER — Telehealth: Payer: Self-pay | Admitting: Podiatry

## 2023-01-16 NOTE — Telephone Encounter (Signed)
CALLED PT IN REGARDS TO PREVIOUS PHONE CALL REGARDING ANTIBIOTICS. ADVISED PT THAT SINCE SHE HAD A UTI AND WOULD NOT BE ABLE TO TEST PRIOR TO SX, IT WOULD BE BEST TO RESCHEDULE. PT WAS OK WITH RESCHEDULING TO 02/01/23.

## 2023-01-17 ENCOUNTER — Ambulatory Visit: Payer: Medicare PPO | Admitting: Nurse Practitioner

## 2023-01-23 ENCOUNTER — Encounter: Payer: Medicare PPO | Admitting: Podiatry

## 2023-01-30 ENCOUNTER — Encounter: Payer: Medicare PPO | Admitting: Podiatry

## 2023-02-01 ENCOUNTER — Other Ambulatory Visit: Payer: Self-pay | Admitting: Podiatry

## 2023-02-01 ENCOUNTER — Encounter: Payer: Self-pay | Admitting: *Deleted

## 2023-02-01 DIAGNOSIS — M2041 Other hammer toe(s) (acquired), right foot: Secondary | ICD-10-CM | POA: Diagnosis not present

## 2023-02-01 DIAGNOSIS — M7752 Other enthesopathy of left foot: Secondary | ICD-10-CM | POA: Diagnosis not present

## 2023-02-01 DIAGNOSIS — H25812 Combined forms of age-related cataract, left eye: Secondary | ICD-10-CM | POA: Diagnosis not present

## 2023-02-01 DIAGNOSIS — M24574 Contracture, right foot: Secondary | ICD-10-CM | POA: Diagnosis not present

## 2023-02-01 DIAGNOSIS — M7751 Other enthesopathy of right foot: Secondary | ICD-10-CM | POA: Diagnosis not present

## 2023-02-01 HISTORY — PX: HAMMER TOE SURGERY: SHX385

## 2023-02-01 MED ORDER — MELOXICAM 15 MG PO TABS: 15.0000 mg | ORAL_TABLET | Freq: Every day | ORAL | 1 refills | Status: DC

## 2023-02-01 MED ORDER — HYDROCODONE-ACETAMINOPHEN 10-325 MG PO TABS: 1.0000 | ORAL_TABLET | ORAL | 0 refills | Status: AC | PRN

## 2023-02-01 NOTE — Progress Notes (Signed)
PRN postop 

## 2023-02-09 ENCOUNTER — Ambulatory Visit (INDEPENDENT_AMBULATORY_CARE_PROVIDER_SITE_OTHER): Payer: Medicare PPO

## 2023-02-09 ENCOUNTER — Ambulatory Visit (INDEPENDENT_AMBULATORY_CARE_PROVIDER_SITE_OTHER): Payer: Medicare PPO | Admitting: Podiatry

## 2023-02-09 ENCOUNTER — Encounter: Payer: Self-pay | Admitting: Podiatry

## 2023-02-09 DIAGNOSIS — Z9889 Other specified postprocedural states: Secondary | ICD-10-CM

## 2023-02-13 ENCOUNTER — Encounter: Payer: Medicare PPO | Admitting: Podiatry

## 2023-02-16 ENCOUNTER — Ambulatory Visit (INDEPENDENT_AMBULATORY_CARE_PROVIDER_SITE_OTHER): Payer: Medicare PPO | Admitting: Podiatry

## 2023-02-16 ENCOUNTER — Encounter: Payer: Self-pay | Admitting: Podiatry

## 2023-02-16 DIAGNOSIS — Z9889 Other specified postprocedural states: Secondary | ICD-10-CM

## 2023-02-16 NOTE — Progress Notes (Signed)
   Chief Complaint  Patient presents with   Routine Post Op    "It feel pretty good, every now and then, a pain shoots through it."    Subjective:  Patient presents today status post hammertoe repair digits 2, 3, 4 of the left foot.  DOS: 01/18/2023.  Patient doing very well.  WBAT in the cam boot as instructed.  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 SCREWS IN BACK   bone spur     BREAST BIOPSY Right 1980's   BREAST SURGERY     CERVICAL CONE BIOPSY     COLONOSCOPY WITH PROPOFOL N/A 06/02/2022   Procedure: COLONOSCOPY WITH PROPOFOL;  Surgeon: Wyline Mood, MD;  Location: [facility] ENDOSCOPY;  Service: Gastroenterology;  Laterality: N/A;   lumbar fission     MASS EXCISION Left 01/07/2020   Procedure: EXCISION MASS (RESECTION OF ANTECUBITAL VENOUS ANEURYSM);  Surgeon: Renford Dills, MD;  Location: [facility] ORS;  Service: Vascular;  Laterality: Left;    Allergies  Allergen Reactions   Meloxicam Other (See Comments)    Face got flushed and red.   Adhesive [Tape] Rash    PAPER TAPE OKAY   Lipitor [Atorvastatin] Other (See Comments)    Muscle aches   Oxycodone Rash   Percodan [Oxycodone-Aspirin] Rash   Pravachol [Pravastatin Sodium] Other (See Comments)    Muscle aches    Propoxyphene Other (See Comments)    depression    Objective/Physical Exam Neurovascular status intact.  Incision well coapted with sutures intact. No sign of infectious process noted. No dehiscence. No active bleeding noted.  Moderate edema noted to the surgical  extremity.  Radiographic Exam LT foot 02/09/2023:  Percutaneous fixation pins noted within the second third and fourth rays of the foot.  Good alignment of the toes.  Stable surgical foot radiographically  Assessment: 1. s/p hammertoe repair digits 2, 3, 4 left foot.  DOS: 02/01/2023   Plan of Care:  -Patient was evaluated. X-rays reviewed that were taken 02/09/2023 - Sutures removed -Continue WBAT cam boot -Return to clinic 2 weeks for percutaneous fixation pin removal and follow-up x-rays   Felecia Shelling, DPM [facility]  Dr. Felecia Shelling, DPM    [address] [place], [place] [address]                Office [phone]  Fax [phone]

## 2023-02-19 NOTE — Progress Notes (Signed)
   Chief Complaint  Patient presents with   Routine Post Op    "It's good."    Subjective:  Patient presents today status post hammertoe repair of the second third and fourth digit of the right foot.  Patient doing well.  She has been minimally weightbearing in the cam boot as instructed.  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 SCREWS IN BACK   bone spur     BREAST BIOPSY Right 1980's   BREAST SURGERY     CERVICAL CONE BIOPSY     COLONOSCOPY WITH PROPOFOL N/A 06/02/2022   Procedure: COLONOSCOPY WITH PROPOFOL;  Surgeon: Wyline Mood, MD;  Location: [facility] ENDOSCOPY;  Service: Gastroenterology;  Laterality: N/A;   lumbar fission     MASS EXCISION Left 01/07/2020   Procedure: EXCISION MASS (RESECTION OF ANTECUBITAL VENOUS ANEURYSM);  Surgeon: Renford Dills, MD;  Location: [facility] ORS;  Service: Vascular;  Laterality: Left;    Allergies  Allergen Reactions   Meloxicam Other (See Comments)    Face got flushed and red.   Adhesive [Tape] Rash    PAPER TAPE OKAY   Lipitor [Atorvastatin] Other (See Comments)    Muscle aches   Oxycodone Rash   Percodan [Oxycodone-Aspirin] Rash   Pravachol [Pravastatin Sodium] Other (See Comments)    Muscle aches    Propoxyphene Other (See Comments)    depression    Objective/Physical Exam Neurovascular status intact.  Incision well coapted with sutures intact. No sign of infectious process noted. No dehiscence. No active bleeding noted.  Moderate edema noted to the surgical extremity.  Radiographic Exam  RT foot 02/09/2023:  K wire fixation and osteotomies sites appear to be stable with routine healing.  Good alignment of the toes  Assessment: 1. s/p hammertoe repair 2, 3, 4 RT. DOS: 02/01/2023   Plan of Care:  -Patient was evaluated. X-rays reviewed - Dressings changed. -Continue WBAT cam boot -Return to clinic 1 week suture removal   *[employer] part-time 6hrs per day   Felecia Shelling, DPM [facility]  Dr. Felecia Shelling, DPM    [address] [place], [place] [address]                Office [phone]  Fax [phone]

## 2023-03-02 ENCOUNTER — Ambulatory Visit (INDEPENDENT_AMBULATORY_CARE_PROVIDER_SITE_OTHER): Payer: Medicare PPO

## 2023-03-02 ENCOUNTER — Encounter: Payer: Self-pay | Admitting: Podiatry

## 2023-03-02 ENCOUNTER — Ambulatory Visit (INDEPENDENT_AMBULATORY_CARE_PROVIDER_SITE_OTHER): Payer: Medicare PPO | Admitting: Podiatry

## 2023-03-02 ENCOUNTER — Encounter: Payer: Medicare PPO | Admitting: Podiatry

## 2023-03-02 DIAGNOSIS — M7751 Other enthesopathy of right foot: Secondary | ICD-10-CM

## 2023-03-02 DIAGNOSIS — Z9889 Other specified postprocedural states: Secondary | ICD-10-CM

## 2023-03-02 NOTE — Progress Notes (Unsigned)
   Chief Complaint  Patient presents with   Routine Post Op    POV # 3 DOS 02/01/23 -- HAMMERTOE REPAIR DIGITS 2,3,4, CORTISONE INJECTION 2ND DIGIT LEFT "It's doing good.  Every now and then, it hurts."    Subjective:  Patient presents today status post hammertoe repair digits 2, 3, 4 of the left foot.  DOS: 01/18/2023.  Patient doing very well.  WBAT in the cam boot as instructed.  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 SCREWS IN BACK   bone spur     BREAST BIOPSY Right 1980's   BREAST SURGERY     CERVICAL CONE BIOPSY     COLONOSCOPY WITH PROPOFOL N/A 06/02/2022   Procedure: COLONOSCOPY WITH PROPOFOL;  Surgeon: Wyline Mood, MD;  Location: [facility] ENDOSCOPY;  Service: Gastroenterology;  Laterality: N/A;   lumbar fission     MASS EXCISION Left 01/07/2020   Procedure: EXCISION MASS (RESECTION OF ANTECUBITAL VENOUS ANEURYSM);  Surgeon: Renford Dills, MD;  Location: [facility] ORS;  Service: Vascular;  Laterality: Left;    Allergies  Allergen Reactions   Meloxicam Other (See Comments)    Face got flushed and red.   Adhesive [Tape] Rash    PAPER TAPE OKAY   Lipitor [Atorvastatin] Other (See Comments)    Muscle aches   Oxycodone Rash   Percodan [Oxycodone-Aspirin] Rash   Pravachol [Pravastatin Sodium] Other (See Comments)    Muscle aches    Propoxyphene Other (See Comments)    depression    Objective/Physical Exam Neurovascular status intact.  Incisions nicely coapted and healed.  No erythema that would be concerning for infection.  Toes  are in good rectus alignment and percutaneous fixation pins intact.  There continues to be some edema noted to the forefoot.  Radiographic Exam LT foot 03/02/2023:  Percutaneous fixation pins noted within the second third and fourth rays of the foot.  Good alignment of the toes.  Stable surgical foot radiographically  Assessment: 1. s/p hammertoe repair digits 2, 3, 4 left foot.  DOS: 02/01/2023   Plan of Care:  -Patient was evaluated.  -Percutaneous fixation pins removed today -Patient may now begin to transition slowly out of the cam boot to good supportive tennis shoes and sneakers -Return to clinic 6 weeks follow-up x-ray   Felecia Shelling, DPM [facility]  Dr. Felecia Shelling, DPM    [address] [place], [place] [address]                Office [phone]  Fax [phone]

## 2023-03-08 ENCOUNTER — Other Ambulatory Visit: Payer: Self-pay | Admitting: Nurse Practitioner

## 2023-03-09 NOTE — Telephone Encounter (Signed)
Requested medication (s) are due for refill today: expired medication  Requested medication (s) are on the active medication list: yes  Last refill:  01/25/22 #36 4 refills  Future visit scheduled: yes in 2 months  Notes to clinic:  expired medication, protocol failed last lab 11/08/22. Do you want to renew Rx?     Requested Prescriptions  Pending Prescriptions Disp Refills   rosuvastatin (CRESTOR) 20 MG tablet [Pharmacy Med Name: ROSUVASTATIN CALCIUM 20 MG TAB] 36 tablet 4    Sig: TAKE 1 TABLET BY MOUTH THREE TIMES A WEEK (MON WEDNESDAY AND FRIDAY)     Cardiovascular:  Antilipid - Statins 2 Failed - 03/08/2023  2:09 PM      Failed - Lipid Panel in normal range within the last 12 months    Cholesterol, Total  Date Value Ref Range Status  11/08/2022 152 100 - 199 mg/dL Final   LDL Chol Calc (NIH)  Date Value Ref Range Status  11/08/2022 86 0 - 99 mg/dL Final   HDL  Date Value Ref Range Status  11/08/2022 51 >39 mg/dL Final   Triglycerides  Date Value Ref Range Status  11/08/2022 80 0 - 149 mg/dL Final         Passed - Cr in normal range and within 360 days    Creatinine, Ser  Date Value Ref Range Status  11/08/2022 0.71 0.57 - 1.00 mg/dL Final         Passed - Patient is not pregnant      Passed - Valid encounter within last 12 months    Recent Outpatient Visits           2 months ago Acute cystitis with hematuria   [place] [facility] Larae Grooms, NP   4 months ago Aortic atherosclerosis (HCC)   [place] [facility] [place], Dorie Rank, NP   10 months ago Thrombocytopenia [facility])   [place] [facility] [place], Dorie Rank, NP   11 months ago COVID-19   [facility] Larae Grooms, NP   12 months ago Restless leg syndrome   [place] [facility] [place], Dorie Rank, NP       Future Appointments             In 2 months Cannady, Dorie Rank, NP  [facility], [facility]

## 2023-04-03 ENCOUNTER — Other Ambulatory Visit (INDEPENDENT_AMBULATORY_CARE_PROVIDER_SITE_OTHER): Payer: Self-pay | Admitting: Nurse Practitioner

## 2023-04-03 DIAGNOSIS — I83893 Varicose veins of bilateral lower extremities with other complications: Secondary | ICD-10-CM

## 2023-04-05 ENCOUNTER — Ambulatory Visit (INDEPENDENT_AMBULATORY_CARE_PROVIDER_SITE_OTHER): Payer: Medicare PPO

## 2023-04-05 ENCOUNTER — Telehealth: Payer: Self-pay | Admitting: Nurse Practitioner

## 2023-04-05 DIAGNOSIS — I83893 Varicose veins of bilateral lower extremities with other complications: Secondary | ICD-10-CM

## 2023-04-05 NOTE — Telephone Encounter (Signed)
Copied from CRM [id number]. Topic: Medicare AWV >> Apr 05, 2023  3:13 PM Payton Doughty wrote: Reason for CRM: LM 04/05/2023 to schedule AWV   Verlee Rossetti; Care Guide Ambulatory Clinical Support [place] [facility] Direct Dial: [phone]

## 2023-04-08 DIAGNOSIS — I8310 Varicose veins of unspecified lower extremity with inflammation: Secondary | ICD-10-CM | POA: Insufficient documentation

## 2023-04-08 NOTE — Progress Notes (Unsigned)
MRN : [id number]  Patricia Ferguson is a 72 y.o. (04-08-1951) female who presents with chief complaint of legs hurt and swell.  History of Present Illness:   The patient returns for followup evaluation many months after the initial visit. The patient continues to have pain in the lower extremities with dependency. The pain is lessened with elevation. Graduated compression stockings, Class I (20-30 mmHg), have been worn but the stockings do not eliminate the leg pain. Over-the-counter analgesics do not improve the symptoms. The degree of discomfort continues to interfere with daily activities. The patient notes the pain in the legs is causing problems with daily exercise, at the workplace and even with household activities and maintenance such as standing in the kitchen preparing meals and doing dishes.   Venous ultrasound shows normal deep venous system, no evidence of acute or chronic DVT.  Superficial reflux is present in the ***  No outpatient medications have been marked as taking for the 04/09/23 encounter (Appointment) with Gilda Crease, Latina Craver, MD.    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 SCREWS IN BACK   bone spur     BREAST BIOPSY Right 1980's   BREAST SURGERY     CERVICAL CONE BIOPSY     COLONOSCOPY WITH PROPOFOL N/A 06/02/2022   Procedure: COLONOSCOPY WITH PROPOFOL;  Surgeon: Wyline Mood, MD;  Location: [facility] ENDOSCOPY;  Service: Gastroenterology;  Laterality: N/A;   lumbar fission     MASS EXCISION Left 01/07/2020   Procedure: EXCISION MASS  (RESECTION OF ANTECUBITAL VENOUS ANEURYSM);  Surgeon: Renford Dills, MD;  Location: [facility] ORS;  Service: Vascular;  Laterality: Left;    Social History Social History   Tobacco Use   Smoking status: Never   Smokeless tobacco: Never  Vaping Use   Vaping status: Never Used  Substance Use Topics   Alcohol use: Not Currently    Alcohol/week: 0.0 standard drinks of alcohol    Comment: on occasion   Drug use: No    Family History Family History  Problem Relation Age of Onset   Diabetes Mother    Hyperlipidemia Mother    Hypertension Mother    Osteoporosis Mother    Cancer Father        lung   Diabetes Father    Diabetes Sister    Hyperlipidemia Sister    Hypertension Sister    Hypertension Brother    Hyperlipidemia Sister    Breast cancer Neg Hx     Allergies  Allergen Reactions   Meloxicam Other (See Comments)    Face got flushed and red.   Adhesive [Tape] Rash    PAPER TAPE OKAY   Lipitor [Atorvastatin] Other (See Comments)    Muscle aches   Oxycodone Rash   Percodan [Oxycodone-Aspirin] Rash   Pravachol [Pravastatin Sodium] Other (See Comments)    Muscle aches    Propoxyphene Other (See Comments)  depression     REVIEW OF SYSTEMS (Negative unless checked)  Constitutional: [] Weight loss  [] Fever  [] Chills Cardiac: [] Chest pain   [] Chest pressure   [] Palpitations   [] Shortness of breath when laying flat   [] Shortness of breath with exertion. Vascular:  [] Pain in legs with walking   [x] Pain in legs at rest  [] History of DVT   [] Phlebitis   [x] Swelling in legs   [] Varicose veins   [] Non-healing ulcers Pulmonary:   [] Uses home oxygen   [] Productive cough   [] Hemoptysis   [] Wheeze  [] COPD   [] Asthma Neurologic:  [] Dizziness   [] Seizures   [] History of stroke   [] History of TIA  [] Aphasia   [] Vissual changes   [] Weakness or numbness in arm   [] Weakness or numbness in leg Musculoskeletal:   [] Joint swelling   [] Joint pain   [] Low back pain Hematologic:  [] Easy  bruising  [] Easy bleeding   [] Hypercoagulable state   [] Anemic Gastrointestinal:  [] Diarrhea   [] Vomiting  [] Gastroesophageal reflux/heartburn   [] Difficulty swallowing. Genitourinary:  [] Chronic kidney disease   [] Difficult urination  [] Frequent urination   [] Blood in urine Skin:  [] Rashes   [] Ulcers  Psychological:  [] History of anxiety   []  History of major depression.  Physical Examination  There were no vitals filed for this visit. There is no height or weight on file to calculate BMI. Gen: WD/WN, NAD Head: [place]/AT, No temporalis wasting.  Ear/Nose/Throat: Hearing grossly intact, nares w/o erythema or drainage, pinna without lesions Eyes: PER, EOMI, sclera nonicteric.  Neck: Supple, no gross masses.  No JVD.  Pulmonary:  Good air movement, no audible wheezing, no use of accessory muscles.  Cardiac: RRR, precordium not hyperdynamic. Vascular:  scattered varicosities present bilaterally.  Moderate venous stasis changes to the legs bilaterally.  2+ soft pitting edema. CEAP [id number]   Vessel Right Left  Radial Palpable Palpable  Gastrointestinal: soft, non-distended. No guarding/no peritoneal signs.  Musculoskeletal: M/S 5/5 throughout.  No deformity.  Neurologic: CN 2-12 intact. Pain and light touch intact in extremities.  Symmetrical.  Speech is fluent. Motor exam as listed above. Psychiatric: Judgment intact, Mood & affect appropriate for pt's clinical situation. Dermatologic: Venous rashes no ulcers noted.  No changes consistent with cellulitis. Lymph : No lichenification or skin changes of chronic lymphedema.  CBC Lab Results  Component Value Date   WBC 4.3 05/09/2022   HGB 12.5 05/09/2022   HCT 38.4 05/09/2022   MCV 91 05/09/2022   PLT 159 05/09/2022    BMET    Component Value Date/Time   NA 143 11/08/2022 0849   K 4.0 11/08/2022 0849   CL 106 11/08/2022 0849   CO2 25 11/08/2022 0849   GLUCOSE 83 11/08/2022 0849   GLUCOSE 90 01/05/2020 0818   BUN 11 11/08/2022 0849    CREATININE 0.71 11/08/2022 0849   CALCIUM 8.7 11/08/2022 0849   GFRNONAA 90 05/05/2020 0944   GFRAA 104 05/05/2020 0944   CrCl cannot be calculated (Patient's most recent lab result is older than the maximum 21 days allowed.).  COAG Lab Results  Component Value Date   INR 0.9 01/05/2020    Radiology No results found.   Assessment/Plan There are no diagnoses linked to this encounter.   Levora Dredge, MD  04/08/2023 12:40 PM

## 2023-04-09 ENCOUNTER — Encounter (INDEPENDENT_AMBULATORY_CARE_PROVIDER_SITE_OTHER): Payer: Self-pay | Admitting: Vascular Surgery

## 2023-04-09 ENCOUNTER — Ambulatory Visit (INDEPENDENT_AMBULATORY_CARE_PROVIDER_SITE_OTHER): Payer: Medicare PPO | Admitting: Vascular Surgery

## 2023-04-09 VITALS — BP 133/86 | HR 66 | Resp 18 | Ht 64.0 in | Wt 198.4 lb

## 2023-04-09 DIAGNOSIS — I8310 Varicose veins of unspecified lower extremity with inflammation: Secondary | ICD-10-CM | POA: Diagnosis not present

## 2023-04-09 DIAGNOSIS — I700 Atherosclerosis of aorta: Secondary | ICD-10-CM | POA: Diagnosis not present

## 2023-04-09 DIAGNOSIS — E782 Mixed hyperlipidemia: Secondary | ICD-10-CM | POA: Diagnosis not present

## 2023-04-10 ENCOUNTER — Encounter (INDEPENDENT_AMBULATORY_CARE_PROVIDER_SITE_OTHER): Payer: Self-pay | Admitting: Vascular Surgery

## 2023-04-20 NOTE — Progress Notes (Signed)
Outpatient Surgery on 02/01/2023 at [facility]  Hammer Toe Repair 2,3,4 right foot - [phone] Cortisone Injection 2nd digit right foot - [phone]

## 2023-05-03 ENCOUNTER — Other Ambulatory Visit: Payer: Self-pay | Admitting: Nurse Practitioner

## 2023-05-03 NOTE — Telephone Encounter (Signed)
Requested Prescriptions  Pending Prescriptions Disp Refills   gabapentin (NEURONTIN) 100 MG capsule [Pharmacy Med Name: GABAPENTIN 100 MG CAP] 180 capsule 4    Sig: Take 2 capsules (200 mg total) by mouth at bedtime.     Neurology: Anticonvulsants - gabapentin Passed - 05/03/2023  5:16 PM      Passed - Cr in normal range and within 360 days    Creatinine, Ser  Date Value Ref Range Status  11/08/2022 0.71 0.57 - 1.00 mg/dL Final         Passed - Completed PHQ-2 or PHQ-9 in the last 360 days      Passed - Valid encounter within last 12 months    Recent Outpatient Visits           4 months ago Acute cystitis with hematuria   [place] [facility] Larae Grooms, NP   5 months ago Aortic atherosclerosis [facility])   [place] [facility] [place], Dorie Rank, NP   11 months ago Thrombocytopenia [facility])   [place] [facility] [place], Corrie Dandy T, NP   1 year ago COVID-19   [place] [facility] Larae Grooms, NP   1 year ago Restless leg syndrome   [place] [facility] [place], Dorie Rank, NP       Future Appointments             In 1 week Cannady, Dorie Rank, NP [place] [facility], [facility]

## 2023-05-06 NOTE — Patient Instructions (Signed)
Be Involved in Caring For Your Health:  Taking Medications When medications are taken as directed, they can greatly improve your health. But if they are not taken as prescribed, they may not work. In some cases, not taking them correctly can be harmful. To help ensure your treatment remains effective and safe, understand your medications and how to take them. Bring your medications to each visit for review by your provider.  Your lab results, notes, and after visit summary will be available on My Chart. We strongly encourage you to use this feature. If lab results are abnormal the clinic will contact you with the appropriate steps. If the clinic does not contact you assume the results are satisfactory. You can always view your results on My Chart. If you have questions regarding your health or results, please contact the clinic during office hours. You can also ask questions on My Chart.  We at [facility] are grateful that you chose [place] to provide your care. We strive to provide evidence-based and compassionate care and are always looking for feedback. If you get a survey from the clinic please complete this so we can hear your opinions.  Preventing High Cholesterol Cholesterol is a white, waxy substance similar to fat that the human body needs to help build cells. The liver makes all the cholesterol that a person's body needs. Having high cholesterol (hypercholesterolemia) increases your risk for heart disease and stroke. Extra or excess cholesterol comes from the food that you eat. High cholesterol can often be prevented with diet and lifestyle changes. If you already have high cholesterol, you can control it with diet, lifestyle changes, and medicines. How can high cholesterol affect me? If you have high cholesterol, fatty deposits (plaques) may build up on the walls of your blood vessels. The blood vessels that carry blood away from your heart are called arteries. Plaques make the  arteries narrower and stiffer. This in turn can: Restrict or block blood flow and cause blood clots to form. Increase your risk for heart attack and stroke. What can increase my risk for high cholesterol? This condition is more likely to develop in people who: Eat foods that are high in saturated fat or cholesterol. Saturated fat is mostly found in foods that come from animal sources. Are overweight. Are not getting enough exercise. Use products that contain nicotine or tobacco, such as cigarettes, e-cigarettes, and chewing tobacco. Have a family history of high cholesterol (familial hypercholesterolemia). What actions can I take to prevent this? Nutrition  Eat less saturated fat. Avoid trans fats (partially hydrogenated oils). These are often found in margarine and in some baked goods, fried foods, and snacks bought in packages. Avoid precooked or cured meat, such as bacon, sausages, or meat loaves. Avoid foods and drinks that have added sugars. Eat more fruits, vegetables, and whole grains. Choose healthy sources of protein, such as fish, poultry, lean cuts of red meat, beans, peas, lentils, and nuts. Choose healthy sources of fat, such as: Nuts. Vegetable oils, especially olive oil. Fish that have healthy fats, such as omega-3 fatty acids. These fish include mackerel or salmon. Lifestyle Lose weight if you are overweight. Maintaining a healthy body mass index (BMI) can help prevent or control high cholesterol. It can also lower your risk for diabetes and high blood pressure. Ask your health care provider to help you with a diet and exercise plan to lose weight safely. Do not use any products that contain nicotine or tobacco. These products include cigarettes,  chewing tobacco, and vaping devices, such as e-cigarettes. If you need help quitting, ask your health care provider. Alcohol use Do not drink alcohol if: Your health care provider tells you not to drink. You are pregnant, may be  pregnant, or are planning to become pregnant. If you drink alcohol: Limit how much you have to: 0-1 drink a day for women. 0-2 drinks a day for men. Know how much alcohol is in your drink. In the [place] one drink equals one 12 oz bottle of beer (355 mL), one 5 oz glass of wine (148 mL), or one 1 oz glass of hard liquor (44 mL). Activity  Get enough exercise. Do exercises as told by your health care provider. Each week, do at least 150 minutes of exercise that takes a medium level of effort (moderate-intensity exercise). This kind of exercise: Makes your heart beat faster while allowing you to still be able to talk. Can be done in short sessions several times a day or longer sessions a few times a week. For example, on 5 days each week, you could walk fast or ride your bike 3 times a day for 10 minutes each time. Medicines Your health care provider may recommend medicines to help lower cholesterol. This may be a medicine to lower the amount of cholesterol that your liver makes. You may need medicine if: Diet and lifestyle changes have not lowered your cholesterol enough. You have high cholesterol and other risk factors for heart disease or stroke. Take over-the-counter and prescription medicines only as told by your health care provider. General information Manage your risk factors for high cholesterol. Talk with your health care provider about all your risk factors and how to lower your risk. Manage other conditions that you have, such as diabetes or high blood pressure (hypertension). Have blood tests to check your cholesterol levels at regular points in time as told by your health care provider. Keep all follow-up visits. This is important. Where to find more information American Heart Association: www.heart.org National Heart, Lung, and Blood Institute: [employer] Summary High cholesterol increases your risk for heart disease and stroke. By keeping your cholesterol level low, you  can reduce your risk for these conditions. High cholesterol can often be prevented with diet and lifestyle changes. Work with your health care provider to manage your risk factors, and have your blood tested regularly. This information is not intended to replace advice given to you by your health care provider. Make sure you discuss any questions you have with your health care provider. Document Revised: 02/10/2022 Document Reviewed: 09/13/2020 Elsevier Patient Education  2024 [employer].

## 2023-05-07 ENCOUNTER — Telehealth (INDEPENDENT_AMBULATORY_CARE_PROVIDER_SITE_OTHER): Payer: Self-pay

## 2023-05-07 NOTE — Telephone Encounter (Signed)
Attempted to call and schedule pt for sclero appt LVM for pt TCB

## 2023-05-09 ENCOUNTER — Telehealth (INDEPENDENT_AMBULATORY_CARE_PROVIDER_SITE_OTHER): Payer: Self-pay

## 2023-05-09 NOTE — Telephone Encounter (Signed)
LVM for pt TCB to schedule appt

## 2023-05-11 ENCOUNTER — Encounter: Payer: Self-pay | Admitting: Nurse Practitioner

## 2023-05-11 ENCOUNTER — Ambulatory Visit: Payer: Medicare PPO | Attending: Nurse Practitioner

## 2023-05-11 ENCOUNTER — Ambulatory Visit: Payer: Medicare PPO | Admitting: Nurse Practitioner

## 2023-05-11 VITALS — BP 128/78 | HR 65 | Temp 98.0°F | Ht 64.2 in | Wt 198.0 lb

## 2023-05-11 DIAGNOSIS — R079 Chest pain, unspecified: Secondary | ICD-10-CM

## 2023-05-11 DIAGNOSIS — I700 Atherosclerosis of aorta: Secondary | ICD-10-CM | POA: Diagnosis not present

## 2023-05-11 DIAGNOSIS — E782 Mixed hyperlipidemia: Secondary | ICD-10-CM | POA: Diagnosis not present

## 2023-05-11 DIAGNOSIS — E559 Vitamin D deficiency, unspecified: Secondary | ICD-10-CM | POA: Diagnosis not present

## 2023-05-11 DIAGNOSIS — E785 Hyperlipidemia, unspecified: Secondary | ICD-10-CM

## 2023-05-11 DIAGNOSIS — Z87440 Personal history of urinary (tract) infections: Secondary | ICD-10-CM

## 2023-05-11 DIAGNOSIS — R12 Heartburn: Secondary | ICD-10-CM | POA: Diagnosis not present

## 2023-05-11 DIAGNOSIS — M791 Myalgia, unspecified site: Secondary | ICD-10-CM | POA: Diagnosis not present

## 2023-05-11 DIAGNOSIS — G2581 Restless legs syndrome: Secondary | ICD-10-CM | POA: Diagnosis not present

## 2023-05-11 DIAGNOSIS — E538 Deficiency of other specified B group vitamins: Secondary | ICD-10-CM | POA: Diagnosis not present

## 2023-05-11 DIAGNOSIS — M85831 Other specified disorders of bone density and structure, right forearm: Secondary | ICD-10-CM

## 2023-05-11 DIAGNOSIS — Z0000 Encounter for general adult medical examination without abnormal findings: Secondary | ICD-10-CM | POA: Diagnosis not present

## 2023-05-11 DIAGNOSIS — T466X5A Adverse effect of antihyperlipidemic and antiarteriosclerotic drugs, initial encounter: Secondary | ICD-10-CM

## 2023-05-11 DIAGNOSIS — E6609 Other obesity due to excess calories: Secondary | ICD-10-CM

## 2023-05-11 DIAGNOSIS — Z23 Encounter for immunization: Secondary | ICD-10-CM

## 2023-05-11 DIAGNOSIS — E66811 Obesity, class 1: Secondary | ICD-10-CM

## 2023-05-11 LAB — URINALYSIS, ROUTINE W REFLEX MICROSCOPIC
Bilirubin, UA: NEGATIVE
Glucose, UA: NEGATIVE
Ketones, UA: NEGATIVE
Leukocytes,UA: NEGATIVE
Nitrite, UA: NEGATIVE
Protein,UA: NEGATIVE
RBC, UA: NEGATIVE
Specific Gravity, UA: 1.015 (ref 1.005–1.030)
Urobilinogen, Ur: 1 mg/dL (ref 0.2–1.0)
pH, UA: 7 (ref 5.0–7.5)

## 2023-05-11 NOTE — Assessment & Plan Note (Signed)
Ongoing and stable with Pepcid as needed only.  Will continue current medication regimen and adjust as needed.  Obtain CMP and Mag level today.

## 2023-05-11 NOTE — Assessment & Plan Note (Signed)
Chronic, ongoing with poor statin tolerance in past. Continue current medication regimen, Zetia and Crestor as is tolerating this on 3 day a week regimen.  Could consider discontinuation of Zetia in future if improved levels.  Lipid panel and CMP today. 

## 2023-05-11 NOTE — Assessment & Plan Note (Signed)
Chronic, ongoing.  Noted on labs initially 09/14/21, level 199.  Continue supplement and recheck at physical.

## 2023-05-11 NOTE — Progress Notes (Signed)
BP 128/78   Pulse 65   Temp 98 F (36.7 C) (Oral)   Ht 5' 4.2" (1.631 m)   Wt 198 lb (89.8 kg)   LMP  (LMP Unknown)   BMI 33.78 kg/m    Subjective:    Patient ID: Patricia Ferguson, female    DOB: 01/15/1951, 72 y.o.   MRN: [id number]  HPI: Patricia Ferguson is a 72 y.o. female presenting on 05/11/2023 for comprehensive medical examination. Current medical complaints include:none  She currently lives with: husband -- has been a year since he has drank alcohol Menopausal Symptoms: no  CHEST PAIN Has had some chest pain on and off for the past 6 months and feeling fluttering.  Feels like heart is beating really hard during episodes.  These last about a couple minutes. Time since onset: Duration:months Onset: sudden Quality: dull and aching Severity: 2/10 Location: substernal Radiation: none Episode duration:  Frequency: intermittent Related to exertion: no Activity when pain started: unknown Trauma: no Anxiety/recent stressors: no Aggravating factors:  Alleviating factors:  Status: stable Treatments attempted: will take Ibuprofen Current pain status: pain free Shortness of breath: no Cough: no Nausea: no Diaphoresis: no Heartburn:  this has improved, does not take Pepcid daily anymore as is watching what she eats more often.  Only takes as needed. Palpitations: yes   HYPERLIPIDEMIA Continues to tolerate Zetia and Crestor 3 days a week.  History of intolerance to statin therapy with muscle pain.   Hyperlipidemia status: good compliance Satisfied with current treatment?  yes Side effects:  no Medication compliance: good compliance Supplements: none Aspirin:  no The 10-year ASCVD risk score (Arnett DK, et al., 2019) is: 10%   Values used to calculate the score:     Age: 56 years     Sex: Female     Is Non-Hispanic African American: No     Diabetic: No     Tobacco smoker: No     Systolic Blood Pressure: 128 mmHg     Is BP treated: No     HDL Cholesterol: 51  mg/dL     Total Cholesterol: 152 mg/dL Chest pain:  no Coronary artery disease:  no Family history CAD:  grandfather had MI at 75 Family history early CAD:  no   LEG CRAMPS Taking Gabapentin 200 MG at bedtime.  B12 and Vit D for history of low levels.  These have improved. Duration: chronic Pain: yes Location: lower legs Bilateral:  no Onset: ongoing Frequency:  at least once a week Time of  day:  occurs mostly in the evening and night Sudden unintentional leg jerking:   no Paresthesias:  no Decreased sensation:  no Weakness:   no Insomnia:   no Fatigue:   no Alleviating factors: walking sometimes help Aggravating factors: unknown Status: improvement Treatments attempted: Magnesium +Gabapentin + iron + B12  OSTEOPENIA Taking Vitamin D daily.  Last DEXA 05/23/22 with T-score -1.6. Satisfied with current treatment?: yes Adequate calcium & vitamin D: yes Weight bearing exercises: yes      05/11/2023    8:26 AM 11/08/2022    8:21 AM 05/09/2022    8:17 AM 03/20/2022   11:11 AM 03/14/2022    8:42 AM  Depression screen PHQ 2/9  Decreased Interest 0 0 0 1 2  Down, Depressed, Hopeless 0 0 1 1 1   PHQ - 2 Score 0 0 1 2 3   Altered sleeping 0 0 1 1 2   Tired, decreased energy 1 0 0 1 0  Change in appetite 0 0 1 0 2  Feeling bad or failure about yourself  0 0 1 0 2  Trouble concentrating 0 0 0 0 0  Moving slowly or fidgety/restless 0 0 0 0 0  Suicidal thoughts 0 0 0 0 0  PHQ-9 Score 1 0 4 4 9   Difficult doing work/chores Not difficult at all Not difficult at all Not difficult at all Somewhat difficult Not difficult at all       05/11/2023    8:31 AM 11/08/2022    8:21 AM 05/09/2022    8:18 AM 03/14/2022    8:42 AM  GAD 7 : Generalized Anxiety Score  Nervous, Anxious, on Edge 0 0 1 2  Control/stop worrying 0 1 1 1   Worry too much - different things 0 1 0 1  Trouble relaxing 0 0 1 2  Restless 0 0 1 1  Easily annoyed or irritable 0 0 0 0  Afraid - awful might happen 0 0 1  0  Total GAD 7 Score 0 2 5 7   Anxiety Difficulty Not difficult at all Not difficult at all Not difficult at all Not difficult at all      05/09/2022    8:16 AM 05/09/2022    8:43 AM 06/02/2022   10:18 AM 11/08/2022    8:20 AM 05/11/2023    8:26 AM  Fall Risk  Falls in the past year? 0 0  0 0  Was there an injury with Fall? 0 0  0 0  Fall Risk Category Calculator 0 0  0 0  Fall Risk Category (Retired) Low Low     (RETIRED) Patient Fall Risk Level Low fall risk Low fall risk Low fall risk    Patient at Risk for Falls Due to No Fall Risks No Fall Risks  No Fall Risks No Fall Risks  Fall risk Follow up Falls evaluation completed Falls prevention discussed  Falls evaluation completed Falls evaluation completed    Functional Status Survey: Is the patient deaf or have difficulty hearing?: No Does the patient have difficulty seeing, even when wearing glasses/contacts?: No Does the patient have difficulty concentrating, remembering, or making decisions?: No Does the patient have difficulty walking or climbing stairs?: No Does the patient have difficulty dressing or bathing?: No Does the patient have difficulty doing errands alone such as visiting a doctor's office or shopping?: No   Past Medical History:  Past Medical History:  Diagnosis Date   Allergy    Arthritis    right knee   FH: colonic polyps    GERD (gastroesophageal reflux disease)    Hypercholesterolemia    Internal hemorrhoids    Obesity    Osteopenia    Pneumonia    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   ezetimibe (ZETIA) 10 MG tablet Take 1 tablet (10 mg total) by mouth daily.   famotidine (PEPCID) 20 MG tablet TAKE 1 TABLET BY MOUTH ONCE DAILY   gabapentin (NEURONTIN) 100 MG capsule Take 2 capsules (200 mg total) by mouth at bedtime.   hydroxypropyl methylcellulose / hypromellose (ISOPTO TEARS / GONIOVISC) 2.5 % ophthalmic solution Place 1 drop into both eyes daily.   Magnesium 250 MG TABS Take 1 tablet by mouth daily.   rosuvastatin (CRESTOR) 20 MG tablet TAKE 1 TABLET BY MOUTH THREE TIMES A WEEK (MON WEDNESDAY AND FRIDAY)   vitamin B-12 (CYANOCOBALAMIN) 500 MCG tablet Take 500 mcg by mouth daily.   Vitamin D, Cholecalciferol, 25 MCG (1000 UT) TABS Take 1 tablet by mouth daily.   No current facility-administered medications on file prior to visit.    Allergies:  Allergies  Allergen Reactions   Meloxicam Other (See Comments)    Face got flushed and red.   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    Lives with husband.   Social Determinants of Health   Financial Resource Strain: Low Risk  (05/11/2023)   Overall Financial Resource Strain (CARDIA)    Difficulty of Paying Living Expenses: Not hard at all  Food Insecurity: No Food Insecurity (05/11/2023)   Hunger Vital Sign    Worried About Running Out of Food in the Last Year: Never true    Ran Out of Food in the Last Year: Never true  Transportation Needs: No Transportation Needs (05/11/2023)   PRAPARE - Administrator, Civil Service (Medical): No    Lack of Transportation (Non-Medical): No  Physical Activity: Insufficiently Active (05/11/2023)   Exercise Vital Sign    Days of Exercise per Week: 3 days    Minutes of Exercise per Session: 30 min  Stress: No Stress Concern Present (05/11/2023)   [employer] of Occupational Health - Occupational Stress Questionnaire    Feeling of Stress : Only a little  Social Connections: Moderately Integrated (05/11/2023)   Social Connection and Isolation Panel [NHANES]    Frequency of  Communication with Friends and Family: More than three times a week    Frequency of Social Gatherings with Friends and Family: Three times a week    Attends Religious Services: More than 4 times per year    Active Member of Clubs or Organizations: No    Attends Banker Meetings: Never    Marital Status: Married  Catering manager Violence: Not At Risk (05/11/2023)   Humiliation, Afraid, Rape, and Kick questionnaire    Fear of Current or Ex-Partner: No    Emotionally Abused: No     Physically Abused: No    Sexually Abused: No   Social History   Tobacco Use  Smoking Status Never  Smokeless Tobacco Never   Social History   Substance and Sexual Activity  Alcohol Use Not Currently   Comment: on occasion    Family History:  Family History  Problem Relation Age of Onset   Diabetes Mother    Hyperlipidemia Mother    Hypertension Mother    Osteoporosis Mother    Arthritis Mother    Cancer Father        lung   Diabetes Father    Diabetes Sister    Hyperlipidemia Sister    Hypertension Sister    Hypertension Brother    Hyperlipidemia Sister    Breast cancer Neg Hx     Past medical history, surgical history, medications, allergies, family history and social history reviewed with patient today and changes made to appropriate areas of the chart.   ROS All other ROS negative except what is listed above and in the HPI.      Objective:    BP 128/78   Pulse 65   Temp 98 F (36.7 C) (Oral)   Ht 5' 4.2" (1.631 m)   Wt 198 lb (89.8 kg)   LMP  (LMP Unknown)   BMI 33.78 kg/m   Wt Readings from Last 3 Encounters:  05/11/23 198 lb (89.8 kg)  04/09/23 198 lb 6.4 oz (90 kg)  12/25/22 202 lb 3.2 oz (91.7 kg)    Physical Exam Vitals and nursing note reviewed. Exam conducted with a chaperone present.  Constitutional:      General: She is awake. She is not in acute distress.    Appearance: She is well-developed and well-groomed. She is not ill-appearing or toxic-appearing.  HENT:     Head: Normocephalic and atraumatic.     Right Ear: Hearing, tympanic membrane, ear canal and external ear normal. No drainage.     Left Ear: Hearing, tympanic membrane, ear canal and external ear normal. No drainage.     Nose: Nose normal.     Right Sinus: No maxillary sinus tenderness or frontal sinus tenderness.     Left Sinus: No maxillary sinus tenderness or frontal sinus tenderness.     Mouth/Throat:     Mouth: Mucous membranes are moist.     Pharynx: Oropharynx is  clear. Uvula midline. No pharyngeal swelling, oropharyngeal exudate or posterior oropharyngeal erythema.  Eyes:     General: Lids are normal.        Right eye: No discharge.        Left eye: No discharge.     Extraocular Movements: Extraocular movements intact.     Conjunctiva/sclera: Conjunctivae normal.     Pupils: Pupils are equal, round, and reactive to light.     Visual Fields: Right eye visual fields normal and left eye visual fields normal.  Neck:     Thyroid: No  thyromegaly.     Vascular: No carotid bruit.     Trachea: Trachea normal.  Cardiovascular:     Rate and Rhythm: Normal rate and regular rhythm.     Heart sounds: Normal heart sounds. No murmur heard.    No gallop.  Pulmonary:     Effort: Pulmonary effort is normal. No accessory muscle usage or respiratory distress.     Breath sounds: Normal breath sounds.  Chest:  Breasts:    Right: Normal.     Left: Normal.  Abdominal:     General: Bowel sounds are normal.     Palpations: Abdomen is soft. There is no hepatomegaly or splenomegaly.     Tenderness: There is no abdominal tenderness.  Musculoskeletal:        General: Normal range of motion.     Cervical back: Normal range of motion and neck supple.     Right lower leg: No edema.     Left lower leg: No edema.  Lymphadenopathy:     Head:     Right side of head: No submental, submandibular, tonsillar, preauricular or posterior auricular adenopathy.     Left side of head: No submental, submandibular, tonsillar, preauricular or posterior auricular adenopathy.     Cervical: No cervical adenopathy.     Upper Body:     Right upper body: No supraclavicular, axillary or pectoral adenopathy.     Left upper body: No supraclavicular, axillary or pectoral adenopathy.  Skin:    General: Skin is warm and dry.     Capillary Refill: Capillary refill takes less than 2 seconds.     Findings: No rash.  Neurological:     Mental Status: She is alert and oriented to person, place,  and time.     Gait: Gait is intact.     Deep Tendon Reflexes: Reflexes are normal and symmetric.     Reflex Scores:      Brachioradialis reflexes are 2+ on the right side and 2+ on the left side.      Patellar reflexes are 2+ on the right side and 2+ on the left side. Psychiatric:        Attention and Perception: Attention normal.        Mood and Affect: Mood normal.        Speech: Speech normal.        Behavior: Behavior normal. Behavior is cooperative.        Thought Content: Thought content normal.        Judgment: Judgment normal.   EKG My review and personal interpretation at Time: 9:15  Indication: palpitations  Rate: 60  Rhythm: sinus Axis: normal Other: No nonspecific st abn, no stemi, no lvh     05/11/2023    8:31 AM 03/20/2022   11:07 AM 03/18/2021   11:19 AM 03/15/2020    9:04 AM 02/25/2018    8:26 AM  6CIT Screen  What Year? 0 points 0 points 0 points 0 points 0 points  What month? 0 points 0 points 0 points 0 points 0 points  What time? 0 points 0 points 0 points 0 points 0 points  Count back from 20 0 points 0 points 0 points 0 points 0 points  Months in reverse 0 points 0 points 0 points 0 points 0 points  Repeat phrase 2 points 0 points 2 points 0 points 0 points  Total Score 2 points 0 points 2 points 0 points 0 points   Results for orders placed  or performed in visit on 05/11/23  Urinalysis, Routine w reflex microscopic  Result Value Ref Range   Specific Gravity, UA 1.015 1.005 - 1.030   pH, UA 7.0 5.0 - 7.5   Color, UA Yellow Yellow   Appearance Ur Clear Clear   Leukocytes,UA Negative Negative   Protein,UA Negative Negative/Trace   Glucose, UA Negative Negative   Ketones, UA Negative Negative   RBC, UA Negative Negative   Bilirubin, UA Negative Negative   Urobilinogen, Ur 1.0 0.2 - 1.0 mg/dL   Nitrite, UA Negative Negative   Microscopic Examination Comment       Assessment & Plan:   Problem List Items Addressed This Visit       Cardiovascular  and Mediastinum   Aortic atherosclerosis (HCC)    Chronic, stable.  Noted on x-ray imaging 11/15/21.  Recommend she continue statin as ordered and take daily Baby ASA for prevention.      Relevant Orders   CBC with Differential/Platelet   Comprehensive metabolic panel   Lipid Panel w/o Chol/HDL Ratio     Musculoskeletal and Integument   Osteopenia    Chronic.  On holiday from [place], started January 2022.  DEXA in 2019 and 2014 noted osteopenia.  Repeat 05/13/22 noted osteopenia with improvement in levels.  Vitamin D level today.      Relevant Orders   VITAMIN D 25 Hydroxy (Vit-D Deficiency, Fractures)     Other   Chest pain    On and off for 6 months reported with palpitations (more rapid heart rate) when present.  No other symptoms.  EKG today is reassuring.  Will order 14 day Zio monitor to further assess, educated her on this.  Recommend she start a daily ASA 81 MG.  Educated her on red flag symptoms and aware to go to ER if these present.      Relevant Orders   EKG 12-Lead (Completed)   LONG TERM MONITOR (3-14 DAYS)   Heart burn    Ongoing and stable with Pepcid as needed only.  Will continue current medication regimen and adjust as needed.  Obtain CMP and Mag level today.      Hyperlipidemia    Chronic, ongoing with poor statin tolerance in past. Continue current medication regimen, Zetia and Crestor as is tolerating this on 3 day a week regimen.  Could consider discontinuation of Zetia in future if improved levels.  Lipid panel and CMP today.      Relevant Orders   CBC with Differential/Platelet   Comprehensive metabolic panel   Lipid Panel w/o Chol/HDL Ratio   Myalgia due to statin    History of trial on various statins with side effect of myalgia, continue Zetia and tolerating Crestor at three days a week at this time.  Continue this and if any adverse effects will consider change to Repatha.      Obesity    BMI 33.78.  Recommended eating smaller high protein, low  fat meals more frequently and exercising 30 mins a day 5 times a week with a goal of 10-15lb weight loss in the next 3 months. Patient voiced their understanding and motivation to adhere to these recommendations.       Restless leg syndrome    Chronic, ongoing.   - Continue Gabapentin and Requip. - Continue Magnesium and B12 supplements + iron - Takes mustard with benefit, continue this. - Could consider period of physical therapy or massage therapy if worsening.  Recommend she wear compression hose on during  day and off at night.      Relevant Orders   TSH   Vitamin B12   Vitamin B 12 deficiency    Chronic, ongoing.  Noted on labs initially 09/14/21, level 199.  Continue supplement and recheck at physical.      Relevant Orders   Vitamin B12   Vitamin D deficiency    Chronic, ongoing, recheck level today and continue current supplement. May need to increase supplement dosing if remains <30.      Relevant Orders   VITAMIN D 25 Hydroxy (Vit-D Deficiency, Fractures)   Other Visit Diagnoses     Medicare annual wellness visit, subsequent    -  Primary   Medicare wellness due today, educated patient.   History of UTI       Recheck urine today.   Relevant Orders   Urinalysis, Routine w reflex microscopic (Completed)   Flu vaccine need       Flu vaccine provided today, educated on this.        Follow up plan: Return in about 8 weeks (around 07/06/2023) for Chest Discomfort.   LABORATORY TESTING:  - Pap smear: not applicable  IMMUNIZATIONS:   - Tdap: Tetanus vaccination status reviewed: last tetanus booster within 10 years. - Influenza: Up to date - Pneumovax: Up to date - Prevnar: Up to date - COVID: Up to date - HPV: Not applicable - Shingrix vaccine: Up to date  SCREENING: -Mammogram: Up to date  - Colonoscopy: Up to date  - Bone Density: Not applicable  -Hearing Test: Not applicable  -Spirometry: Not applicable   PATIENT COUNSELING:   Advised to take 1 mg  of folate supplement per day if capable of pregnancy.   Sexuality: Discussed sexually transmitted diseases, partner selection, use of condoms, avoidance of unintended pregnancy  and contraceptive alternatives.   Advised to avoid cigarette smoking.  I discussed with the patient that most people either abstain from alcohol or drink within safe limits (<=14/week and <=4 drinks/occasion for males, <=7/weeks and <= 3 drinks/occasion for females) and that the risk for alcohol disorders and other health effects rises proportionally with the number of drinks per week and how often a drinker exceeds daily limits.  Discussed cessation/primary prevention of drug use and availability of treatment for abuse.   Diet: Encouraged to adjust caloric intake to maintain  or achieve ideal body weight, to reduce intake of dietary saturated fat and total fat, to limit sodium intake by avoiding high sodium foods and not adding table salt, and to maintain adequate dietary potassium and calcium preferably from fresh fruits, vegetables, and low-fat dairy products.    Stressed the importance of regular exercise  Injury prevention: Discussed safety belts, safety helmets, smoke detector, smoking near bedding or upholstery.   Dental health: Discussed importance of regular tooth brushing, flossing, and dental visits.    NEXT PREVENTATIVE PHYSICAL DUE IN 1 YEAR. Return in about 8 weeks (around 07/06/2023) for Chest Discomfort.

## 2023-05-11 NOTE — Assessment & Plan Note (Signed)
Chronic, ongoing, recheck level today and continue current supplement. May need to increase supplement dosing if remains <30.

## 2023-05-11 NOTE — Assessment & Plan Note (Signed)
Chronic.  On holiday from [place], started January 2022.  DEXA in 2019 and 2014 noted osteopenia.  Repeat 05/13/22 noted osteopenia with improvement in levels.  Vitamin D level today.

## 2023-05-11 NOTE — Assessment & Plan Note (Signed)
Chronic, stable.  Noted on x-ray imaging 11/15/21.  Recommend she continue statin as ordered and take daily Baby ASA for prevention.

## 2023-05-11 NOTE — Progress Notes (Signed)
Contacted via MyChart   No infection present!!!

## 2023-05-11 NOTE — Assessment & Plan Note (Signed)
BMI 33.78.  Recommended eating smaller high protein, low fat meals more frequently and exercising 30 mins a day 5 times a week with a goal of 10-15lb weight loss in the next 3 months. Patient voiced their understanding and motivation to adhere to these recommendations.

## 2023-05-11 NOTE — Assessment & Plan Note (Signed)
Chronic, ongoing.   - Continue Gabapentin and Requip. - Continue Magnesium and B12 supplements + iron - Takes mustard with benefit, continue this. - Could consider period of physical therapy or massage therapy if worsening.  Recommend she wear compression hose on during day and off at night.

## 2023-05-11 NOTE — Assessment & Plan Note (Signed)
History of trial on various statins with side effect of myalgia, continue Zetia and tolerating Crestor at three days a week at this time.  Continue this and if any adverse effects will consider change to Repatha. 

## 2023-05-11 NOTE — Assessment & Plan Note (Signed)
On and off for 6 months reported with palpitations (more rapid heart rate) when present.  No other symptoms.  EKG today is reassuring.  Will order 14 day Zio monitor to further assess, educated her on this.  Recommend she start a daily ASA 81 MG.  Educated her on red flag symptoms and aware to go to ER if these present.

## 2023-05-12 LAB — TSH: TSH: 2.43 u[IU]/mL (ref 0.450–4.500)

## 2023-05-12 LAB — CBC WITH DIFFERENTIAL/PLATELET
Basophils Absolute: 0.1 10*3/uL (ref 0.0–0.2)
Basos: 2 %
EOS (ABSOLUTE): 0.1 10*3/uL (ref 0.0–0.4)
Eos: 2 %
Hematocrit: 42.5 % (ref 34.0–46.6)
Hemoglobin: 13.8 g/dL (ref 11.1–15.9)
Immature Grans (Abs): 0 10*3/uL (ref 0.0–0.1)
Immature Granulocytes: 0 %
Lymphocytes Absolute: 1.7 10*3/uL (ref 0.7–3.1)
Lymphs: 33 %
MCH: 29.9 pg (ref 26.6–33.0)
MCHC: 32.5 g/dL (ref 31.5–35.7)
MCV: 92 fL (ref 79–97)
Monocytes Absolute: 0.5 10*3/uL (ref 0.1–0.9)
Monocytes: 9 %
Neutrophils Absolute: 2.8 10*3/uL (ref 1.4–7.0)
Neutrophils: 54 %
Platelets: 178 10*3/uL (ref 150–450)
RBC: 4.61 x10E6/uL (ref 3.77–5.28)
RDW: 12.3 % (ref 11.7–15.4)
WBC: 5.2 10*3/uL (ref 3.4–10.8)

## 2023-05-12 LAB — COMPREHENSIVE METABOLIC PANEL
ALT: 16 [IU]/L (ref 0–32)
AST: 23 [IU]/L (ref 0–40)
Albumin: 4.4 g/dL (ref 3.8–4.8)
Alkaline Phosphatase: 77 [IU]/L (ref 44–121)
BUN/Creatinine Ratio: 17 (ref 12–28)
BUN: 12 mg/dL (ref 8–27)
Bilirubin Total: 1 mg/dL (ref 0.0–1.2)
CO2: 26 mmol/L (ref 20–29)
Calcium: 9.2 mg/dL (ref 8.7–10.3)
Chloride: 102 mmol/L (ref 96–106)
Creatinine, Ser: 0.7 mg/dL (ref 0.57–1.00)
Globulin, Total: 2.4 g/dL (ref 1.5–4.5)
Glucose: 87 mg/dL (ref 70–99)
Potassium: 4.2 mmol/L (ref 3.5–5.2)
Sodium: 143 mmol/L (ref 134–144)
Total Protein: 6.8 g/dL (ref 6.0–8.5)
eGFR: 92 mL/min/{1.73_m2} (ref >59)

## 2023-05-12 LAB — VITAMIN D 25 HYDROXY (VIT D DEFICIENCY, FRACTURES): Vit D, 25-Hydroxy: 34.2 ng/mL (ref 30.0–100.0)

## 2023-05-12 LAB — VITAMIN B12: Vitamin B-12: 425 pg/mL (ref 232–1245)

## 2023-05-12 LAB — LIPID PANEL W/O CHOL/HDL RATIO
Cholesterol, Total: 156 mg/dL (ref 100–199)
HDL: 57 mg/dL (ref >39)
LDL Chol Calc (NIH): 82 mg/dL (ref 0–99)
Triglycerides: 92 mg/dL (ref 0–149)
VLDL Cholesterol Cal: 17 mg/dL (ref 5–40)

## 2023-05-12 NOTE — Progress Notes (Signed)
Contacted via MyChart   Good morning [place], overall all labs remain stable. No medication changes needed.  Any question on these?  Let me know if you do not get Zio monitor over the next week.   Keep being stellar!!  Thank you for allowing me to participate in your care.  I appreciate you. Kindest regards, Mescal Flinchbaugh

## 2023-05-16 DIAGNOSIS — R079 Chest pain, unspecified: Secondary | ICD-10-CM

## 2023-06-05 ENCOUNTER — Encounter (INDEPENDENT_AMBULATORY_CARE_PROVIDER_SITE_OTHER): Payer: Self-pay | Admitting: Nurse Practitioner

## 2023-06-05 ENCOUNTER — Ambulatory Visit (INDEPENDENT_AMBULATORY_CARE_PROVIDER_SITE_OTHER): Payer: Medicare PPO | Admitting: Nurse Practitioner

## 2023-06-05 ENCOUNTER — Ambulatory Visit (INDEPENDENT_AMBULATORY_CARE_PROVIDER_SITE_OTHER): Payer: Medicare PPO | Admitting: Podiatry

## 2023-06-05 ENCOUNTER — Ambulatory Visit (INDEPENDENT_AMBULATORY_CARE_PROVIDER_SITE_OTHER): Payer: Medicare PPO

## 2023-06-05 ENCOUNTER — Encounter: Payer: Self-pay | Admitting: Podiatry

## 2023-06-05 VITALS — BP 136/78 | HR 80 | Resp 16 | Wt 199.0 lb

## 2023-06-05 VITALS — Ht 64.2 in | Wt 200.0 lb

## 2023-06-05 DIAGNOSIS — M2041 Other hammer toe(s) (acquired), right foot: Secondary | ICD-10-CM | POA: Diagnosis not present

## 2023-06-05 DIAGNOSIS — M2042 Other hammer toe(s) (acquired), left foot: Secondary | ICD-10-CM | POA: Diagnosis not present

## 2023-06-05 DIAGNOSIS — M7752 Other enthesopathy of left foot: Secondary | ICD-10-CM

## 2023-06-05 DIAGNOSIS — I8310 Varicose veins of unspecified lower extremity with inflammation: Secondary | ICD-10-CM

## 2023-06-05 DIAGNOSIS — I8312 Varicose veins of left lower extremity with inflammation: Secondary | ICD-10-CM

## 2023-06-05 DIAGNOSIS — Z9889 Other specified postprocedural states: Secondary | ICD-10-CM

## 2023-06-05 DIAGNOSIS — R079 Chest pain, unspecified: Secondary | ICD-10-CM | POA: Diagnosis not present

## 2023-06-05 NOTE — Progress Notes (Signed)
Chief Complaint  Patient presents with   Hammer Toe    Left 2,3,4, repair here for F/U, patient states she is doing very well    Subjective:  Patient presents today status post hammertoe repair digits 2, 3, 4 of the right foot.  DOS: 01/18/2023.  Patient is doing very well to the right foot.  She is back to full activity and good supportive tennis shoes and sneakers.  Overall she is very satisfied  Patient states that over the past few weeks she has developed recurrence of pain and tenderness associated to the hammertoes to the left foot.  She received injections in the past which helped for several months.  She says that slowly over time the pain has returned to the left foot hammertoes.  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 SCREWS IN BACK   bone spur     BREAST BIOPSY Right 1980's   BREAST SURGERY     CERVICAL CONE BIOPSY     COLONOSCOPY WITH PROPOFOL N/A 06/02/2022   Procedure: COLONOSCOPY WITH PROPOFOL;  Surgeon: Wyline Mood, MD;  Location: [facility] END[facility]COPY;  Service: Gastroenterology;  Laterality: N/A;   HAMMER TOE SURGERY Right 02/01/2023   lumbar fission     MASS EXCISION Left 01/07/2020   Procedure: EXCISION MASS (RESECTION OF ANTECUBITAL VENOUS ANEURYSM);  Surgeon: Renford Dills, MD;  Location: [facility] ORS;  Service: Vascular;  Laterality: Left;   SPINE SURGERY  2003    Allergies  Allergen Reactions   Meloxicam Other (See Comments)    Face got flushed and red.   Adhesive [Tape] Rash    PAPER TAPE OKAY   Lipitor [Atorvastatin] Other (See  Comments)    Muscle aches   Oxycodone Rash   Percodan [Oxycodone-Aspirin] Rash   Pravachol [Pravastatin Sodium] Other (See Comments)    Muscle aches    Propoxyphene Other (See Comments)    depression    Objective/Physical Exam Neurovascular status intact.  Incisions nicely coapted and healed.  Toes are in good rectus alignment to the right foot.  Hammertoe deformity noted to the digits 2-4 of the left foot with associated tenderness with palpation specifically to the second digit  Radiographic Exam RT foot 06/05/2023:  Arthroplasties noted with good alignment of the lesser digits to the right foot.  Assessment: 1. s/p hammertoe repair digits 2, 3, 4 right foot.  DOS: 01/18/2023 2.  Symptomatic hammertoes 2, 3, 4 LT foot   Plan of Care:  -Patient was evaluated.  - From a surgical standpoint the patient may resume full activity no restrictions -Patient declined cortisone injection to the left foot today.  She says that ultimately she would like to have the hammertoes corrected for perhaps in February 2025 -Return to clinic after the holidays for surgical consult   Patricia Ferguson, DPM [facility]  Dr. Felecia Ferguson, DPM    [address]  [place], [place] [address]                Office [phone]  Fax [phone]

## 2023-06-05 NOTE — Progress Notes (Signed)
Varicose veins of left lower extremity with inflammation (454.1  I83.10) Current Plans   Indication: Patient presents with symptomatic varicose veins of the left lower extremity.   Procedure: Sclerotherapy using hypertonic saline mixed with 1% Lidocaine was performed on the left lower extremity. Compression wraps were placed. The patient tolerated the procedure well. 

## 2023-06-05 NOTE — Addendum Note (Signed)
Addended by: Jonni Sanger on: 06/05/2023 01:53 PM   Modules accepted: Orders

## 2023-06-06 ENCOUNTER — Ambulatory Visit
Admission: RE | Admit: 2023-06-06 | Discharge: 2023-06-06 | Disposition: A | Discharge status: Home / Self Care | Payer: No Typology Code available for payment source | Source: Ambulatory Visit | Attending: Physician Assistant | Admitting: Physician Assistant

## 2023-06-06 ENCOUNTER — Ambulatory Visit (INDEPENDENT_AMBULATORY_CARE_PROVIDER_SITE_OTHER): Payer: Self-pay | Admitting: Physician Assistant

## 2023-06-06 ENCOUNTER — Other Ambulatory Visit: Payer: Self-pay

## 2023-06-06 ENCOUNTER — Encounter: Payer: Self-pay | Admitting: Physician Assistant

## 2023-06-06 VITALS — BP 130/80 | HR 65 | Temp 95.9°F | Ht 64.0 in | Wt 199.0 lb

## 2023-06-06 DIAGNOSIS — M79641 Pain in right hand: Secondary | ICD-10-CM

## 2023-06-06 DIAGNOSIS — M79675 Pain in left toe(s): Secondary | ICD-10-CM

## 2023-06-06 DIAGNOSIS — W101XXA Fall (on)(from) sidewalk curb, initial encounter: Secondary | ICD-10-CM

## 2023-06-06 DIAGNOSIS — S60511A Abrasion of right hand, initial encounter: Secondary | ICD-10-CM

## 2023-06-06 NOTE — Progress Notes (Signed)
[facility] [address]  [place], [place] [address] Phone [phone] Fax  [phone]  Worker's Compensation Report Form   Patricia Ferguson Date of ZHYQM:[id number] Phone Number:[phone] Email:[email or web address] Department:[facility] Job Title:Program Assistant Supervisor:Michael Mayford Knife Supervisor Notified:Y  Date of Injury:06/05/2023 Time of Injury:10:00am Shift Worked:1st Location where injury occurred (address or landmark):[place] on sidewalk  Body Part Injured:Right Hand, Left Foot, anterior chest wall  Vital Signs BP 130/80   Pulse 65   Temp (!) 95.9 F (35.5 C)   Ht 5\' 4"  (1.626 m)   Wt 199 lb (90.3 kg)   LMP  (LMP Unknown)   SpO2 96%   BMI 34.16 kg/m   Injury Description  "Walking down sidewalk. Unstable on uneven sidewalk and fell.    Provider Note S:  72 y/o F presents to the clinic and states she was walking towards her car on a sidewalk when she fell forward on uneven sidewalk. She braced her fall with both hands. She thinks she hit her head, but no abrasions or bleeding. Denies LOC. She went back to her office and reported it. Currently c/o R hand abrasions and swelling of the base of the R thumb. She woke up today and feels anterior chest wall "sore", and pain on the left 2nd toe. Slight pain over the left elbow with applying pressure to the area. No previous injury to the same areas including R hand, left elbow. She did have a steroid injection to her left second toe by her Orthopedist/podiatrist in July 2024, but no surgery. She denies knee pain or abrasions.   O: Cardiac: RRR Lungs: CTAB Right Hand: Abrasions to the palmar surface of the R hand. Not actively bleeding. +TTP over the 1st MCP joint. Slight pain with flexion at the 1st MCP joint. +Swelling. No ecchymosis Left Elbow: Mild TTP over the ulnar aspect of the proximal forearm. No swelling or ecchymosis present Both Knees:  Non-tender. No abrasions. No ecchymosis. No swelling identified. Left 2nd Toe: +TTP at the dorsal aspect of the MP. No ecchymosis or swelling appreciated.  A/P: Diagnosis  Fall (on)(from) sidewalk curb, initial encounter W10.1XXA  Abrasion of palm of right hand, initial encounter S60.511A  Right hand pain M79.641  Toe pain, left M79.675   Reviewed my clinical findings with patient.  Concerned for the base of R thumb swelling and pain as well as left 2nd toe. Will request X-ray of R hand and Left toes.  Keep abrasions covered at workplace and leave it open at home. Continue to watch for sings of infection including swelling, purulent drainage, red streaks, erythema, etc. She may take otc Tylenol or Ibuprofen for pain.  Apply ice as instructed every 2-3 hours for 15 mins each time. I would like to follow up with her at the clinic if x-rays are negative  She was given contact information for [facility], [facility]  outpatient imaging and [facility].  Spent 30 minutes with patient.   Medications Prescribed  No orders of the defined types were placed in this encounter.   Referred to [facility] for x-rays of R hand and left toes.    Return to Work Status Regular duty Return to clinic in 6 days or sooner if any difficulty arises.    Provider Signature ________________________________________Date_________   Employee Signature _______________________________________Date_________   Please email this completed form to Angus Seller, Director of Risk Management at [email or web address] within 24 hours of visit.

## 2023-06-07 NOTE — Progress Notes (Signed)
Contacted via MyChart   Good evening Patricia Ferguson, your long term cardiac monitor results have returned there were some nonsustained (meaning not prolonged and ongoing) episodes of what we call supraventricular tachycardia (elevation in heart rate).  These are for short periods.  Are you still feeling palpitations?  Let me know.  If so I can provide you with an as needed medicine to take if you feel these more rapid episodes.  Overall your rhythm is normal and your symptoms are present with the normal periods.  Any questions? Keep being stellar!!  Thank you for allowing me to participate in your care.  I appreciate you. Kindest regards, Anora Schwenke

## 2023-06-08 ENCOUNTER — Encounter: Payer: Self-pay | Admitting: Nurse Practitioner

## 2023-06-12 ENCOUNTER — Other Ambulatory Visit: Payer: Self-pay

## 2023-06-12 ENCOUNTER — Ambulatory Visit (INDEPENDENT_AMBULATORY_CARE_PROVIDER_SITE_OTHER): Payer: Self-pay | Admitting: Physician Assistant

## 2023-06-12 ENCOUNTER — Encounter: Payer: Self-pay | Admitting: Physician Assistant

## 2023-06-12 VITALS — BP 120/70 | HR 89

## 2023-06-12 DIAGNOSIS — M79641 Pain in right hand: Secondary | ICD-10-CM

## 2023-06-12 DIAGNOSIS — W101XXD Fall (on)(from) sidewalk curb, subsequent encounter: Secondary | ICD-10-CM

## 2023-06-12 DIAGNOSIS — M79675 Pain in left toe(s): Secondary | ICD-10-CM

## 2023-06-12 DIAGNOSIS — S60511D Abrasion of right hand, subsequent encounter: Secondary | ICD-10-CM

## 2023-06-12 NOTE — Progress Notes (Signed)
[facility] [address]  [place], [place] [address] Phone [phone] Fax  [phone]  Worker's Compensation Report Form   Patricia Ferguson Date of LKGMW:[id number] Phone Number:[phone] Email:[email or web address] Department:[facility] Job Title:Program Assistant Supervisor:Michael Williams Supervisor Notified:Y   Date of Injury:06/05/2023 Time of Injury:10:00am Shift Worked:1st Location where injury occurred (address or landmark):[place] on sidewalk   Body Part Injured:Right Hand, Left Foot, anterior chest wall  Vital Signs BP 120/70   Pulse 89   LMP  (LMP Unknown)   SpO2 95%   Injury Description  "Walking down sidewalk. Unstable on uneven sidewalk and fell.    Provider Note S: 72 y/o F presents to the clinic for a f/u for fall. Today she has complains of left 2nd toe pain worse with walking. Her right hand wounds are  healing. Does have bruises of both forearms and right distal thigh. Awaiting x-ray reports for R hand and L 2nd toe.  O: Right Hand: Healing wound at the base of the right thumb. No swelling. No discharge. Minimal TTP and localized erythema. ROM is slightly painful with flexion. Left 2nd Toe: +TTP. No swelling or erythema. Gait is normal.   A/P: Diagnosis  Fall on or from sidewalk curb, subsequent encounter W10.1XXD  Abrasion of right hand, subsequent encounter S60.511D  Right hand pain M79.641  Toe pain, left M79.675   Reviewed my clinical findings with patient. May cover the right hand wound with band aid at workplace, but remove dressing and air-dry the wound at home. She verbalized understanding and in agreement. Await x-ray results of the right hand and left 2nd toe.  RTC in 6 days or sooner if any difficulty arises. Applied a new bandiad on the right hand wound. Pt will be contacted with x-ray results once available for review.  Pt verbalized understanding and in agreement.   Today's visit is a 20 minute F2F encounter.   Medications Prescribed  No orders of the defined types were placed in this encounter.   Referred to none   Return to Work Status Regular duty. RTC in 6 days or sooner if any difficulty arises.   Provider Signature ________________________________________Date_________   Employee Signature _______________________________________Date_________   Please email this completed form to Angus Seller, Director of Risk Management at [email or web address] within 24 hours of visit.

## 2023-06-13 ENCOUNTER — Other Ambulatory Visit: Payer: Self-pay | Admitting: Physician Assistant

## 2023-06-13 ENCOUNTER — Telehealth: Payer: Self-pay | Admitting: Physician Assistant

## 2023-06-13 DIAGNOSIS — M79641 Pain in right hand: Secondary | ICD-10-CM

## 2023-06-13 DIAGNOSIS — R936 Abnormal findings on diagnostic imaging of limbs: Secondary | ICD-10-CM

## 2023-06-13 NOTE — Progress Notes (Signed)
Reviewed abnormal x-ray report of the R hand:  IMPRESSION: 1. No acute fracture identified. 2. Osteoarthritis, with advanced degenerative arthropathy at the interphalangeal joint of the thumb and distal interphalangeal joint of the index finger. There could be an erosive component at the distal interphalangeal joint of the index finger. 3. Increased scapholunate angle on the lateral projection reason the possibility of scapholunate ligament tear and dorsal intercalated segmental instability. 4. If pain persists despite conservative therapy, CT or MRI may be warranted for further characterization.   Reviewed x-ray of left second Toe:   IMPRESSION: 1. No acute fracture identified. 2. Hammertoe deformities of the second through fifth toes. 3. Interphalangeal articular space narrowing in the second through fifth toes.

## 2023-06-13 NOTE — Telephone Encounter (Signed)
Left a message to return my call to review x-ray reports

## 2023-06-13 NOTE — Telephone Encounter (Signed)
Called patient to inform her that I'm still awaiting x-ray reports from 1 week ago. She has a follow up at the clinic in 5 days.

## 2023-06-18 ENCOUNTER — Ambulatory Visit (INDEPENDENT_AMBULATORY_CARE_PROVIDER_SITE_OTHER): Payer: Self-pay | Admitting: Physician Assistant

## 2023-06-18 ENCOUNTER — Other Ambulatory Visit: Payer: Self-pay

## 2023-06-18 ENCOUNTER — Encounter: Payer: Self-pay | Admitting: Physician Assistant

## 2023-06-18 VITALS — BP 116/76 | HR 80

## 2023-06-18 DIAGNOSIS — W101XXD Fall (on)(from) sidewalk curb, subsequent encounter: Secondary | ICD-10-CM

## 2023-06-18 DIAGNOSIS — M79641 Pain in right hand: Secondary | ICD-10-CM

## 2023-06-18 DIAGNOSIS — S60511D Abrasion of right hand, subsequent encounter: Secondary | ICD-10-CM

## 2023-06-18 DIAGNOSIS — M79675 Pain in left toe(s): Secondary | ICD-10-CM

## 2023-06-18 NOTE — Progress Notes (Addendum)
[facility] 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 Unstable on uneven sidewalk and fell.    Provider Note S: 72 y/o F presents to the clinic for a f/u to fall outdoors. She initially c/o R hand and left 2nd toe pain. She has continued to have 2nd toe pain, however right hand pain is improved. Abrasions/wounds have healed well.  X-ray of R hand findings: No acute fracture, OA, but also an increased sapholunate angle on the lateral projection reason the possibility of saphoplunate ligament tear and dorsal intercalated segmental instability.  X-ray of Left 2nd toe: Hammer toe. No acute findings otherwise.   O:Right Hand: Well healed wounds on the palmar surface of the hand. +mild TTP over the volar surface of the 1st MCP joint.  Left 2nd toe: No swelling. Mild TTP.   A/P: Diagnosis  Fall on or from sidewalk curb, subsequent encounter W10.1XXD  Abrasion of right hand, subsequent encounter S60.511D  Right hand pain M79.641  Toe pain, left M79.675  I reviewed the x-ray report of R hand with patient. I will refer her to Ortho. She was given contact information for [facility] and will f/u as instructed.   Medications Prescribed  No orders of the defined types were placed in this encounter.   Referred to Ortho for further evaluation of abnormal x-ray  report of R hand    Return to Work Status Regular duty. Pt is discharged from the clinic and will f/u with Ortho as instructed.    Provider Signature ________________________________________Date_________   Employee Signature _______________________________________Date_________   Please email this completed form to Angus Seller, Director of Risk Management at [email or web address] within 24 hours of visit.

## 2023-06-19 ENCOUNTER — Other Ambulatory Visit: Payer: Self-pay | Admitting: Nurse Practitioner

## 2023-06-20 NOTE — Telephone Encounter (Signed)
Requested Prescriptions  Pending Prescriptions Disp Refills   famotidine (PEPCID) 20 MG tablet [Pharmacy Med Name: FAMOTIDINE 20 MG TAB] 90 tablet 3    Sig: TAKE 1 TABLET BY MOUTH ONCE DAILY     Gastroenterology:  H2 Antagonists Passed - 06/19/2023 10:50 AM      Passed - Valid encounter within last 12 months    Recent Outpatient Visits           1 month ago Medicare annual wellness visit, subsequent   [place] [place], [place] T, NP   5 months ago Acute cystitis with hematuria   [place] [facility] Larae Grooms, NP   7 months ago Aortic atherosclerosis [facility])   [place] [facility] [place], Corrie Dandy T, NP   1 year ago Thrombocytopenia [facility])   [facility] Aura Dials T, NP   1 year ago COVID-19   [place] [facility] Larae Grooms, NP       Future Appointments             In 2 weeks Cannady, Dorie Rank, NP [place] [facility]

## 2023-07-01 NOTE — Patient Instructions (Incomplete)
Be Involved in Caring For Your Health:  Taking Medications When medications are taken as directed, they can greatly improve your health. But if they are not taken as prescribed, they may not work. In some cases, not taking them correctly can be harmful. To help ensure your treatment remains effective and safe, understand your medications and how to take them. Bring your medications to each visit for review by your provider.  Your lab results, notes, and after visit summary will be available on My Chart. We strongly encourage you to use this feature. If lab results are abnormal the clinic will contact you with the appropriate steps. If the clinic does not contact you assume the results are satisfactory. You can always view your results on My Chart. If you have questions regarding your health or results, please contact the clinic during office hours. You can also ask questions on My Chart.  We at [facility] are grateful that you chose [place] to provide your care. We strive to provide evidence-based and compassionate care and are always looking for feedback. If you get a survey from the clinic please complete this so we can hear your opinions.  Heart-Healthy Eating Plan Many factors influence your heart health, including eating and exercise habits. Heart health is also called coronary health. Coronary risk increases with abnormal blood fat (lipid) levels. A heart-healthy eating plan includes limiting unhealthy fats, increasing healthy fats, limiting salt (sodium) intake, and making other diet and lifestyle changes. What is my plan? Your health care provider may recommend that: You limit your fat intake to _________% or less of your total calories each day. You limit your saturated fat intake to _________% or less of your total calories each day. You limit the amount of cholesterol in your diet to less than _________ mg per day. You limit the amount of sodium in your diet to less than _________  mg per day. What are tips for following this plan? Cooking Cook foods using methods other than frying. Baking, boiling, grilling, and broiling are all good options. Other ways to reduce fat include: Removing the skin from poultry. Removing all visible fats from meats. Steaming vegetables in water or broth. Meal planning  At meals, imagine dividing your plate into fourths: Fill one-half of your plate with vegetables and green salads. Fill one-fourth of your plate with whole grains. Fill one-fourth of your plate with lean protein foods. Eat 2-4 cups of vegetables per day. One cup of vegetables equals 1 cup (91 g) broccoli or cauliflower florets, 2 medium carrots, 1 large bell pepper, 1 large sweet potato, 1 large tomato, 1 medium white potato, 2 cups (150 g) raw leafy greens. Eat 1-2 cups of fruit per day. One cup of fruit equals 1 small apple, 1 large banana, 1 cup (237 g) mixed fruit, 1 large orange,  cup (82 g) dried fruit, 1 cup (240 mL) 100% fruit juice. Eat more foods that contain soluble fiber. Examples include apples, broccoli, carrots, beans, peas, and barley. Aim to get 25-30 g of fiber per day. Increase your consumption of legumes, nuts, and seeds to 4-5 servings per week. One serving of dried beans or legumes equals  cup (90 g) cooked, 1 serving of nuts is  oz (12 almonds, 24 pistachios, or 7 walnut halves), and 1 serving of seeds equals  oz (8 g). Fats Choose healthy fats more often. Choose monounsaturated and polyunsaturated fats, such as olive and canola oils, avocado oil, flaxseeds, walnuts, almonds, and seeds. Eat  more omega-3 fats. Choose salmon, mackerel, sardines, tuna, flaxseed oil, and ground flaxseeds. Aim to eat fish at least 2 times each week. Check food labels carefully to identify foods with trans fats or high amounts of saturated fat. Limit saturated fats. These are found in animal products, such as meats, butter, and cream. Plant sources of saturated fats  include palm oil, palm kernel oil, and coconut oil. Avoid foods with partially hydrogenated oils in them. These contain trans fats. Examples are stick margarine, some tub margarines, cookies, crackers, and other baked goods. Avoid fried foods. General information Eat more home-cooked food and less restaurant, buffet, and fast food. Limit or avoid alcohol. Limit foods that are high in added sugar and simple starches such as foods made using white refined flour (white breads, pastries, sweets). Lose weight if you are overweight. Losing just 5-10% of your body weight can help your overall health and prevent diseases such as diabetes and heart disease. Monitor your sodium intake, especially if you have high blood pressure. Talk with your health care provider about your sodium intake. Try to incorporate more vegetarian meals weekly. What foods should I eat? Fruits All fresh, canned (in natural juice), or frozen fruits. Vegetables Fresh or frozen vegetables (raw, steamed, roasted, or grilled). Green salads. Grains Most grains. Choose whole wheat and whole grains most of the time. Rice and pasta, including brown rice and pastas made with whole wheat. Meats and other proteins Lean, well-trimmed beef, veal, pork, and lamb. Chicken and [place] without skin. All fish and shellfish. Wild duck, rabbit, pheasant, and venison. Egg whites or low-cholesterol egg substitutes. Dried beans, peas, lentils, and tofu. Seeds and most nuts. Dairy Low-fat or nonfat cheeses, including ricotta and mozzarella. Skim or 1% milk (liquid, powdered, or evaporated). Buttermilk made with low-fat milk. Nonfat or low-fat yogurt. Fats and oils Non-hydrogenated (trans-free) margarines. Vegetable oils, including soybean, sesame, sunflower, olive, avocado, peanut, safflower, corn, canola, and cottonseed. Salad dressings or mayonnaise made with a vegetable oil. Beverages Water (mineral or sparkling). Coffee and tea. Unsweetened ice  tea. Diet beverages. Sweets and desserts Sherbet, gelatin, and fruit ice. Small amounts of dark chocolate. Limit all sweets and desserts. Seasonings and condiments All seasonings and condiments. The items listed above may not be a complete list of foods and beverages you can eat. Contact a dietitian for more options. What foods should I avoid? Fruits Canned fruit in heavy syrup. Fruit in cream or butter sauce. Fried fruit. Limit coconut. Vegetables Vegetables cooked in cheese, cream, or butter sauce. Fried vegetables. Grains Breads made with saturated or trans fats, oils, or whole milk. Croissants. Sweet rolls. Donuts. High-fat crackers, such as cheese crackers and chips. Meats and other proteins Fatty meats, such as hot dogs, ribs, sausage, bacon, rib-eye roast or steak. High-fat deli meats, such as salami and bologna. Caviar. Domestic duck and goose. Organ meats, such as liver. Dairy Cream, sour cream, cream cheese, and creamed cottage cheese. Whole-milk cheeses. Whole or 2% milk (liquid, evaporated, or condensed). Whole buttermilk. Cream sauce or high-fat cheese sauce. Whole-milk yogurt. Fats and oils Meat fat, or shortening. Cocoa butter, hydrogenated oils, palm oil, coconut oil, palm kernel oil. Solid fats and shortenings, including bacon fat, salt pork, lard, and butter. Nondairy cream substitutes. Salad dressings with cheese or sour cream. Beverages Regular sodas and any drinks with added sugar. Sweets and desserts Frosting. Pudding. Cookies. Cakes. Pies. Milk chocolate or white chocolate. Buttered syrups. Full-fat ice cream or ice cream drinks. The items listed above may  not be a complete list of foods and beverages to avoid. Contact a dietitian for more information. Summary Heart-healthy meal planning includes limiting unhealthy fats, increasing healthy fats, limiting salt (sodium) intake and making other diet and lifestyle changes. Lose weight if you are overweight. Losing just  5-10% of your body weight can help your overall health and prevent diseases such as diabetes and heart disease. Focus on eating a balance of foods, including fruits and vegetables, low-fat or nonfat dairy, lean protein, nuts and legumes, whole grains, and heart-healthy oils and fats. This information is not intended to replace advice given to you by your health care provider. Make sure you discuss any questions you have with your health care provider. Document Revised: 08/15/2021 Document Reviewed: 08/15/2021 Elsevier Patient Education  2024 [employer].

## 2023-07-03 ENCOUNTER — Ambulatory Visit (INDEPENDENT_AMBULATORY_CARE_PROVIDER_SITE_OTHER): Payer: Medicare PPO | Admitting: Nurse Practitioner

## 2023-07-03 ENCOUNTER — Encounter (INDEPENDENT_AMBULATORY_CARE_PROVIDER_SITE_OTHER): Payer: Self-pay | Admitting: Nurse Practitioner

## 2023-07-03 VITALS — BP 121/80 | HR 73 | Resp 18 | Ht 64.0 in | Wt 195.6 lb

## 2023-07-03 DIAGNOSIS — I8312 Varicose veins of left lower extremity with inflammation: Secondary | ICD-10-CM

## 2023-07-03 DIAGNOSIS — I8310 Varicose veins of unspecified lower extremity with inflammation: Secondary | ICD-10-CM

## 2023-07-03 NOTE — Progress Notes (Signed)
Varicose veins of left lower extremity with inflammation (454.1  I83.10) Current Plans   Indication: Patient presents with symptomatic varicose veins of the left lower extremity.   Procedure: Sclerotherapy using hypertonic saline mixed with 1% Lidocaine was performed on the left lower extremity. Compression wraps were placed. The patient tolerated the procedure well. 

## 2023-07-05 ENCOUNTER — Encounter: Payer: Self-pay | Admitting: Nurse Practitioner

## 2023-07-05 ENCOUNTER — Ambulatory Visit: Payer: Medicare PPO | Admitting: Nurse Practitioner

## 2023-07-05 VITALS — BP 123/78 | HR 76 | Temp 97.5°F | Wt 199.8 lb

## 2023-07-05 DIAGNOSIS — S62024A Nondisplaced fracture of middle third of navicular [scaphoid] bone of right wrist, initial encounter for closed fracture: Secondary | ICD-10-CM | POA: Insufficient documentation

## 2023-07-05 DIAGNOSIS — M25531 Pain in right wrist: Secondary | ICD-10-CM | POA: Insufficient documentation

## 2023-07-05 DIAGNOSIS — R079 Chest pain, unspecified: Secondary | ICD-10-CM

## 2023-07-05 NOTE — Assessment & Plan Note (Signed)
On and off for 6 months reported with occasional palpitations.  No other symptoms.  Recent EKG and Zio monitor overall reassuring.  Recommend she continue ASA 81 MG.  Educated her on red flag symptoms and aware to go to ER if these present.  Will obtain echo to further assess heart structure and obtain chest x-ray.  If these are overall reassuring and she continue to have discomfort will send to cardiology.  Discussed this plan with her at length.

## 2023-07-05 NOTE — Assessment & Plan Note (Signed)
Due to fall at work on 06/05/23 with fracture present, having trouble getting CT wrist via Workmen's Comp.  She wishes to have PCP order this instead so ortho can review.  Continue collaboration with ortho and maintain brace at this time.

## 2023-07-05 NOTE — Progress Notes (Signed)
BP 123/78   Pulse 76   Temp (!) 97.5 F (36.4 C) (Oral)   Wt 199 lb 12.8 oz (90.6 kg)   LMP  (LMP Unknown)   SpO2 98%   BMI 34.30 kg/m    Subjective:    Patient ID: Patricia Ferguson, female    DOB: Dec 10, 1950, 72 y.o.   MRN: [id number]  HPI: Patricia Ferguson is a 72 y.o. female  Chief Complaint  Patient presents with   Chest Discomfort    8 week f/up- patient states the feeling is off and on. Wonders if it is indigestion   Fall    Patient states she had a fall on 06/05/23 while at work and hurt her R wrist. States she is doing workers comp, but is very frustrated with this. States she finally had an xray done 2 weeks later and found out something was broken. States she was told a CT was going to be orders but she has not heard anything regarding having this done. Wants to discuss [place] ordering this for her.    CHEST DISCOMFORT Follow-up today for chest discomfort reported on 05/11/23.  EKG in office at time was reassuring.  Ordered 14 day Zio which was overall reassuring with her pain being present during normal periods + 2 episodes of SVT which were nonsustained.  No arrhythmias and a-fib.  She reports some ongoing discomfort occasionally, more on left side.  These episodes last anywhere from 3 to 10 minutes.  Sometimes it is in morning when wakes up, she wonders if it is more muscle related.  Does not notice it after eating.  Has about one episode a week. Time since onset: 6 months Onset: gradual Quality: aching and pressure-like Severity: 2/10 Location: upper left Radiation: none Episode duration: 3 to 10 minutes Frequency: intermittent Related to exertion: no Activity when pain started: unknown Trauma: no Anxiety/recent stressors: no Aggravating factors: taking a deep breath at times Alleviating factors: it works its way out  Status: stable Treatments attempted: nothing -- does take Pepcid at baseline Current pain status: pain free Shortness of breath: no Cough: yes,  non-productive Nausea: no Diaphoresis: no Heartburn: no Palpitations: occasional flutter  WRIST PAIN (RIGHT) Had a fall at work, [place], on 06/05/23.  She applied for [employer], but is frustrated with time it is taking to get treated and assessed.  Did go to [facility] and was told she had a fracture, they recommended she obtain a CT scan to further look at tendons.  She is currently wearing brace.  Has been a month and no CT performed due to waiting on approval from [employer].  She is having numbness along thumb line to wrist.  When fell she fell forward on her right wrist, concrete. Dominant hand. Duration: weeks Involved wrist: right Mechanism of injury:   fall at work Location: medial Onset: gradual Severity: 3/10  Quality:  dull, aching, and throbbing Frequency: intermittent Radiation: no Aggravating factors: nothing  Alleviating factors: APAP, NSAIDs, brace, and rest  Status: stable Treatments attempted: rest, APAP, and ibuprofen, brace Relief with NSAIDs?:  moderate Weakness: yes Numbness: yes Median nerve distribution Redness: no Bruising: no Swelling: no Fevers: no   Relevant past medical, surgical, family and social history reviewed and updated as indicated. Interim medical history since our last visit reviewed. Allergies and medications reviewed and updated.  Review of Systems  Constitutional:  Negative for activity change, appetite change, diaphoresis, fatigue and fever.  Respiratory:  Positive for chest  tightness (occasional pressure discomfort to left upper). Negative for cough, shortness of breath and wheezing.   Cardiovascular:  Negative for palpitations and leg swelling.  Gastrointestinal: Negative.   Endocrine: Negative for cold intolerance and heat intolerance.  Musculoskeletal:  Positive for arthralgias.  Neurological: Negative.   Hematological: Negative.     Per HPI unless specifically indicated above     Objective:    BP 123/78    Pulse 76   Temp (!) 97.5 F (36.4 C) (Oral)   Wt 199 lb 12.8 oz (90.6 kg)   LMP  (LMP Unknown)   SpO2 98%   BMI 34.30 kg/m   Wt Readings from Last 3 Encounters:  07/05/23 199 lb 12.8 oz (90.6 kg)  07/03/23 195 lb 9.6 oz (88.7 kg)  06/06/23 199 lb (90.3 kg)    Physical Exam Vitals and nursing note reviewed.  Constitutional:      General: She is awake. She is not in acute distress.    Appearance: She is well-developed and well-groomed. She is obese. She is not ill-appearing or toxic-appearing.  HENT:     Head: Normocephalic.     Right Ear: Hearing and external ear normal.     Left Ear: Hearing and external ear normal.  Eyes:     General: Lids are normal.        Right eye: No discharge.        Left eye: No discharge.     Conjunctiva/sclera: Conjunctivae normal.     Pupils: Pupils are equal, round, and reactive to light.  Neck:     Thyroid: No thyromegaly.     Vascular: No carotid bruit.  Cardiovascular:     Rate and Rhythm: Normal rate and regular rhythm.     Heart sounds: Normal heart sounds. No murmur heard.    No gallop.  Pulmonary:     Effort: Pulmonary effort is normal. No accessory muscle usage or respiratory distress.     Breath sounds: Normal breath sounds.  Abdominal:     General: Bowel sounds are normal. There is no distension.     Palpations: Abdomen is soft.     Tenderness: There is no abdominal tenderness.  Musculoskeletal:     Left wrist: Normal.     Cervical back: Normal range of motion and neck supple.     Right lower leg: No edema.     Left lower leg: No edema.     Comments: Right wrist with brace in place per ortho.  Lymphadenopathy:     Cervical: No cervical adenopathy.  Skin:    General: Skin is warm and dry.  Neurological:     Mental Status: She is alert and oriented to person, place, and time.     Deep Tendon Reflexes: Reflexes are normal and symmetric.     Reflex Scores:      Brachioradialis reflexes are 2+ on the right side and 2+ on the  left side.      Patellar reflexes are 2+ on the right side and 2+ on the left side. Psychiatric:        Attention and Perception: Attention normal.        Mood and Affect: Mood normal.        Speech: Speech normal.        Behavior: Behavior normal. Behavior is cooperative.        Thought Content: Thought content normal.     Results for orders placed or performed in visit on 05/11/23  Urinalysis, Routine  w reflex microscopic   Collection Time: 05/11/23  9:43 AM  Result Value Ref Range   Specific Gravity, UA 1.015 1.005 - 1.030   pH, UA 7.0 5.0 - 7.5   Color, UA Yellow Yellow   Appearance Ur Clear Clear   Leukocytes,UA Negative Negative   Protein,UA Negative Negative/Trace   Glucose, UA Negative Negative   Ketones, UA Negative Negative   RBC, UA Negative Negative   Bilirubin, UA Negative Negative   Urobilinogen, Ur 1.0 0.2 - 1.0 mg/dL   Nitrite, UA Negative Negative   Microscopic Examination Comment   CBC with Differential/Platelet   Collection Time: 05/11/23  9:45 AM  Result Value Ref Range   WBC 5.2 3.4 - 10.8 x10E3/uL   RBC 4.61 3.77 - 5.28 x10E6/uL   Hemoglobin 13.8 11.1 - 15.9 g/dL   Hematocrit 16.1 09.6 - 46.6 %   MCV 92 79 - 97 fL   MCH 29.9 26.6 - 33.0 pg   MCHC 32.5 31.5 - 35.7 g/dL   RDW 04.5 40.9 - 81.1 %   Platelets 178 150 - 450 x10E3/uL   Neutrophils 54 Not Estab. %   Lymphs 33 Not Estab. %   Monocytes 9 Not Estab. %   Eos 2 Not Estab. %   Basos 2 Not Estab. %   Neutrophils Absolute 2.8 1.4 - 7.0 x10E3/uL   Lymphocytes Absolute 1.7 0.7 - 3.1 x10E3/uL   Monocytes Absolute 0.5 0.1 - 0.9 x10E3/uL   EOS (ABSOLUTE) 0.1 0.0 - 0.4 x10E3/uL   Basophils Absolute 0.1 0.0 - 0.2 x10E3/uL   Immature Granulocytes 0 Not Estab. %   Immature Grans (Abs) 0.0 0.0 - 0.1 x10E3/uL  Comprehensive metabolic panel   Collection Time: 05/11/23  9:45 AM  Result Value Ref Range   Glucose 87 70 - 99 mg/dL   BUN 12 8 - 27 mg/dL   Creatinine, Ser 9.14 0.57 - 1.00 mg/dL   eGFR 92  >78 GN/FAO/1.30   BUN/Creatinine Ratio 17 12 - 28   Sodium 143 134 - 144 mmol/L   Potassium 4.2 3.5 - 5.2 mmol/L   Chloride 102 96 - 106 mmol/L   CO2 26 20 - 29 mmol/L   Calcium 9.2 8.7 - 10.3 mg/dL   Total Protein 6.8 6.0 - 8.5 g/dL   Albumin 4.4 3.8 - 4.8 g/dL   Globulin, Total 2.4 1.5 - 4.5 g/dL   Bilirubin Total 1.0 0.0 - 1.2 mg/dL   Alkaline Phosphatase 77 44 - 121 IU/L   AST 23 0 - 40 IU/L   ALT 16 0 - 32 IU/L  Lipid Panel w/o Chol/HDL Ratio   Collection Time: 05/11/23  9:45 AM  Result Value Ref Range   Cholesterol, Total 156 100 - 199 mg/dL   Triglycerides 92 0 - 149 mg/dL   HDL 57 >86 mg/dL   VLDL Cholesterol Cal 17 5 - 40 mg/dL   LDL Chol Calc (NIH) 82 0 - 99 mg/dL  TSH   Collection Time: 05/11/23  9:45 AM  Result Value Ref Range   TSH 2.430 0.450 - 4.500 uIU/mL  Vitamin B12   Collection Time: 05/11/23  9:45 AM  Result Value Ref Range   Vitamin B-12 425 232 - 1,245 pg/mL  VITAMIN D 25 Hydroxy (Vit-D Deficiency, Fractures)   Collection Time: 05/11/23  9:45 AM  Result Value Ref Range   Vit D, 25-Hydroxy 34.2 30.0 - 100.0 ng/mL      Assessment & Plan:   Problem List Items Addressed  This Visit       Musculoskeletal and Integument   Closed nondisplaced fracture of middle third of navicular bone of right wrist   At work on 06/05/23, having trouble getting CT wrist via Workmen's Comp.  She wishes to have PCP order this instead so ortho can review.  Continue collaboration with ortho and maintain brace at this time.      Relevant Orders   CT WRIST RIGHT WO CONTRAST     Other   Chest pain - Primary   On and off for 6 months reported with occasional palpitations.  No other symptoms.  Recent EKG and Zio monitor overall reassuring.  Recommend she continue ASA 81 MG.  Educated her on red flag symptoms and aware to go to ER if these present.  Will obtain echo to further assess heart structure and obtain chest x-ray.  If these are overall reassuring and she continue to  have discomfort will send to cardiology.  Discussed this plan with her at length.      Relevant Orders   DG Chest 2 View   ECHOCARDIOGRAM COMPLETE   Right wrist pain   Due to fall at work on 06/05/23 with fracture present, having trouble getting CT wrist via Workmen's Comp.  She wishes to have PCP order this instead so ortho can review.  Continue collaboration with ortho and maintain brace at this time.      Relevant Orders   CT WRIST RIGHT WO CONTRAST     Follow up plan: Return in about 5 months (around 12/03/2023) for HTN/HLD.

## 2023-07-05 NOTE — Assessment & Plan Note (Signed)
At work on 06/05/23, having trouble getting CT wrist via Workmen's Comp.  She wishes to have PCP order this instead so ortho can review.  Continue collaboration with ortho and maintain brace at this time.

## 2023-07-06 ENCOUNTER — Ambulatory Visit: Payer: Medicare PPO | Admitting: Nurse Practitioner

## 2023-07-08 DIAGNOSIS — E785 Hyperlipidemia, unspecified: Secondary | ICD-10-CM | POA: Diagnosis not present

## 2023-07-08 DIAGNOSIS — G2581 Restless legs syndrome: Secondary | ICD-10-CM | POA: Diagnosis not present

## 2023-07-08 DIAGNOSIS — M8580 Other specified disorders of bone density and structure, unspecified site: Secondary | ICD-10-CM | POA: Diagnosis not present

## 2023-07-08 DIAGNOSIS — R32 Unspecified urinary incontinence: Secondary | ICD-10-CM | POA: Diagnosis not present

## 2023-07-08 DIAGNOSIS — M1990 Unspecified osteoarthritis, unspecified site: Secondary | ICD-10-CM | POA: Diagnosis not present

## 2023-07-08 DIAGNOSIS — K219 Gastro-esophageal reflux disease without esophagitis: Secondary | ICD-10-CM | POA: Diagnosis not present

## 2023-07-08 DIAGNOSIS — E669 Obesity, unspecified: Secondary | ICD-10-CM | POA: Diagnosis not present

## 2023-07-08 DIAGNOSIS — I129 Hypertensive chronic kidney disease with stage 1 through stage 4 chronic kidney disease, or unspecified chronic kidney disease: Secondary | ICD-10-CM | POA: Diagnosis not present

## 2023-07-08 DIAGNOSIS — I700 Atherosclerosis of aorta: Secondary | ICD-10-CM | POA: Diagnosis not present

## 2023-07-09 ENCOUNTER — Ambulatory Visit
Admission: RE | Admit: 2023-07-09 | Discharge: 2023-07-09 | Disposition: A | Discharge status: Home / Self Care | Payer: Medicare PPO | Attending: Nurse Practitioner | Admitting: Nurse Practitioner

## 2023-07-09 ENCOUNTER — Ambulatory Visit
Admission: RE | Admit: 2023-07-09 | Discharge: 2023-07-09 | Disposition: A | Discharge status: Home / Self Care | Payer: Medicare PPO | Source: Ambulatory Visit | Attending: Nurse Practitioner | Admitting: Nurse Practitioner

## 2023-07-09 DIAGNOSIS — R079 Chest pain, unspecified: Secondary | ICD-10-CM | POA: Insufficient documentation

## 2023-07-09 DIAGNOSIS — R0789 Other chest pain: Secondary | ICD-10-CM | POA: Diagnosis not present

## 2023-07-10 ENCOUNTER — Telehealth: Payer: Self-pay | Admitting: Nurse Practitioner

## 2023-07-10 NOTE — Telephone Encounter (Signed)
Dawn from the pre service center calling to advise the pt's CT w/o contrast scheduled for Tomorrow, wed 12/18 needs to be authorized.   Any questions, call Dawn at: [phone]  ext [phone]

## 2023-07-11 ENCOUNTER — Ambulatory Visit
Admission: RE | Admit: 2023-07-11 | Discharge: 2023-07-11 | Disposition: A | Discharge status: Home / Self Care | Payer: Medicare PPO | Source: Ambulatory Visit | Attending: Nurse Practitioner | Admitting: Nurse Practitioner

## 2023-07-11 DIAGNOSIS — M25531 Pain in right wrist: Secondary | ICD-10-CM | POA: Insufficient documentation

## 2023-07-11 DIAGNOSIS — S62024A Nondisplaced fracture of middle third of navicular [scaphoid] bone of right wrist, initial encounter for closed fracture: Secondary | ICD-10-CM | POA: Diagnosis not present

## 2023-07-11 DIAGNOSIS — M1811 Unilateral primary osteoarthritis of first carpometacarpal joint, right hand: Secondary | ICD-10-CM | POA: Diagnosis not present

## 2023-07-12 ENCOUNTER — Other Ambulatory Visit: Payer: Self-pay | Admitting: Nurse Practitioner

## 2023-07-12 NOTE — Telephone Encounter (Signed)
Requested Prescriptions  Pending Prescriptions Disp Refills   ezetimibe (ZETIA) 10 MG tablet [Pharmacy Med Name: EZETIMIBE 10 MG TAB] 90 tablet 3    Sig: TAKE 1 TABLET BY MOUTH ONCE DAILY     Cardiovascular:  Antilipid - Sterol Transport Inhibitors Failed - 07/12/2023 11:19 AM      Failed - Lipid Panel in normal range within the last 12 months    Cholesterol, Total  Date Value Ref Range Status  05/11/2023 156 100 - 199 mg/dL Final   LDL Chol Calc (NIH)  Date Value Ref Range Status  05/11/2023 82 0 - 99 mg/dL Final   HDL  Date Value Ref Range Status  05/11/2023 57 >39 mg/dL Final   Triglycerides  Date Value Ref Range Status  05/11/2023 92 0 - 149 mg/dL Final         Passed - AST in normal range and within 360 days    AST  Date Value Ref Range Status  05/11/2023 23 0 - 40 IU/L Final         Passed - ALT in normal range and within 360 days    ALT  Date Value Ref Range Status  05/11/2023 16 0 - 32 IU/L Final         Passed - Patient is not pregnant      Passed - Valid encounter within last 12 months    Recent Outpatient Visits           1 week ago Chest pain, unspecified type   [place] [facility] [place], Corrie Dandy T, NP   2 months ago Medicare annual wellness visit, subsequent   [place] [facility] [place], [place] T, NP   6 months ago Acute cystitis with hematuria   [place] [facility] Larae Grooms, NP   8 months ago Aortic atherosclerosis (HCC)   [place] [facility] [place], Corrie Dandy T, NP   1 year ago Thrombocytopenia [facility])   [place] [facility] [place], Dorie Rank, NP       Future Appointments             In 4 months [place], Dorie Rank, NP  [facility]

## 2023-07-23 NOTE — Progress Notes (Signed)
Contacted via MyChart   Good morning Sharnice, your wrist and chest imaging came back with no acute findings.  That is good news.  I would say if wrist continues to hurt we get you into ortho for further check. Any questions?

## 2023-07-24 ENCOUNTER — Ambulatory Visit (INDEPENDENT_AMBULATORY_CARE_PROVIDER_SITE_OTHER): Payer: Medicare PPO | Admitting: Nurse Practitioner

## 2023-07-24 ENCOUNTER — Encounter (INDEPENDENT_AMBULATORY_CARE_PROVIDER_SITE_OTHER): Payer: Self-pay | Admitting: Nurse Practitioner

## 2023-07-24 VITALS — BP 134/83 | HR 73 | Resp 18 | Ht 64.0 in | Wt 199.0 lb

## 2023-07-24 DIAGNOSIS — I8310 Varicose veins of unspecified lower extremity with inflammation: Secondary | ICD-10-CM

## 2023-07-24 DIAGNOSIS — I8312 Varicose veins of left lower extremity with inflammation: Secondary | ICD-10-CM | POA: Diagnosis not present

## 2023-07-25 NOTE — Progress Notes (Signed)
Varicose veins of left lower extremity with inflammation (454.1  I83.10) Current Plans   Indication: Patient presents with symptomatic varicose veins of the left lower extremity.   Procedure: Sclerotherapy using hypertonic saline mixed with 1% Lidocaine was performed on the left lower extremity. Compression wraps were placed. The patient tolerated the procedure well. 

## 2023-07-30 ENCOUNTER — Other Ambulatory Visit: Payer: Self-pay | Admitting: Nurse Practitioner

## 2023-07-30 DIAGNOSIS — Z1231 Encounter for screening mammogram for malignant neoplasm of breast: Secondary | ICD-10-CM

## 2023-08-07 DIAGNOSIS — M1811 Unilateral primary osteoarthritis of first carpometacarpal joint, right hand: Secondary | ICD-10-CM | POA: Diagnosis not present

## 2023-08-07 DIAGNOSIS — M654 Radial styloid tenosynovitis [de Quervain]: Secondary | ICD-10-CM | POA: Diagnosis not present

## 2023-08-10 ENCOUNTER — Ambulatory Visit
Admission: RE | Admit: 2023-08-10 | Discharge: 2023-08-10 | Disposition: A | Discharge status: Home / Self Care | Payer: Medicare PPO | Source: Ambulatory Visit | Attending: Nurse Practitioner | Admitting: Nurse Practitioner

## 2023-08-10 ENCOUNTER — Encounter: Payer: Self-pay | Admitting: Nurse Practitioner

## 2023-08-10 DIAGNOSIS — I517 Cardiomegaly: Secondary | ICD-10-CM | POA: Insufficient documentation

## 2023-08-10 DIAGNOSIS — I340 Nonrheumatic mitral (valve) insufficiency: Secondary | ICD-10-CM | POA: Insufficient documentation

## 2023-08-10 DIAGNOSIS — Z79899 Other long term (current) drug therapy: Secondary | ICD-10-CM | POA: Diagnosis not present

## 2023-08-10 DIAGNOSIS — K219 Gastro-esophageal reflux disease without esophagitis: Secondary | ICD-10-CM | POA: Diagnosis not present

## 2023-08-10 DIAGNOSIS — R079 Chest pain, unspecified: Secondary | ICD-10-CM | POA: Insufficient documentation

## 2023-08-10 LAB — ECHOCARDIOGRAM COMPLETE
AR max vel: 2.82 cm2
AV Area VTI: 3.29 cm2
AV Area mean vel: 2.87 cm2
AV Mean grad: 2 mm[Hg]
AV Peak grad: 4.4 mm[Hg]
Ao pk vel: 1.05 m/s
Area-P 1/2: 3.76 cm2
MV VTI: 2.86 cm2
S' Lateral: 2.7 cm

## 2023-08-10 NOTE — Progress Notes (Signed)
Contacted via MyChart   Good afternoon Patricia Ferguson, your echo has returned and overall is stable.  The pump of heart is good.  There is mild increase in size of left ventricular which can be found as we age at times. Continue current cholesterol lowering medications. One valve, mitral, has mild back flow of blood, but this does not need intervention.  Overall echo is good.  Any questions?

## 2023-08-10 NOTE — Progress Notes (Signed)
*  PRELIMINARY RESULTS* Echocardiogram 2D Echocardiogram has been performed.  Cristela Blue 08/10/2023, 10:42 AM

## 2023-08-13 ENCOUNTER — Ambulatory Visit
Admission: RE | Admit: 2023-08-13 | Discharge: 2023-08-13 | Disposition: A | Discharge status: Home / Self Care | Payer: Medicare PPO | Source: Ambulatory Visit | Attending: Nurse Practitioner | Admitting: Nurse Practitioner

## 2023-08-13 DIAGNOSIS — Z1231 Encounter for screening mammogram for malignant neoplasm of breast: Secondary | ICD-10-CM | POA: Insufficient documentation

## 2023-08-14 ENCOUNTER — Encounter: Payer: Self-pay | Admitting: Nurse Practitioner

## 2023-08-14 NOTE — Progress Notes (Signed)
Contacted via MyChart   Normal mammogram, may repeat in one year:)

## 2023-08-17 DIAGNOSIS — M25511 Pain in right shoulder: Secondary | ICD-10-CM | POA: Diagnosis not present

## 2023-08-23 DIAGNOSIS — M25511 Pain in right shoulder: Secondary | ICD-10-CM | POA: Diagnosis not present

## 2023-08-28 ENCOUNTER — Encounter (INDEPENDENT_AMBULATORY_CARE_PROVIDER_SITE_OTHER): Payer: Self-pay | Admitting: Nurse Practitioner

## 2023-08-28 ENCOUNTER — Ambulatory Visit (INDEPENDENT_AMBULATORY_CARE_PROVIDER_SITE_OTHER): Payer: Medicare PPO | Admitting: Nurse Practitioner

## 2023-08-28 VITALS — BP 112/72 | HR 72 | Resp 18 | Ht 64.0 in | Wt 203.0 lb

## 2023-08-28 DIAGNOSIS — I8310 Varicose veins of unspecified lower extremity with inflammation: Secondary | ICD-10-CM | POA: Diagnosis not present

## 2023-08-28 DIAGNOSIS — E782 Mixed hyperlipidemia: Secondary | ICD-10-CM | POA: Diagnosis not present

## 2023-08-28 NOTE — Progress Notes (Signed)
 Subjective:    Patient ID: Patricia Ferguson, female    DOB: [birth year], 73 y.o.   MRN: [id number] No chief complaint on file.   The patient returns for followup evaluation The patient continues to have pain in the lower extremities with dependency. The pain is lessened with elevation. Graduated compression stockings, Class I (20-30 mmHg), have been worn but the stockings do not eliminate the leg pain.  The more concerning nature the patient has now had 2 episodes of bleeding from a cluster of reticular varicosities noted in the left lateral calf. She has had some sclerotherapy but the veins persist and are tender as well.    Over-the-counter analgesics do not improve the symptoms. The degree of discomfort continues to interfere with daily activities. The patient notes the pain in the legs is causing problems with daily exercise, at the workplace and even with household activities and maintenance such as standing in the kitchen preparing meals and doing dishes.      Review of Systems  All other systems reviewed and are negative.      Objective:   Physical Exam Vitals reviewed.  HENT:     Head: Normocephalic.  Cardiovascular:     Rate and Rhythm: Normal rate.     Pulses: Normal pulses.  Pulmonary:     Effort: Pulmonary effort is normal.  Musculoskeletal:        General: Tenderness present.  Skin:    General: Skin is warm and dry.  Neurological:     Mental Status: She is alert and oriented to person, place, and time.  Psychiatric:        Mood and Affect: Mood normal.        Behavior: Behavior normal.        Thought Content: Thought content normal.        Judgment: Judgment normal.     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    Lives with husband.   Social Drivers of Corporate investment banker Strain: Low Risk  (07/01/2023)   Overall Financial Resource Strain (CARDIA)    Difficulty of Paying Living Expenses: Not hard at all  Food Insecurity: No Food Insecurity (07/01/2023)   Hunger Vital Sign    Worried About Running Out of Food in the Last Year: Never true    Ran Out of Food in  the Last Year: Never true  Transportation Needs: No Transportation Needs (07/01/2023)   PRAPARE - Administrator, Civil Service (Medical): No    Lack of Transportation (Non-Medical): No  Physical Activity: Insufficiently Active (07/01/2023)   Exercise Vital Sign    Days of Exercise per Week: 3 days    Minutes of Exercise per Session: 40 min  Stress: No Stress Concern Present (07/01/2023)   [employer] of Occupational Health - Occupational Stress Questionnaire    Feeling of Stress : Only a little  Social Connections: Socially Integrated (07/01/2023)   Social Connection and Isolation Panel [NHANES]    Frequency of Communication with Friends and Family: More than three times a week    Frequency of Social Gatherings with Friends and Family: Twice a week    Attends Religious Services: 1 to 4 times per year    Active Member of [employer] or Organizations: Yes    Attends  Engineer, structural: More than 4 times per year    Marital Status: Married  Catering manager Violence: Not At Risk (05/11/2023)   Humiliation, Afraid, Rape, and Kick questionnaire    Fear of Current or Ex-Partner: No    Emotionally Abused: No    Physically Abused: No    Sexually Abused: No    Past Surgical History:  Procedure Laterality Date   ABDOMINAL HYSTERECTOMY     ANAL FISSURE REPAIR  1980's   APPENDECTOMY     BACK SURGERY  2000   lumbar fusion. SCREWS IN BACK   bone spur     BREAST BIOPSY Right 1980's   BREAST SURGERY     CERVICAL CONE BIOPSY     COLONOSCOPY WITH PROPOFOL  N/A 06/02/2022   Procedure: COLONOSCOPY WITH PROPOFOL ;  Surgeon: Luke Salaam, MD;  Location: [facility] END[facility]COPY;  Service: Gastroenterology;  Laterality: N/A;   HAMMER TOE SURGERY Right 02/01/2023   lumbar fission     MASS EXCISION Left 01/07/2020   Procedure: EXCISION MASS (RESECTION OF ANTECUBITAL VENOUS ANEURYSM);  Surgeon: Jackquelyn Mass, MD;  Location: [facility] ORS;  Service: Vascular;  Laterality: Left;   SPINE SURGERY  2003    Family History  Problem Relation Age of Onset   Diabetes Mother    Hyperlipidemia Mother    Hypertension Mother    Osteoporosis Mother    Arthritis Mother    Cancer Father        lung   Diabetes Father    Diabetes Sister    Hyperlipidemia Sister    Hypertension Sister    Hypertension Brother    Hyperlipidemia Sister    Breast cancer Neg Hx     Allergies  Allergen Reactions   Meloxicam  Other (See Comments)    Face got flushed and red.   Adhesive [Tape] Rash    PAPER TAPE OKAY   Lipitor [Atorvastatin] Other (See Comments)    Muscle aches   Oxycodone Rash   Percodan [Oxycodone-Aspirin] Rash   Pravachol [Pravastatin Sodium] Other (See Comments)    Muscle aches    Propoxyphene Other (See Comments)    depression       Latest Ref Rng & Units 05/11/2023    9:45 AM 05/09/2022    8:58 AM 01/16/2022    9:09 AM  CBC  WBC 3.4 - 10.8 x10E3/uL 5.2  4.3   4.2   Hemoglobin 11.1 - 15.9 g/dL 62.3  76.2  83.1   Hematocrit 34.0 - 46.6 % 42.5  38.4  36.1   Platelets 150 -  450 x10E3/uL 178  159  159       CMP     Component Value Date/Time   NA 143 05/11/2023 0945   K 4.2 05/11/2023 0945   CL 102 05/11/2023 0945   CO2 26 05/11/2023 0945   GLUCOSE 87 05/11/2023 0945   GLUCOSE 90 01/05/2020 0818   BUN 12 05/11/2023 0945   CREATININE 0.70 05/11/2023 0945   CALCIUM  9.2 05/11/2023 0945   PROT 6.8 05/11/2023 0945   ALBUMIN 4.4 05/11/2023 0945   AST 23 05/11/2023 0945   ALT 16 05/11/2023 0945   ALKPHOS 77 05/11/2023 0945   BILITOT 1.0 05/11/2023 0945   EGFR 92 05/11/2023 0945   GFRNONAA 90 05/05/2020 0944     No results found.     Assessment & Plan:   1. Varicose veins with inflammation (Primary) Recommend:  The patient has had successful ablation of the previously incompetent saphenous venous system but still has persistent symptoms of pain and swelling that are having a negative impact on daily life and daily activities.The patient is CEAP [id number]   Patient should undergo injection sclerotherapy to treat the residual varicosities.  The risks, benefits and alternative therapies were reviewed in detail with the patient.  All questions were answered.  The patient agrees to proceed with sclerotherapy at their convenience.  The patient will continue wearing the graduated compression stockings and using the over-the-counter pain medications to treat her symptoms.      2. Mixed hyperlipidemia Continue statin as ordered and reviewed, no changes at this time   Current Outpatient Medications on File Prior to Visit  Medication Sig Dispense Refill   Cranberry 4200 MG CAPS Take 1 capsule by mouth 2 (two) times daily.     ezetimibe  (ZETIA ) 10 MG tablet TAKE 1 TABLET BY MOUTH ONCE DAILY 90 tablet 3   famotidine  (PEPCID ) 20 MG tablet TAKE 1 TABLET BY MOUTH ONCE DAILY 90 tablet 3   gabapentin  (NEURONTIN ) 100 MG capsule Take 2 capsules  (200 mg total) by mouth at bedtime. (Patient taking differently: Take 100 mg by mouth at bedtime.) 180 capsule 0   hydroxypropyl methylcellulose / hypromellose (ISOPTO TEARS / GONIOVISC) 2.5 % ophthalmic solution Place 1 drop into both eyes daily.     Magnesium 250 MG TABS Take 1 tablet by mouth daily.     rosuvastatin  (CRESTOR ) 20 MG tablet TAKE 1 TABLET BY MOUTH THREE TIMES A WEEK (MON WEDNESDAY AND FRIDAY) 36 tablet 4   vitamin B-12 (CYANOCOBALAMIN ) 500 MCG tablet Take 500 mcg by mouth. Most days     Vitamin D , Cholecalciferol, 25 MCG (1000 UT) TABS Take 1 tablet by mouth daily.     No current facility-administered medications on file prior to visit.    There are no Patient Instructions on file for this visit. No follow-ups on file.   Lonzie Simmer E Nakeeta Sebastiani, NP

## 2023-08-31 DIAGNOSIS — M75101 Unspecified rotator cuff tear or rupture of right shoulder, not specified as traumatic: Secondary | ICD-10-CM | POA: Diagnosis not present

## 2023-08-31 DIAGNOSIS — M19011 Primary osteoarthritis, right shoulder: Secondary | ICD-10-CM | POA: Diagnosis not present

## 2023-09-02 IMAGING — DX DG LUMBAR SPINE COMPLETE 4+V
5 series · 5 of 5 positions shown · non-contrast
Comparison: Hip series 09/16/2021. CT 10/21/2014. Lumbar spine
series report 12/13/2020.

CLINICAL DATA: Left leg cramps.  History of lumbar fusion.

EXAM:
LUMBAR SPINE - COMPLETE 4+ VIEW

[l-spine obl (1 of 2)]
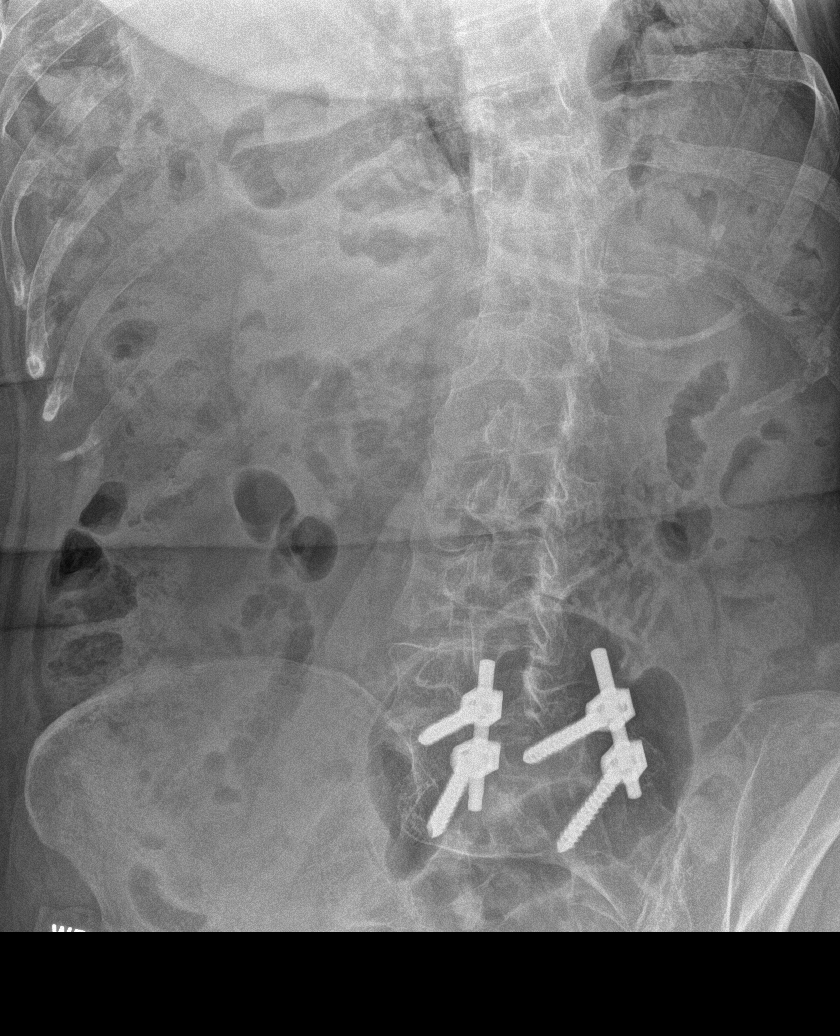

[l-spine obl (2 of 2)]
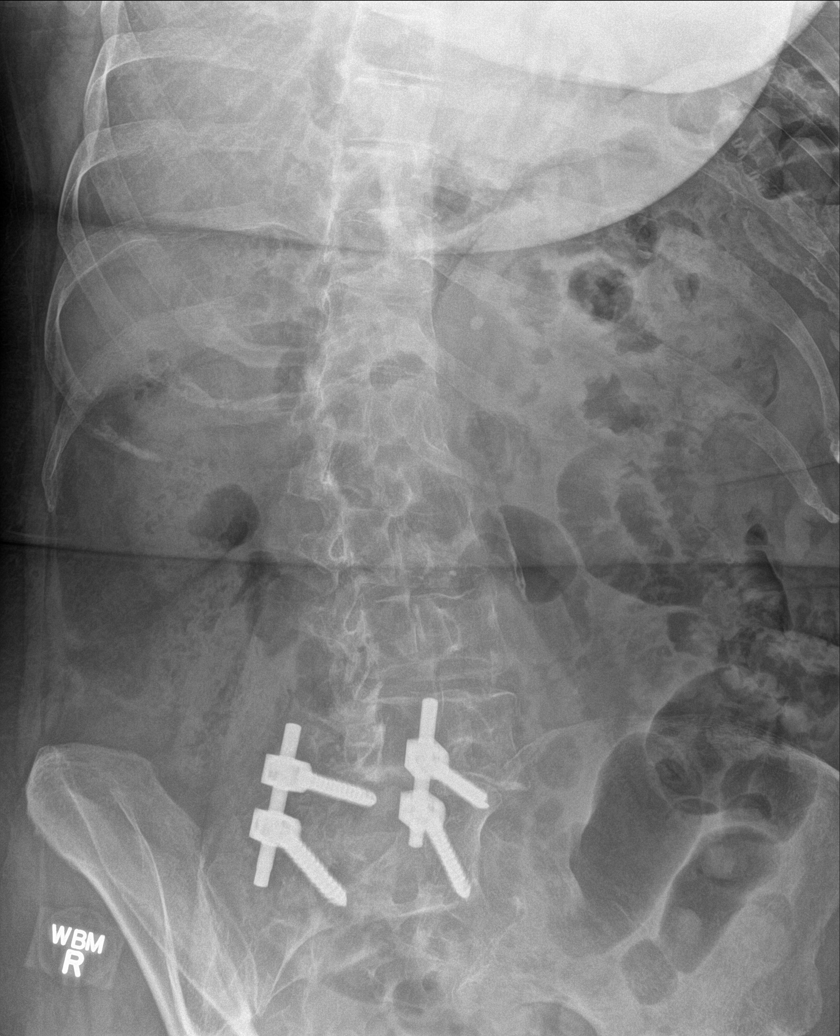

[l-spine spot]
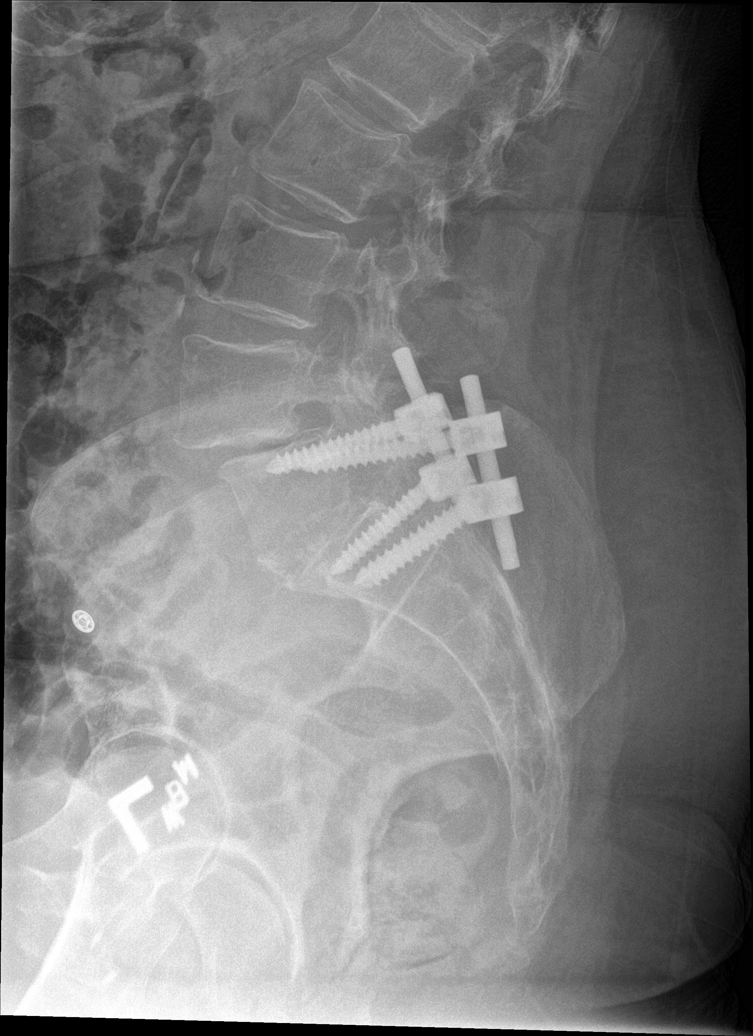

[l-spine ap]
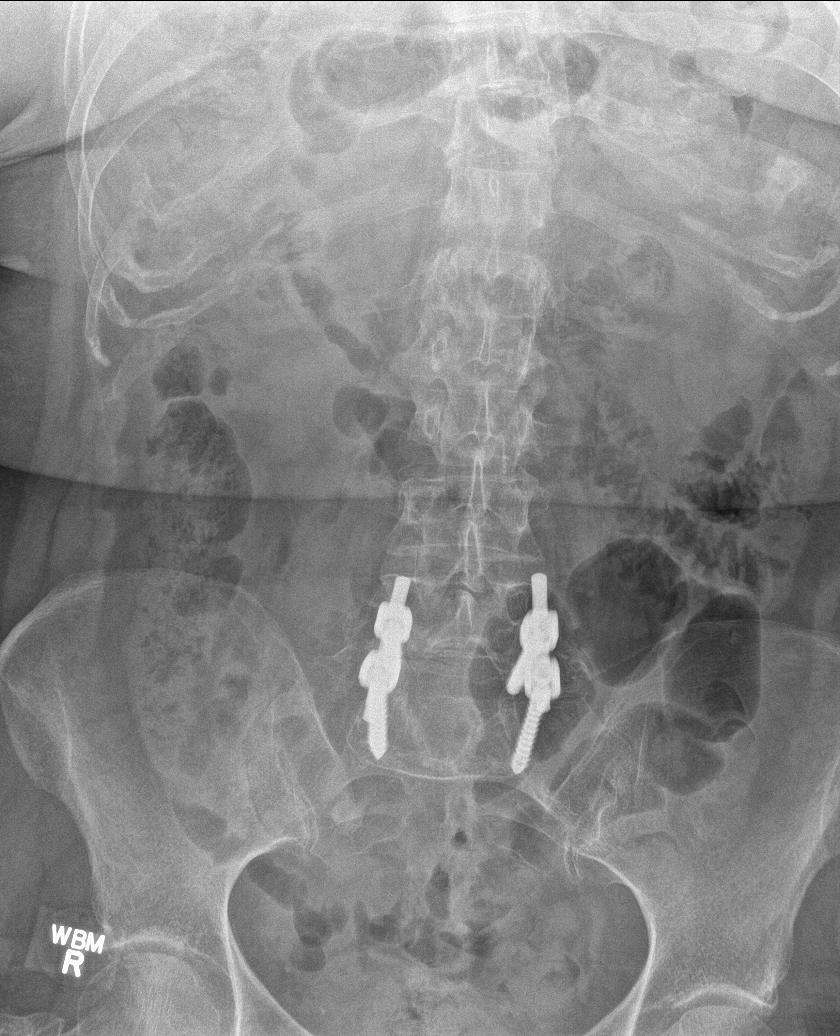

[l-spine lat]
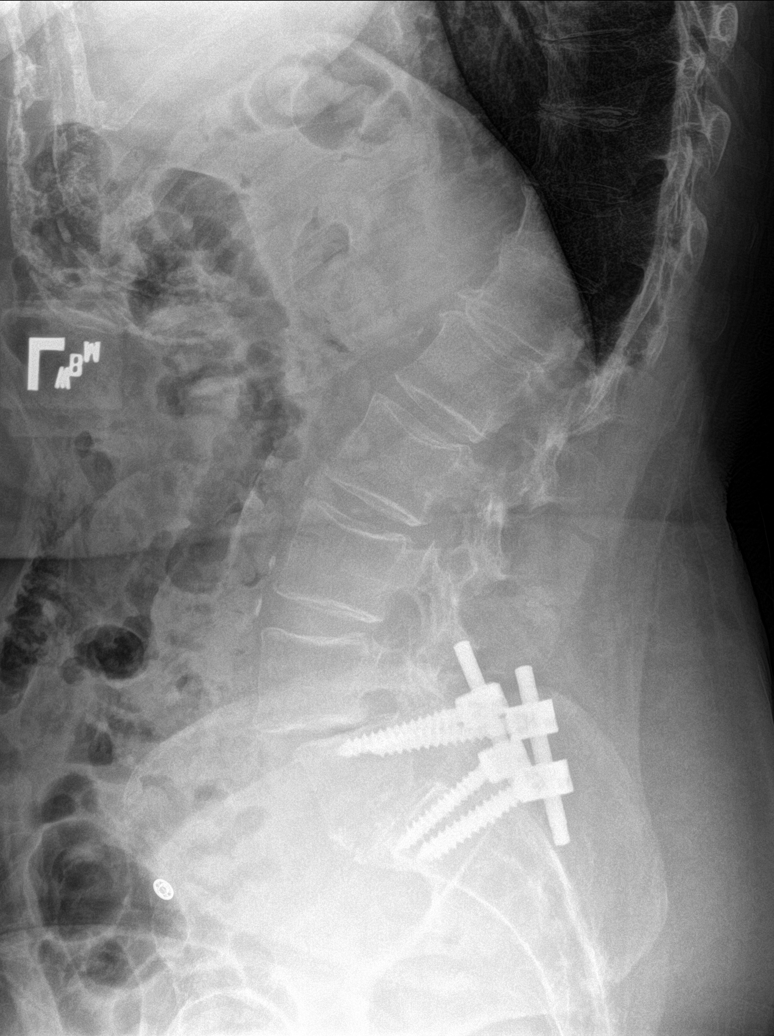

[5 of 5 positions shown; findings below may reference images not displayed]

FINDINGS: Lumbar spine numbered with the lowest segmented to partially
segmented lumbar shaped vertebra on lateral view as L5. Transition
anatomy may be present. L5-S1 posterior fusion. Hardware intact.
Diffuse osteopenia multilevel disc degeneration present. 11 mm
anterolisthesis L4 on L5. No evidence of fracture. Benign appearing
calcification noted over the upper most portion of the left abdomen.
Small calcified right renal stone cannot completely be excluded.
Aortic atherosclerotic vascular calcification.
IMPRESSION: 1. Lumbar spine numbered with the lowest segmented to partially
segmented lumbar shaped vertebra on lateral view as L5. Transitional
anatomy may be present. L5-S1 posterior fusion. Hardware intact.

2. Diffuse osteopenia and multilevel degenerative change. 11 mm
anterolisthesis L4 on L5. No evidence of fracture.

3.  Small calcified right renal stone cannot be completely excluded.

4.  Aortoiliac atherosclerotic vascular disease.

## 2023-09-14 DIAGNOSIS — M25511 Pain in right shoulder: Secondary | ICD-10-CM | POA: Diagnosis not present

## 2023-09-21 ENCOUNTER — Other Ambulatory Visit: Payer: Self-pay | Admitting: Nurse Practitioner

## 2023-09-24 NOTE — Telephone Encounter (Signed)
 Requested Prescriptions  Pending Prescriptions Disp Refills   gabapentin (NEURONTIN) 100 MG capsule [Pharmacy Med Name: GABAPENTIN 100 MG CAP] 180 capsule 0    Sig: TAKE 2 CAPSULES BY MOUTH AT BEDTIME     Neurology: Anticonvulsants - gabapentin Passed - 09/24/2023 10:55 AM      Passed - Cr in normal range and within 360 days    Creatinine, Ser  Date Value Ref Range Status  05/11/2023 0.70 0.57 - 1.00 mg/dL Final         Passed - Completed PHQ-2 or PHQ-9 in the last 360 days      Passed - Valid encounter within last 12 months    Recent Outpatient Visits           2 months ago Chest pain, unspecified type   [facility] [place], Corrie Dandy T, NP   4 months ago Medicare annual wellness visit, subsequent   [facility] [place], [place] T, NP   9 months ago Acute cystitis with hematuria   [facility] Larae Grooms, NP   10 months ago Aortic atherosclerosis (HCC)   [facility] [place], Corrie Dandy T, NP   1 year ago Thrombocytopenia [facility])   [facility] [place], Dorie Rank, NP       Future Appointments             In 2 months [place], Dorie Rank, NP [place] [facility]

## 2023-10-19 ENCOUNTER — Ambulatory Visit: Admitting: Podiatry

## 2023-10-19 ENCOUNTER — Encounter: Payer: Self-pay | Admitting: Podiatry

## 2023-10-19 DIAGNOSIS — Z0279 Encounter for issue of other medical certificate: Secondary | ICD-10-CM

## 2023-10-19 DIAGNOSIS — M2042 Other hammer toe(s) (acquired), left foot: Secondary | ICD-10-CM | POA: Diagnosis not present

## 2023-10-19 NOTE — Progress Notes (Signed)
 Chief Complaint  Patient presents with   Hammer Toe    M8: Est Repair of hammertoe on left foot, second third and fourth toe    Subjective:  Patient presents today status post hammertoe repair digits 2, 3, 4 of the right foot.  DOS: 01/18/2023.  Patient is doing very well to the right foot.  She is back to full activity and good supportive tennis shoes and sneakers.  Overall she is very satisfied  Patient continues to have recurrence of pain and tenderness associated to the hammertoes to the left foot.  She received injections in the past which helped for several months.  She says that slowly over time the pain has returned to the left foot hammertoes.  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 SCREWS IN BACK   bone spur     BREAST BIOPSY Right 1980's   BREAST SURGERY     CERVICAL CONE BIOPSY     COLONOSCOPY WITH PROPOFOL N/A 06/02/2022   Procedure: COLONOSCOPY WITH PROPOFOL;  Surgeon: Wyline Mood, MD;  Location: [facility] ENDOSCOPY;  Service: Gastroenterology;  Laterality: N/A;   HAMMER TOE SURGERY Right 02/01/2023   lumbar fission     MASS EXCISION Left 01/07/2020   Procedure: EXCISION MASS (RESECTION OF ANTECUBITAL VENOUS ANEURYSM);  Surgeon: Renford Dills, MD;  Location: [facility] ORS;  Service: Vascular;  Laterality: Left;   SPINE SURGERY  2003    Allergies  Allergen Reactions   Meloxicam Other (See Comments)    Face got flushed and red.   Adhesive [Tape] Rash    PAPER TAPE OKAY   Lipitor [Atorvastatin] Other (See Comments)    Muscle aches   Oxycodone  Rash   Percodan [Oxycodone-Aspirin] Rash   Pravachol [Pravastatin Sodium] Other (See Comments)    Muscle aches    Propoxyphene Other (See Comments)    depression    Objective/Physical Exam Neurovascular status intact.  Incisions nicely coapted and healed.  Toes are in good rectus alignment to the right foot.  Hammertoe deformity noted to the digits 2-4 of the left foot with associated tenderness with palpation specifically to the second digit  Radiographic Exam RT foot 06/05/2023:  Arthroplasties noted with good alignment of the lesser digits to the right foot.  Radiographic exam LT foot 06/05/2023: Hammertoe contracture noted to the lesser digits 2, 3, 4 of the left foot.  Assessment: 1. s/p hammertoe repair digits 2, 3, 4 right foot.  DOS: 01/18/2023 2.  Symptomatic hammertoes 2, 3, 4 LT foot   Plan of Care:  -Patient was evaluated.  X-rays reviewed again today - Today again we discussed surgical correction of the hammertoe deformities to the feet.  Risk benefits advantages and disadvantages of the procedure were explained in detail to the patient.  No guarantees were expressed or implied.  Patient had very good outcomes with her right foot and she would like to have her left foot corrected for.  She understands the postoperative recovery course. -Authorization for surgery was initiated today.  Surgery will consist of hammertoe arthroplasty with MTP capsulotomy digits 2, 3, 4 of the left foot. -Return to clinic 1 week postop   Felecia Shelling, DPM [facility]  Dr. Felecia Shelling, DPM    [address] [place], [place] [address]                Office [phone]  Fax [phone]

## 2023-10-22 ENCOUNTER — Telehealth: Payer: Self-pay | Admitting: Podiatry

## 2023-10-22 ENCOUNTER — Telehealth: Payer: Self-pay | Admitting: Urology

## 2023-10-22 NOTE — Telephone Encounter (Signed)
 Recd STD forms from [place] office via email. Will complete forms once Surgery date has been scheduled. See notes.

## 2023-10-22 NOTE — Telephone Encounter (Signed)
 Received sx papers from [place] office, called and LM for pt to call back to schedule sx with Dr. Logan Bores.

## 2023-10-26 ENCOUNTER — Telehealth: Payer: Self-pay | Admitting: Podiatry

## 2023-10-26 NOTE — Telephone Encounter (Signed)
 S/w pt and she said she thinks her surger is 02/07/24. She said she is working and doesn't want intermittent leave right now. She will call back closer to DOS for me to complete FMLA for post surgery. I sent mess to [employer] to confirm surgery date  Forms will be held until then per pt

## 2023-11-23 ENCOUNTER — Telehealth (INDEPENDENT_AMBULATORY_CARE_PROVIDER_SITE_OTHER): Payer: Self-pay | Admitting: Nurse Practitioner

## 2023-11-23 NOTE — Telephone Encounter (Signed)
 LVM for pt TCB and schedule sclerotherapy appts.  left leg SALINE sclero. see fb. [employer] #[id number] exp: 4.1.25 - 6.30.25 - 3 units total

## 2023-11-27 ENCOUNTER — Encounter: Payer: Self-pay | Admitting: Nurse Practitioner

## 2023-11-28 ENCOUNTER — Ambulatory Visit: Admitting: Nurse Practitioner

## 2023-11-28 VITALS — BP 115/71 | HR 87 | Temp 97.9°F | Ht 64.0 in | Wt 205.8 lb

## 2023-11-28 DIAGNOSIS — K921 Melena: Secondary | ICD-10-CM | POA: Insufficient documentation

## 2023-11-28 NOTE — Assessment & Plan Note (Signed)
 Ongoing for 3 days, mild tenderness on exam to LLQ.  No other red flags symptoms at this time and BP + pulse in stable ranges.  Concern for GI bleed due to recent steroid and Ibuprofen use + new onset melena.  Will check CBC, CMP, iron, ferritin, TSH, and stool for blood.  Obtain STAT CT abdomen to further assess.  Determine next step after all labs and imaging returns.  She is aware if any worsening symptoms such as severe abdominal pain, loss of appetite, SOB, dizziness, or extreme fatigue to immediately go to ER.

## 2023-11-28 NOTE — Addendum Note (Signed)
 Addended by: Jordy Verba T on: 11/28/2023 06:53 PM   Modules accepted: Orders

## 2023-11-28 NOTE — Progress Notes (Signed)
 BP 115/71   Pulse 87   Temp 97.9 F (36.6 C) (Oral)   Ht 5\' 4"  (1.626 m)   Wt 205 lb 12.8 oz (93.4 kg)   LMP  (LMP Unknown)   SpO2 97%   BMI 35.33 kg/m    Subjective:    Patient ID: Patricia Ferguson, female    DOB: 1951/04/14, 73 y.o.   MRN: [id number]  HPI: Patricia Ferguson is a 73 y.o. female  Chief Complaint  Patient presents with   Melena   DARK STOOLS Started to have black stool Monday, very dark in color. Every stool since then has been like this.  This morning she had a little constipation and felt she had to strain, at baseline rarely has this issue. Has a BM a few days a week.  Has been trying to eat more fiber -- wheat chex.  Denies pain in abdomen.  Has had a lot of gas.  No Pepto Bismol at home and no iron tablets.  Has taken Prednisone  recently for her arm, she has taken in past without issue.  Has also been taking Ibuprofen. Last colonoscopy was 06/07/22 with one polyp removed, next due in 2028.  No history of GI bleeds.  Does not smoke or drink alcohol.  No ASA use at home. Duration:days Alleviating factors: nothing  Aggravating factors: nothing Status: stable Treatments attempted: none Fever: no Nausea: no Vomiting: no Weight loss: no Decreased appetite: no Diarrhea: no Constipation:  just this morning Blood in stool: yes -- dark stool Heartburn: no Jaundice: no Rash: no  Relevant past medical, surgical, family and social history reviewed and updated as indicated. Interim medical history since our last visit reviewed. Allergies and medications reviewed and updated.  Review of Systems  Constitutional:  Negative for activity change, appetite change, diaphoresis, fatigue and fever.  Respiratory:  Negative for cough, chest tightness, shortness of breath and wheezing.   Cardiovascular:  Negative for chest pain, palpitations and leg swelling.  Gastrointestinal:  Positive for blood in stool (melena present, no bright red) and constipation (one episode this  morning). Negative for abdominal distention, abdominal pain, diarrhea, nausea and vomiting.  Neurological: Negative.   Psychiatric/Behavioral: Negative.     Per HPI unless specifically indicated above     Objective:    BP 115/71   Pulse 87   Temp 97.9 F (36.6 C) (Oral)   Ht 5\' 4"  (1.626 m)   Wt 205 lb 12.8 oz (93.4 kg)   LMP  (LMP Unknown)   SpO2 97%   BMI 35.33 kg/m   Wt Readings from Last 3 Encounters:  11/28/23 205 lb 12.8 oz (93.4 kg)  08/28/23 203 lb (92.1 kg)  07/24/23 199 lb (90.3 kg)    Physical Exam Vitals and nursing note reviewed.  Constitutional:      General: She is awake. She is not in acute distress.    Appearance: She is well-developed and well-groomed. She is obese. She is not ill-appearing or toxic-appearing.  HENT:     Head: Normocephalic.     Right Ear: Hearing and external ear normal.     Left Ear: Hearing and external ear normal.  Eyes:     General: Lids are normal.        Right eye: No discharge.        Left eye: No discharge.     Conjunctiva/sclera: Conjunctivae normal.     Pupils: Pupils are equal, round, and reactive to light.  Neck:  Thyroid: No thyromegaly.     Vascular: No carotid bruit.  Cardiovascular:     Rate and Rhythm: Normal rate and regular rhythm.     Heart sounds: Normal heart sounds. No murmur heard.    No gallop.  Pulmonary:     Effort: Pulmonary effort is normal. No accessory muscle usage or respiratory distress.     Breath sounds: Normal breath sounds.  Abdominal:     General: Bowel sounds are normal. There is no distension.     Palpations: Abdomen is soft. There is no mass.     Tenderness: There is abdominal tenderness in the left lower quadrant. There is no right CVA tenderness, left CVA tenderness, guarding or rebound.  Musculoskeletal:     Cervical back: Normal range of motion and neck supple.     Right lower leg: No edema.     Left lower leg: No edema.  Lymphadenopathy:     Cervical: No cervical adenopathy.   Skin:    General: Skin is warm and dry.  Neurological:     Mental Status: She is alert and oriented to person, place, and time.     Deep Tendon Reflexes: Reflexes are normal and symmetric.     Reflex Scores:      Brachioradialis reflexes are 2+ on the right side and 2+ on the left side.      Patellar reflexes are 2+ on the right side and 2+ on the left side. Psychiatric:        Attention and Perception: Attention normal.        Mood and Affect: Mood normal.        Speech: Speech normal.        Behavior: Behavior normal. Behavior is cooperative.        Thought Content: Thought content normal.    Results for orders placed or performed during the hospital encounter of 08/10/23  ECHOCARDIOGRAM COMPLETE   Collection Time: 08/10/23 10:42 AM  Result Value Ref Range   Ao pk vel 1.05 m/s   AV Area VTI 3.29 cm2   AR max vel 2.82 cm2   AV Mean grad 2.0 mmHg   AV Peak grad 4.4 mmHg   S' Lateral 2.70 cm   AV Area mean vel 2.87 cm2   Area-P 1/2 3.76 cm2   MV VTI 2.86 cm2   Est EF 55 - 60%       Assessment & Plan:   Problem List Items Addressed This Visit       Other   Black stools - Primary   Ongoing for 3 days, mild tenderness on exam to LLQ.  No other red flags symptoms at this time and BP + pulse in stable ranges.  Concern for GI bleed due to recent steroid and Ibuprofen use + new onset melena.  Will check CBC, CMP, iron, ferritin, TSH, and stool for blood.  Obtain STAT CT abdomen to further assess.  Determine next step after all labs and imaging returns.  She is aware if any worsening symptoms such as severe abdominal pain, loss of appetite, SOB, dizziness, or extreme fatigue to immediately go to ER.      Relevant Orders   CBC with Differential/Platelet   Ferritin   Iron   Comprehensive metabolic panel with GFR   Fecal occult blood, imunochemical   TSH   CT ABDOMEN PELVIS W WO CONTRAST     Follow up plan: Return in about 5 days (around 12/03/2023) for Melena.

## 2023-11-28 NOTE — Patient Instructions (Signed)
Rectal Bleeding    Rectal bleeding is when blood comes out of the opening of the butt (anus). You may see bright red blood in your underwear or in the toilet after you poop (have a bowel movement). You may also have blood mixed with your poop (stool), or dark red or black poop.  Rectal bleeding is often a sign that something is wrong. It can be caused by many things. It needs to be checked by a doctor.  Follow these instructions at home:  Medicines  Take over-the-counter and prescription medicines only as told by your doctor.  Ask your doctor about changing or stopping your normal medicines. These include blood thinners.  Managing constipation    Your condition may cause trouble pooping (constipation). To prevent or treat this, or to help make your poop soft, you may need to:  Drink enough fluid to keep your pee (urine) pale yellow.  Take over-the-counter or prescription medicines.  Eat foods that are high in fiber. These include beans, whole grains, and fresh fruits and vegetables.  Limit foods that are high in fat and sugar. These include fried or sweet foods.     General instructions  Try not to strain when you poop.  Take a warm bath. This may help with pain.  Watch for changes in your symptoms.  Contact a doctor if:  You have pain or swelling in your belly (abdomen).  You have a fever.  You feel weak or like you may vomit.  You cannot poop.  You have new or more bleeding.  You have black or dark red poop.  You vomit blood or something that looks like coffee grounds.  Get help right away if:  You faint.  You have very bad pain in your butt.  These symptoms may be an emergency. Get help right away. Call 911.  Do not wait to see if the symptoms will go away.  Do not drive yourself to the hospital.  This information is not intended to replace advice given to you by your health care provider. Make sure you discuss any questions you have with your health care provider.  Document Revised: 02/28/2022 Document Reviewed:  02/28/2022  Elsevier Patient Education  2024 [employer].

## 2023-11-29 ENCOUNTER — Encounter: Payer: Self-pay | Admitting: Nurse Practitioner

## 2023-11-29 ENCOUNTER — Other Ambulatory Visit: Payer: Self-pay | Admitting: Nurse Practitioner

## 2023-11-29 ENCOUNTER — Ambulatory Visit
Admission: RE | Admit: 2023-11-29 | Discharge: 2023-11-29 | Disposition: A | Discharge status: Home / Self Care | Source: Ambulatory Visit | Attending: Nurse Practitioner | Admitting: Nurse Practitioner

## 2023-11-29 ENCOUNTER — Telehealth: Payer: Self-pay | Admitting: Nurse Practitioner

## 2023-11-29 DIAGNOSIS — R911 Solitary pulmonary nodule: Secondary | ICD-10-CM | POA: Insufficient documentation

## 2023-11-29 DIAGNOSIS — K921 Melena: Secondary | ICD-10-CM

## 2023-11-29 DIAGNOSIS — K449 Diaphragmatic hernia without obstruction or gangrene: Secondary | ICD-10-CM | POA: Insufficient documentation

## 2023-11-29 LAB — CBC WITH DIFFERENTIAL/PLATELET
Basophils Absolute: 0.1 10*3/uL (ref 0.0–0.2)
Basos: 1 %
EOS (ABSOLUTE): 0.2 10*3/uL (ref 0.0–0.4)
Eos: 3 %
Hematocrit: 32.3 % — ABNORMAL LOW (ref 34.0–46.6)
Hemoglobin: 10.9 g/dL — ABNORMAL LOW (ref 11.1–15.9)
Immature Grans (Abs): 0 10*3/uL (ref 0.0–0.1)
Immature Granulocytes: 0 %
Lymphocytes Absolute: 1.8 10*3/uL (ref 0.7–3.1)
Lymphs: 29 %
MCH: 30.3 pg (ref 26.6–33.0)
MCHC: 33.7 g/dL (ref 31.5–35.7)
MCV: 90 fL (ref 79–97)
Monocytes Absolute: 0.5 10*3/uL (ref 0.1–0.9)
Monocytes: 8 %
Neutrophils Absolute: 3.6 10*3/uL (ref 1.4–7.0)
Neutrophils: 59 %
Platelets: 138 10*3/uL — ABNORMAL LOW (ref 150–450)
RBC: 3.6 x10E6/uL — ABNORMAL LOW (ref 3.77–5.28)
RDW: 12.6 % (ref 11.7–15.4)
WBC: 6.1 10*3/uL (ref 3.4–10.8)

## 2023-11-29 LAB — FERRITIN: Ferritin: 46 ng/mL (ref 15–150)

## 2023-11-29 LAB — COMPREHENSIVE METABOLIC PANEL WITH GFR
ALT: 32 IU/L (ref 0–32)
AST: 29 IU/L (ref 0–40)
Albumin: 3.8 g/dL (ref 3.8–4.8)
Alkaline Phosphatase: 60 IU/L (ref 44–121)
BUN/Creatinine Ratio: 27 (ref 12–28)
BUN: 18 mg/dL (ref 8–27)
Bilirubin Total: 0.8 mg/dL (ref 0.0–1.2)
CO2: 27 mmol/L (ref 20–29)
Calcium: 8.8 mg/dL (ref 8.7–10.3)
Chloride: 105 mmol/L (ref 96–106)
Creatinine, Ser: 0.67 mg/dL (ref 0.57–1.00)
Globulin, Total: 1.8 g/dL (ref 1.5–4.5)
Glucose: 131 mg/dL — ABNORMAL HIGH (ref 70–99)
Potassium: 3.6 mmol/L (ref 3.5–5.2)
Sodium: 141 mmol/L (ref 134–144)
Total Protein: 5.6 g/dL — ABNORMAL LOW (ref 6.0–8.5)
eGFR: 93 mL/min/{1.73_m2} (ref >59)

## 2023-11-29 LAB — IRON: Iron: 40 ug/dL (ref 27–139)

## 2023-11-29 LAB — TSH: TSH: 2.62 u[IU]/mL (ref 0.450–4.500)

## 2023-11-29 MED ORDER — SUCRALFATE 1 G PO TABS: 1.0000 g | ORAL_TABLET | Freq: Three times a day (TID) | ORAL | 0 refills | Status: DC

## 2023-11-29 MED ORDER — IOHEXOL 350 MG/ML SOLN
100.0000 mL | Freq: Once | INTRAVENOUS | Status: AC | PRN
  Administered 2023-11-29: 100 mL via INTRAVENOUS

## 2023-11-29 NOTE — Telephone Encounter (Signed)
 Spoke to [facility] on telephone and reviewed Ct imagining with her.  There was no active bleeding noted, but there is a new large hiatal hernia present + has a 7 mm right side lower lobe pulmonary nodule which is recommended to be recheck via CT in 6 to 12 months.  Educated her on findings + went over labs with her - H/H mildly decreased from her baseline, as is iron (however iron is in normal range).  Remainder of labs reassuring.  She reports feeling like the stool is not as black today. Will hold off on ER visit at this time and and have her take the Carafate as ordered since stool less black today.  She is scheduled to follow-up with PCP Monday.  Discussed with her if any return of dark stool, SOB, abdominal pain, fever, or chest pain to immediately go to ER over weekend. Will place urgent referral to general surgery to further assess large hiatal hernia which is new.  ?if this is causing some bleeding.

## 2023-11-29 NOTE — Progress Notes (Signed)
 Contacted via MyChart   Good morning Patricia Ferguson, your CBC has returned so far and it is showing a drop in your hemoglobin and hematocrit, this is new for you.  Platelets also dropped a little bit. We are waiting on imaging.  I am going to send in some Carafate to start taking today to help protect belly.  If anything abnormal on imaging or you continue black stool today I may instruct your to head to ER.  Any questions?

## 2023-11-29 NOTE — Addendum Note (Signed)
 Addended by: Henley Boettner T on: 11/29/2023 08:28 AM   Modules accepted: Orders

## 2023-11-30 LAB — FECAL OCCULT BLOOD, IMMUNOCHEMICAL: Fecal Occult Bld: NEGATIVE

## 2023-12-01 NOTE — Progress Notes (Signed)
 Contacted via MyChart   Good news, the stool sample is negative for blood.  Hopefully, you are on the mend.  Let me know how you do over the weekend.:)

## 2023-12-01 NOTE — Patient Instructions (Signed)
Wegovy or Zepbound  Focus on DASH diet for high blood pressure or Mediterranean diet  Be Involved in Caring For Your Health:  Taking Medications When medications are taken as directed, they can greatly improve your health. But if they are not taken as prescribed, they may not work. In some cases, not taking them correctly can be harmful. To help ensure your treatment remains effective and safe, understand your medications and how to take them. Bring your medications to each visit for review by your provider.  Your lab results, notes, and after visit summary will be available on My Chart. We strongly encourage you to use this feature. If lab results are abnormal the clinic will contact you with the appropriate steps. If the clinic does not contact you assume the results are satisfactory. You can always view your results on My Chart. If you have questions regarding your health or results, please contact the clinic during office hours. You can also ask questions on My Chart.  We at [facility] are grateful that you chose us to provide your care. We strive to provide evidence-based and compassionate care and are always looking for feedback. If you get a survey from the clinic please complete this so we can hear your opinions.  Preventing High Cholesterol Cholesterol is a white, waxy substance similar to fat that the human body needs to help build cells. The liver makes all the cholesterol that a person's body needs. Having high cholesterol (hypercholesterolemia) increases your risk for heart disease and stroke. Extra or excess cholesterol comes from the food that you eat. High cholesterol can often be prevented with diet and lifestyle changes. If you already have high cholesterol, you can control it with diet, lifestyle changes, and medicines. How can high cholesterol affect me? If you have high cholesterol, fatty deposits (plaques) may build up on the walls of your blood vessels. The blood  vessels that carry blood away from your heart are called arteries. Plaques make the arteries narrower and stiffer. This in turn can: Restrict or block blood flow and cause blood clots to form. Increase your risk for heart attack and stroke. What can increase my risk for high cholesterol? This condition is more likely to develop in people who: Eat foods that are high in saturated fat or cholesterol. Saturated fat is mostly found in foods that come from animal sources. Are overweight. Are not getting enough exercise. Use products that contain nicotine or tobacco, such as cigarettes, e-cigarettes, and chewing tobacco. Have a family history of high cholesterol (familial hypercholesterolemia). What actions can I take to prevent this? Nutrition  Eat less saturated fat. Avoid trans fats (partially hydrogenated oils). These are often found in margarine and in some baked goods, fried foods, and snacks bought in packages. Avoid precooked or cured meat, such as bacon, sausages, or meat loaves. Avoid foods and drinks that have added sugars. Eat more fruits, vegetables, and whole grains. Choose healthy sources of protein, such as fish, poultry, lean cuts of red meat, beans, peas, lentils, and nuts. Choose healthy sources of fat, such as: Nuts. Vegetable oils, especially olive oil. Fish that have healthy fats, such as omega-3 fatty acids. These fish include mackerel or salmon. Lifestyle Lose weight if you are overweight. Maintaining a healthy body mass index (BMI) can help prevent or control high cholesterol. It can also lower your risk for diabetes and high blood pressure. Ask your health care provider to help you with a diet and exercise plan to lose   weight safely. Do not use any products that contain nicotine or tobacco. These products include cigarettes, chewing tobacco, and vaping devices, such as e-cigarettes. If you need help quitting, ask your health care provider. Alcohol use Do not drink  alcohol if: Your health care provider tells you not to drink. You are pregnant, may be pregnant, or are planning to become pregnant. If you drink alcohol: Limit how much you have to: 0-1 drink a day for women. 0-2 drinks a day for men. Know how much alcohol is in your drink. In the [place] one drink equals one 12 oz bottle of beer (355 mL), one 5 oz glass of wine (148 mL), or one 1 oz glass of hard liquor (44 mL). Activity  Get enough exercise. Do exercises as told by your health care provider. Each week, do at least 150 minutes of exercise that takes a medium level of effort (moderate-intensity exercise). This kind of exercise: Makes your heart beat faster while allowing you to still be able to talk. Can be done in short sessions several times a day or longer sessions a few times a week. For example, on 5 days each week, you could walk fast or ride your bike 3 times a day for 10 minutes each time. Medicines Your health care provider may recommend medicines to help lower cholesterol. This may be a medicine to lower the amount of cholesterol that your liver makes. You may need medicine if: Diet and lifestyle changes have not lowered your cholesterol enough. You have high cholesterol and other risk factors for heart disease or stroke. Take over-the-counter and prescription medicines only as told by your health care provider. General information Manage your risk factors for high cholesterol. Talk with your health care provider about all your risk factors and how to lower your risk. Manage other conditions that you have, such as diabetes or high blood pressure (hypertension). Have blood tests to check your cholesterol levels at regular points in time as told by your health care provider. Keep all follow-up visits. This is important. Where to find more information American Heart Association: www.heart.org National Heart, Lung, and Blood Institute: www.nhlbi.nih.gov Summary High cholesterol  increases your risk for heart disease and stroke. By keeping your cholesterol level low, you can reduce your risk for these conditions. High cholesterol can often be prevented with diet and lifestyle changes. Work with your health care provider to manage your risk factors, and have your blood tested regularly. This information is not intended to replace advice given to you by your health care provider. Make sure you discuss any questions you have with your health care provider. Document Revised: 02/10/2022 Document Reviewed: 09/13/2020 Elsevier Patient Education  2024 Elsevier Inc.  

## 2023-12-03 ENCOUNTER — Encounter: Payer: Self-pay | Admitting: Nurse Practitioner

## 2023-12-03 ENCOUNTER — Ambulatory Visit: Payer: Self-pay | Admitting: Nurse Practitioner

## 2023-12-03 VITALS — BP 114/73 | HR 73 | Temp 98.0°F | Ht 64.0 in | Wt 204.0 lb

## 2023-12-03 DIAGNOSIS — K921 Melena: Secondary | ICD-10-CM

## 2023-12-03 DIAGNOSIS — E559 Vitamin D deficiency, unspecified: Secondary | ICD-10-CM

## 2023-12-03 DIAGNOSIS — E782 Mixed hyperlipidemia: Secondary | ICD-10-CM

## 2023-12-03 DIAGNOSIS — I700 Atherosclerosis of aorta: Secondary | ICD-10-CM

## 2023-12-03 DIAGNOSIS — E66811 Obesity, class 1: Secondary | ICD-10-CM

## 2023-12-03 DIAGNOSIS — M85831 Other specified disorders of bone density and structure, right forearm: Secondary | ICD-10-CM

## 2023-12-03 DIAGNOSIS — E6609 Other obesity due to excess calories: Secondary | ICD-10-CM

## 2023-12-03 DIAGNOSIS — R7301 Impaired fasting glucose: Secondary | ICD-10-CM

## 2023-12-03 DIAGNOSIS — E538 Deficiency of other specified B group vitamins: Secondary | ICD-10-CM

## 2023-12-03 DIAGNOSIS — M791 Myalgia, unspecified site: Secondary | ICD-10-CM

## 2023-12-03 DIAGNOSIS — G2581 Restless legs syndrome: Secondary | ICD-10-CM

## 2023-12-03 DIAGNOSIS — K449 Diaphragmatic hernia without obstruction or gangrene: Secondary | ICD-10-CM

## 2023-12-03 NOTE — Assessment & Plan Note (Signed)
 Noted on imaging 11/29/23.  ?cause of recent black stools and anemia, now improving.  Scheduled to see general surgery next week, appreciate their recommendations.

## 2023-12-03 NOTE — Assessment & Plan Note (Signed)
 Chronic, ongoing, recheck level at physical and continue current supplement. May need to increase supplement dosing if remains <30.

## 2023-12-03 NOTE — Assessment & Plan Note (Signed)
 Chronic.  On holiday from [place] , started January 2022.  DEXA on 05/23/22 noted ongoing osteopenia with some improvement in levels.  Vitamin D  level today. Repeat DEXA scan this year or next to recheck.

## 2023-12-03 NOTE — Progress Notes (Signed)
 BP 114/73   Pulse 73   Temp 98 F (36.7 C) (Oral)   Ht 5\' 4"  (1.626 m)   Wt 204 lb (92.5 kg)   LMP  (LMP Unknown)   SpO2 94%   BMI 35.02 kg/m    Subjective:    Patient ID: Patricia Ferguson, female    DOB: 1950/10/06, 73 y.o.   MRN: [id number]  HPI: Patricia Ferguson is a 73 y.o. female  Chief Complaint  Patient presents with   Hyperlipidemia   Hypertension   BLACK STOOLS Was seen on 11/28/23 for concern about black stools, started on Carafate .  Labs did note mildly low H/H and on lower end of normal.  CT Angio noted a large hiatal hernia which was a new finding, but no other acute bleeds.  She reports today that stools are now back to normal color.  No further dark stools.  Prior to black stools she has take steroid taper and Ibuprofen for acute joint pain. Does endorse occasional heartburn and gas. Attends general surgery next week.  HYPERLIPIDEMIA Tolerating Zetia  and Crestor  3 days a week.  History of intolerance to statins with muscle pain. Hyperlipidemia status: good compliance Satisfied with current treatment?  yes Side effects:  no Medication compliance: good compliance Supplements: none Aspirin:  no The 10-year ASCVD risk score (Arnett DK, et al., 2019) is: 7.9%   Values used to calculate the score:     Age: 59 years     Sex: Female     Is Non-Hispanic African American: No     Diabetic: No     Tobacco smoker: No     Systolic Blood Pressure: 114 mmHg     Is BP treated: No     HDL Cholesterol: 57 mg/dL     Total Cholesterol: 156 mg/dL  Chest pain:  no Coronary artery disease:  no Family history CAD:  no Family history early CAD:  no   RLS Takes Gabapentin  100 MG QHS + also uses mustard as needed.  Continues B12 and Vit D for history of low levels.  Saw vascular in past on 12/12/21.  If has been on feet for longer periods the cramps will at times be worse. Duration: chronic Pain: yes Location: lower legs Bilateral:  no Onset: gradual  Frequency: at least once a  week Time of  day:  occurs mostly in the evening and night Sudden unintentional leg jerking:   no Paresthesias:  no Decreased sensation:  no Weakness:   no Insomnia:   no Fatigue:   no Alleviating factors: walking sometimes help Aggravating factors: unknown Status: improvement Treatments attempted: Magnesium + Requip  + iron + B12 + mustard  OSTEOPENIA Takes Vitamin D  daily.  DEXA 05/23/22 noted ongoing osteopenia with improvement from previous. Satisfied with current treatment?: yes Adequate calcium  & vitamin D : yes Weight bearing exercises: yes      05/11/2023    8:26 AM 11/08/2022    8:21 AM 05/09/2022    8:17 AM 03/20/2022   11:11 AM 03/14/2022    8:42 AM  Depression screen PHQ 2/9  Decreased Interest 0 0 0 1 2  Down, Depressed, Hopeless 0 0 1 1 1   PHQ - 2 Score 0 0 1 2 3   Altered sleeping 0 0 1 1 2   Tired, decreased energy 1 0 0 1 0  Change in appetite 0 0 1 0 2  Feeling bad or failure about yourself  0 0 1 0 2  Trouble concentrating 0  0 0 0 0  Moving slowly or fidgety/restless 0 0 0 0 0  Suicidal thoughts 0 0 0 0 0  PHQ-9 Score 1 0 4 4 9   Difficult doing work/chores Not difficult at all Not difficult at all Not difficult at all Somewhat difficult Not difficult at all       05/11/2023    8:31 AM 11/08/2022    8:21 AM 05/09/2022    8:18 AM 03/14/2022    8:42 AM  GAD 7 : Generalized Anxiety Score  Nervous, Anxious, on Edge 0 0 1 2  Control/stop worrying 0 1 1 1   Worry too much - different things 0 1 0 1  Trouble relaxing 0 0 1 2  Restless 0 0 1 1  Easily annoyed or irritable 0 0 0 0  Afraid - awful might happen 0 0 1 0  Total GAD 7 Score 0 2 5 7   Anxiety Difficulty Not difficult at all Not difficult at all Not difficult at all Not difficult at all   Relevant past medical, surgical, family and social history reviewed and updated as indicated. Interim medical history since our last visit reviewed. Allergies and medications reviewed and updated.  Review of  Systems  Constitutional:  Negative for activity change, appetite change, diaphoresis, fatigue and fever.  Respiratory:  Negative for cough, chest tightness and shortness of breath.   Cardiovascular:  Negative for chest pain, palpitations and leg swelling.  Gastrointestinal: Negative.   Neurological: Negative.   Psychiatric/Behavioral: Negative.     Per HPI unless specifically indicated above     Objective:    BP 114/73   Pulse 73   Temp 98 F (36.7 C) (Oral)   Ht 5\' 4"  (1.626 m)   Wt 204 lb (92.5 kg)   LMP  (LMP Unknown)   SpO2 94%   BMI 35.02 kg/m   Wt Readings from Last 3 Encounters:  12/03/23 204 lb (92.5 kg)  11/28/23 205 lb 12.8 oz (93.4 kg)  08/28/23 203 lb (92.1 kg)    Physical Exam Vitals and nursing note reviewed.  Constitutional:      General: She is awake. She is not in acute distress.    Appearance: She is well-developed and well-groomed. She is obese. She is not ill-appearing or toxic-appearing.  HENT:     Head: Normocephalic.     Right Ear: Hearing and external ear normal.     Left Ear: Hearing and external ear normal.  Eyes:     General: Lids are normal.        Right eye: No discharge.        Left eye: No discharge.     Conjunctiva/sclera: Conjunctivae normal.     Pupils: Pupils are equal, round, and reactive to light.  Neck:     Thyroid: No thyromegaly.     Vascular: No carotid bruit.  Cardiovascular:     Rate and Rhythm: Normal rate and regular rhythm.     Heart sounds: Normal heart sounds. No murmur heard.    No gallop.  Pulmonary:     Effort: Pulmonary effort is normal. No accessory muscle usage or respiratory distress.     Breath sounds: Normal breath sounds.  Abdominal:     General: Bowel sounds are normal. There is no distension.     Palpations: Abdomen is soft.     Tenderness: There is no abdominal tenderness.  Musculoskeletal:     Cervical back: Normal range of motion and neck supple.  Right lower leg: No edema.     Left lower  leg: No edema.  Lymphadenopathy:     Cervical: No cervical adenopathy.  Skin:    General: Skin is warm and dry.  Neurological:     Mental Status: She is alert and oriented to person, place, and time.     Deep Tendon Reflexes: Reflexes are normal and symmetric.     Reflex Scores:      Brachioradialis reflexes are 2+ on the right side and 2+ on the left side.      Patellar reflexes are 2+ on the right side and 2+ on the left side. Psychiatric:        Attention and Perception: Attention normal.        Mood and Affect: Mood normal.        Speech: Speech normal.        Behavior: Behavior normal. Behavior is cooperative.        Thought Content: Thought content normal.    Results for orders placed or performed in visit on 11/28/23  CBC with Differential/Platelet   Collection Time: 11/28/23  4:26 PM  Result Value Ref Range   WBC 6.1 3.4 - 10.8 x10E3/uL   RBC 3.60 (L) 3.77 - 5.28 x10E6/uL   Hemoglobin 10.9 (L) 11.1 - 15.9 g/dL   Hematocrit 28.4 (L) 13.2 - 46.6 %   MCV 90 79 - 97 fL   MCH 30.3 26.6 - 33.0 pg   MCHC 33.7 31.5 - 35.7 g/dL   RDW 44.0 10.2 - 72.5 %   Platelets 138 (L) 150 - 450 x10E3/uL   Neutrophils 59 Not Estab. %   Lymphs 29 Not Estab. %   Monocytes 8 Not Estab. %   Eos 3 Not Estab. %   Basos 1 Not Estab. %   Neutrophils Absolute 3.6 1.4 - 7.0 x10E3/uL   Lymphocytes Absolute 1.8 0.7 - 3.1 x10E3/uL   Monocytes Absolute 0.5 0.1 - 0.9 x10E3/uL   EOS (ABSOLUTE) 0.2 0.0 - 0.4 x10E3/uL   Basophils Absolute 0.1 0.0 - 0.2 x10E3/uL   Immature Granulocytes 0 Not Estab. %   Immature Grans (Abs) 0.0 0.0 - 0.1 x10E3/uL  Ferritin   Collection Time: 11/28/23  4:26 PM  Result Value Ref Range   Ferritin 46 15 - 150 ng/mL  Iron   Collection Time: 11/28/23  4:26 PM  Result Value Ref Range   Iron 40 27 - 139 ug/dL  Comprehensive metabolic panel with GFR   Collection Time: 11/28/23  4:26 PM  Result Value Ref Range   Glucose 131 (H) 70 - 99 mg/dL   BUN 18 8 - 27 mg/dL    Creatinine, Ser 3.66 0.57 - 1.00 mg/dL   eGFR 93 >44 IH/KVQ/2.59   BUN/Creatinine Ratio 27 12 - 28   Sodium 141 134 - 144 mmol/L   Potassium 3.6 3.5 - 5.2 mmol/L   Chloride 105 96 - 106 mmol/L   CO2 27 20 - 29 mmol/L   Calcium  8.8 8.7 - 10.3 mg/dL   Total Protein 5.6 (L) 6.0 - 8.5 g/dL   Albumin 3.8 3.8 - 4.8 g/dL   Globulin, Total 1.8 1.5 - 4.5 g/dL   Bilirubin Total 0.8 0.0 - 1.2 mg/dL   Alkaline Phosphatase 60 44 - 121 IU/L   AST 29 0 - 40 IU/L   ALT 32 0 - 32 IU/L  TSH   Collection Time: 11/28/23  4:26 PM  Result Value Ref Range   TSH 2.620  0.450 - 4.500 uIU/mL  Fecal occult blood, imunochemical   Collection Time: 11/29/23 10:57 AM   Specimen: Stool   ST  Result Value Ref Range   Fecal Occult Bld Negative Negative      Assessment & Plan:   Problem List Items Addressed This Visit       Cardiovascular and Mediastinum   Aortic atherosclerosis (HCC) - Primary   Chronic, stable.  Noted on x-ray imaging 11/15/21.  Recommend she continue statin as ordered and take daily Baby ASA for prevention.        Respiratory   Large hiatal hernia   Noted on imaging 11/29/23.  ?cause of recent black stools and anemia, now improving.  Scheduled to see general surgery next week, appreciate their recommendations.      Relevant Orders   CBC with Differential/Platelet   Ferritin   Iron     Musculoskeletal and Integument   Osteopenia   Chronic.  On holiday from [place] , started January 2022.  DEXA on 05/23/22 noted ongoing osteopenia with some improvement in levels.  Vitamin D  level today. Repeat DEXA scan this year or next to recheck.        Other   Vitamin D  deficiency   Chronic, ongoing, recheck level at physical and continue current supplement. May need to increase supplement dosing if remains <30.      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  Noted on labs initially 09/14/21, level 199.  Continue supplement and recheck at physical.      Restless leg syndrome   Chronic,  ongoing.   - Continue Gabapentin . - Continue Magnesium and B12 supplements. - Takes mustard with benefit, continue this. - Could consider period of physical therapy or massage therapy if worsening.  Recommend she wear compression hose on during day and off at night.      Obesity   BMI 35.02.  Recommended eating smaller high protein, low fat meals more frequently and exercising 30 mins a day 5 times a week with a goal of 10-15lb weight loss in the next 3 months. Patient voiced their understanding and motivation to adhere to these recommendations.       Myalgia due to statin   History of trial on various statins with side effect of myalgia, continue Zetia  and tolerating Crestor  at three days a week at this time.  Continue this and if any adverse effects will consider change to Repatha.      Hyperlipidemia   Chronic, ongoing with poor statin tolerance in past. Continue current medication regimen, Zetia  and Crestor  as is tolerating this on 3 day a week regimen.  Could consider discontinuation of Zetia  in future if improved levels.  Lipid panel outpatient in 6 weeks.      Relevant Orders   Lipid Panel w/o Chol/HDL Ratio   Black stools   Improved at this time with no further dark stools.  Continue Carafate  until completed course.  ?if related to recent Prednisone  and Ibuprofen + large hiatal hernia.  Recent imaging reassuring with exception of new hiatal hernia noted.  Labs noting mild reduction in H/H and iron on lower end of normal.  Plan to recheck these levels in 6 weeks outpatient.      Relevant Orders   CBC with Differential/Platelet   Ferritin   Iron   Other Visit Diagnoses       IFG (impaired fasting glucose)       Check A1c outpatient in 6 weeks.   Relevant Orders  HgB A1c         Follow up plan: Return for Annual Physical after 05/10/24 + needs lab only in 6 weeks.

## 2023-12-03 NOTE — Assessment & Plan Note (Signed)
Chronic, ongoing.  Noted on labs initially 09/14/21, level 199.  Continue supplement and recheck at physical.

## 2023-12-03 NOTE — Assessment & Plan Note (Signed)
 Chronic, ongoing with poor statin tolerance in past. Continue current medication regimen, Zetia  and Crestor  as is tolerating this on 3 day a week regimen.  Could consider discontinuation of Zetia  in future if improved levels.  Lipid panel outpatient in 6 weeks.

## 2023-12-03 NOTE — Assessment & Plan Note (Signed)
Chronic, stable.  Noted on x-ray imaging 11/15/21.  Recommend she continue statin as ordered and take daily Baby ASA for prevention.

## 2023-12-03 NOTE — Assessment & Plan Note (Signed)
History of trial on various statins with side effect of myalgia, continue Zetia and tolerating Crestor at three days a week at this time.  Continue this and if any adverse effects will consider change to Repatha. 

## 2023-12-03 NOTE — Assessment & Plan Note (Signed)
 Improved at this time with no further dark stools.  Continue Carafate  until completed course.  ?if related to recent Prednisone  and Ibuprofen + large hiatal hernia.  Recent imaging reassuring with exception of new hiatal hernia noted.  Labs noting mild reduction in H/H and iron on lower end of normal.  Plan to recheck these levels in 6 weeks outpatient.

## 2023-12-03 NOTE — Assessment & Plan Note (Signed)
 Chronic, ongoing.   - Continue Gabapentin . - Continue Magnesium and B12 supplements. - Takes mustard with benefit, continue this. - Could consider period of physical therapy or massage therapy if worsening.  Recommend she wear compression hose on during day and off at night.

## 2023-12-03 NOTE — Assessment & Plan Note (Signed)
BMI 35.02.  Recommended eating smaller high protein, low fat meals more frequently and exercising 30 mins a day 5 times a week with a goal of 10-15lb weight loss in the next 3 months. Patient voiced their understanding and motivation to adhere to these recommendations.  

## 2023-12-10 ENCOUNTER — Ambulatory Visit (INDEPENDENT_AMBULATORY_CARE_PROVIDER_SITE_OTHER): Admitting: Nurse Practitioner

## 2023-12-10 ENCOUNTER — Ambulatory Visit: Admitting: Surgery

## 2023-12-10 VITALS — BP 112/74 | HR 78 | Ht 63.0 in | Wt 204.0 lb

## 2023-12-10 DIAGNOSIS — K449 Diaphragmatic hernia without obstruction or gangrene: Secondary | ICD-10-CM

## 2023-12-10 NOTE — Patient Instructions (Addendum)
 We will get you scheduled with Dr Cornel Diesel to go over having an Upper Endoscopy done.  We want you to get scheduled for a Barium Swallow to look at the function of your esophagus. You may call to schedule this at [phone].  We do recommend that you try to loose some weight prior to surgery. We also recommend reducing your carbohydrate intake  We will have you follow up here after we get the results from these tests.   Carbohydrate Counting , Adult Carbohydrate counting is a method of keeping track of how many carbohydrates you eat. Eating carbohydrates increases the amount of sugar (glucose) in the blood. Counting how many carbohydrates you eat improves how well you manage your blood glucose. This, in turn, helps you manage your diabetes. Carbohydrates are measured in grams (g) per serving. It is important to know how many carbohydrates (in grams or by serving size) you can have in each meal. This is different for every person. A dietitian can help you make a meal plan and calculate how many carbohydrates you should have at each meal and snack. What foods contain carbohydrates? Carbohydrates are found in the following foods: Grains, such as breads and cereals. Dried beans and soy products. Starchy vegetables, such as potatoes, peas, and corn. Fruit and fruit juices. Milk and yogurt. Sweets and snack foods, such as cake, cookies, candy, chips, and soft drinks. How do I count carbohydrates in foods? There are two ways to count carbohydrates in food. You can read food labels or learn standard serving sizes of foods. You can use either of these methods or a combination of both. Using the Nutrition Facts label The Nutrition Facts list is included on the labels of almost all packaged foods and beverages in the [place] . It includes: The serving size. Information about nutrients in each serving, including the grams of carbohydrate per serving. To use the Nutrition Facts, decide how many  servings you will have. Then, multiply the number of servings by the number of carbohydrates per serving. The resulting number is the total grams of carbohydrates that you will be having. Learning the standard serving sizes of foods When you eat carbohydrate foods that are not packaged or do not include Nutrition Facts on the label, you need to measure the servings in order to count the grams of carbohydrates. Measure the foods that you will eat with a food scale or measuring cup, if needed. Decide how many standard-size servings you will eat. Multiply the number of servings by 15. For foods that contain carbohydrates, one serving equals 15 g of carbohydrates. For example, if you eat 2 cups or 10 oz (300 g) of strawberries, you will have eaten 2 servings and 30 g of carbohydrates (2 servings x 15 g = 30 g). For foods that have more than one food mixed, such as soups and casseroles, you must count the carbohydrates in each food that is included. The following list contains standard serving sizes of common carbohydrate-rich foods. Each of these servings has about 15 g of carbohydrates: 1 slice of bread. 1 six-inch (15 cm) tortilla. ? cup or 2 oz (53 g) cooked rice or pasta.  cup or 3 oz (85 g) cooked or canned, drained and rinsed beans or lentils.  cup or 3 oz (85 g) starchy vegetable, such as peas, corn, or squash.  cup or 4 oz (120 g) hot cereal.  cup or 3 oz (85 g) boiled or mashed potatoes, or  or 3 oz (85  g) of a large baked potato.  cup or 4 fl oz (118 mL) fruit juice. 1 cup or 8 fl oz (237 mL) milk. 1 small or 4 oz (106 g) apple.  or 2 oz (63 g) of a medium banana. 1 cup or 5 oz (150 g) strawberries. 3 cups or 1 oz (28.3 g) popped popcorn. What is an example of carbohydrate counting? To calculate the grams of carbohydrates in this sample meal, follow the steps shown below. Sample meal 3 oz (85 g) chicken breast. ? cup or 4 oz (106 g) brown rice.  cup or 3 oz (85 g) corn. 1  cup or 8 fl oz (237 mL) milk. 1 cup or 5 oz (150 g) strawberries with sugar-free whipped topping. Carbohydrate calculation Identify the foods that contain carbohydrates: Rice. Corn. Milk. Strawberries. Calculate how many servings you have of each food: 2 servings rice. 1 serving corn. 1 serving milk. 1 serving strawberries. Multiply each number of servings by 15 g: 2 servings rice x 15 g = 30 g. 1 serving corn x 15 g = 15 g. 1 serving milk x 15 g = 15 g. 1 serving strawberries x 15 g = 15 g. Add together all of the amounts to find the total grams of carbohydrates eaten: 30 g + 15 g + 15 g + 15 g = 75 g of carbohydrates total. What are tips for following this plan? Shopping Develop a meal plan and then make a shopping list. Buy fresh and frozen vegetables, fresh and frozen fruit, dairy, eggs, beans, lentils, and whole grains. Look at food labels. Choose foods that have more fiber and less sugar. Avoid processed foods and foods with added sugars. Meal planning Aim to have the same number of grams of carbohydrates at each meal and for each snack time. Plan to have regular, balanced meals and snacks. Where to find more information American Diabetes Association: diabetes.org Centers for Disease Control and Prevention: TonerPromos.no Academy of Nutrition and Dietetics: eatright.org Association of Diabetes Care & Education Specialists: diabeteseducator.org Summary Carbohydrate counting is a method of keeping track of how many carbohydrates you eat. Eating carbohydrates increases the amount of sugar (glucose) in your blood. Counting how many carbohydrates you eat improves how well you manage your blood glucose. This helps you manage your diabetes. A dietitian can help you make a meal plan and calculate how many carbohydrates you should have at each meal and snack. This information is not intended to replace advice given to you by your health care provider. Make sure you discuss any questions  you have with your health care provider. Document Revised: 02/10/2020 Document Reviewed: 02/11/2020 Elsevier Patient Education  2024 Elsevier Inc.  Hiatal Hernia  A hiatal hernia occurs when part of the stomach slides above the muscle that separates the abdomen from the chest (diaphragm). A person can be born with a hiatal hernia (congenital), or it may develop over time. In almost all cases of hiatal hernia, only the top part of the stomach pushes through the diaphragm. Many people have a hiatal hernia with no symptoms. The larger the hernia, the more likely it is that you will have symptoms. In some cases, a hiatal hernia allows stomach acid to flow back into the tube that carries food from your mouth to your stomach (esophagus). This may cause heartburn symptoms. The development of heartburn symptoms may mean that you have a condition called gastroesophageal reflux disease (GERD). What are the causes? This condition is caused by a  weakness in the opening (hiatus) where the esophagus passes through the diaphragm to attach to the upper part of the stomach. A person may be born with a weakness in the hiatus, or a weakness can develop over time. What increases the risk? This condition is more likely to develop in: Older people. Age is a major risk factor for a hiatal hernia, especially if you are over the age of 20. Pregnant women. People who are overweight. People who have frequent constipation. What are the signs or symptoms? Symptoms of this condition usually develop in the form of GERD symptoms. Symptoms include: Heartburn. Upset stomach (indigestion). Trouble swallowing. Coughing or wheezing. Wheezing is making high-pitched whistling sounds when you breathe. Sore throat. Chest pain. Nausea and vomiting. How is this diagnosed? This condition may be diagnosed during testing for GERD. Tests that may be done include: X-rays of your stomach or chest. An upper gastrointestinal (GI) series.  This is an X-ray exam of your GI tract that is taken after you swallow a chalky liquid that shows up clearly on the X-ray. Endoscopy. This is a procedure to look into your stomach using a thin, flexible tube that has a tiny camera and light on the end of it. How is this treated? This condition may be treated by: Dietary and lifestyle changes to help reduce GERD symptoms. Medicines. These may include: Over-the-counter antacids. Medicines that make your stomach empty more quickly. Medicines that block the production of stomach acid (H2 blockers). Stronger medicines to reduce stomach acid (proton pump inhibitors). Surgery to repair the hernia, if other treatments are not helping. If you have no symptoms, you may not need treatment. Follow these instructions at home: Lifestyle and activity Do not use any products that contain nicotine or tobacco. These products include cigarettes, chewing tobacco, and vaping devices, such as e-cigarettes. If you need help quitting, ask your health care provider. Try to achieve and maintain a healthy body weight. Avoid putting pressure on your abdomen. Anything that puts pressure on your abdomen increases the amount of acid that may be pushed up into your esophagus. Avoid bending over, especially after eating. Raise the head of your bed by putting blocks under the legs. This keeps your head and esophagus higher than your stomach. Do not wear tight clothing around your chest or stomach. Try not to strain when having a bowel movement, when urinating, or when lifting heavy objects. Eating and drinking Avoid foods that can worsen GERD symptoms. These may include: Fatty foods, like fried foods. Citrus fruits, like oranges or lemon. Other foods and drinks that contain acid, like orange juice or tomatoes. Spicy food. Chocolate. Eat frequent small meals instead of three large meals a day. This helps prevent your stomach from getting too full. Eat slowly. Do not lie  down right after eating. Do not eat 1-2 hours before bed. Do not drink beverages with caffeine. These include cola, coffee, cocoa, and tea. Do not drink alcohol. General instructions Take over-the-counter and prescription medicines only as told by your health care provider. Keep all follow-up visits. Your health care provider will want to check that any new prescribed medicines are helping your symptoms. Contact a health care provider if: Your symptoms are not controlled with medicines or lifestyle changes. You are having trouble swallowing. You have coughing or wheezing that will not go away. Your pain is getting worse. Your pain spreads to your arms, neck, jaw, teeth, or back. You feel nauseous or you vomit. Get help right away if:  You have shortness of breath. You vomit blood. You have bright red blood in your stools. You have black, tarry stools. These symptoms may be an emergency. Get help right away. Call 911. Do not wait to see if the symptoms will go away. Do not drive yourself to the hospital. Summary A hiatal hernia occurs when part of the stomach slides above the muscle that separates the abdomen from the chest. A person may be born with a weakness in the hiatus, or a weakness can develop over time. Symptoms of a hiatal hernia may include heartburn, trouble swallowing, or sore throat. Management of a hiatal hernia includes eating frequent small meals instead of three large meals a day. Get help right away if you vomit blood, have bright red blood in your stools, or have black, tarry stools. This information is not intended to replace advice given to you by your health care provider. Make sure you discuss any questions you have with your health care provider. Document Revised: 09/06/2021 Document Reviewed: 09/06/2021 Elsevier Patient Education  2024 [employer].

## 2023-12-10 NOTE — Progress Notes (Signed)
 Patient ID: Patricia Ferguson, female   DOB: [birth year], 73 y.o.   MRN: [id number]  HPI Patricia Ferguson is a 73 y.o. female seen in consultation at the request of Patricia Ferguson. Some atypical chest pain and discomofrt.She endorses some epigastric pain intermittent, dull and mild.  She endorses Reflux at night and worsening when supine. Some dyspnea on exertion . No dysphagia. Able to perform more than 4 METS w/o chest pain. Works as a Diplomatic Services operational officer at [employer], part time.  Prior abdominal operation include appendectomy and hysterectomy CT pers reviewed showing giant Type III paraesophageal H, worsening as compared to 9 years ago. CMP nml, CBC hb 10.9, Pl 138 Colonoscopy 2023 pers reviewed no significant lesions She endorsed black stools. 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 SCREWS IN BACK   bone spur     BREAST BIOPSY Right 1980's   BREAST SURGERY     CERVICAL CONE BIOPSY     COLONOSCOPY WITH PROPOFOL  N/A 06/02/2022   Procedure: COLONOSCOPY WITH PROPOFOL ;  Surgeon: Luke Salaam, MD;  Location: [facility] ENDOSCOPY;  Service: Gastroenterology;  Laterality: N/A;   HAMMER TOE SURGERY Right 02/01/2023   lumbar fission     MASS EXCISION Left 01/07/2020   Procedure: EXCISION MASS (RESECTION OF ANTECUBITAL VENOUS ANEURYSM);  Surgeon: Jackquelyn Mass, MD;  Location: [facility] ORS;  Service: Vascular;  Laterality: Left;   SPINE SURGERY  2003    Family History  Problem Relation Age of Onset   Diabetes Mother    Hyperlipidemia  Mother    Hypertension Mother    Osteoporosis Mother    Arthritis Mother    Cancer Father        lung   Diabetes Father    Diabetes Sister    Hyperlipidemia Sister    Hypertension Sister    Hypertension Brother    Hyperlipidemia Sister    Breast cancer Neg Hx     Social History Social History   Tobacco Use   Smoking status: Never   Smokeless tobacco: Never  Vaping Use   Vaping status: Never Used  Substance Use Topics   Alcohol use: Not Currently    Comment: on occasion   Drug use: No    Allergies  Allergen Reactions   Meloxicam  Other (See Comments)    Face got flushed and red.   Adhesive [Tape] Rash    PAPER TAPE OKAY   Lipitor [Atorvastatin] Other (See Comments)    Muscle aches   Oxycodone Rash   Percodan [Oxycodone-Aspirin] Rash   Pravachol [Pravastatin Sodium] Other (See Comments)    Muscle aches    Propoxyphene Other (See Comments)    depression    Current Outpatient Medications  Medication Sig Dispense Refill   Cranberry 4200 MG CAPS Take 1 capsule by mouth 2 (two) times daily.     ezetimibe  (ZETIA ) 10 MG tablet TAKE 1 TABLET  BY MOUTH ONCE DAILY 90 tablet 3   famotidine  (PEPCID ) 20 MG tablet TAKE 1 TABLET BY MOUTH ONCE DAILY 90 tablet 3   gabapentin  (NEURONTIN ) 100 MG capsule TAKE 2 CAPSULES BY MOUTH AT BEDTIME 180 capsule 0   hydroxypropyl methylcellulose / hypromellose (ISOPTO TEARS / GONIOVISC) 2.5 % ophthalmic solution Place 1 drop into both eyes daily.     Magnesium 250 MG TABS Take 1 tablet by mouth daily.     rosuvastatin  (CRESTOR ) 20 MG tablet TAKE 1 TABLET BY MOUTH THREE TIMES A WEEK (MON WEDNESDAY AND FRIDAY) 36 tablet 4   sucralfate  (CARAFATE ) 1 g tablet Take 1 tablet (1 g total) by mouth 4 (four) times daily -  with meals and at bedtime. 120 tablet 0   vitamin B-12 (CYANOCOBALAMIN ) 500 MCG tablet Take 500 mcg by mouth. Most days     Vitamin D , Cholecalciferol, 25 MCG (1000 UT) TABS Take 1 tablet by mouth daily.     No current  facility-administered medications for this visit.     Review of Systems Full ROS  was asked and was negative except for the information on the HPI  Physical Exam Height 5\' 3"  (1.6 m), weight 204 lb (92.5 kg). CONSTITUTIONAL: NAD BMI 36. EYES: Pupils are equal, round, and reactive to light, Sclera are non-icteric. EARS, NOSE, MOUTH AND THROAT: The oropharynx is clear. The oral mucosa is pink and moist. Hearing is intact to voice. LYMPH NODES:  Lymph nodes in the neck are normal. RESPIRATORY:  Lungs are clear. There is normal respiratory effort, with equal breath sounds bilaterally, and without pathologic use of accessory muscles. CARDIOVASCULAR: Heart is regular without murmurs, gallops, or rubs. GI: The abdomen is  soft, nontender, and nondistended. There are no palpable masses. There is no hepatosplenomegaly. There are normal bowel sounds in all quadrants. GU: Rectal deferred.   MUSCULOSKELETAL: Normal muscle strength and tone. No cyanosis or edema.   SKIN: Turgor is good and there are no pathologic skin lesions or ulcers. NEUROLOGIC: Motor and sensation is grossly normal. Cranial nerves are grossly intact. PSYCH:  Oriented to person, place and time. Affect is normal.  Data Reviewed I have personally reviewed the patient's imaging, laboratory findings and medical records.    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  Discussed with patient in detail about her disease process.  I do think that we we will require further workup including EGD, colonoscopy  and B swallow as well. D/W her what robotic repair will entail, risks, benefits and possible complications. Also d/w her about diet modifications Discussed with patient in detail about her disease processes and process for recommending repair.  Her BMI is 36 and I encouraged her to optimize her weight. No need for emergent surgical intervention I will see her in a few weeks once she completes  her w/u,we talked about diet and exercise as well.  I spent 60 minutes in this encounter including extensive review of medical records, personally reviewing imaging this studies, coordinating her care, placing orders and performing documentation   Evelia Hipp, MD FACS General Surgeon 12/10/2023, 10:02 AM

## 2023-12-11 ENCOUNTER — Encounter (INDEPENDENT_AMBULATORY_CARE_PROVIDER_SITE_OTHER): Payer: Self-pay

## 2023-12-18 ENCOUNTER — Ambulatory Visit: Admitting: General Surgery

## 2023-12-18 ENCOUNTER — Other Ambulatory Visit: Payer: Self-pay | Admitting: *Deleted

## 2023-12-18 ENCOUNTER — Encounter: Payer: Self-pay | Admitting: General Surgery

## 2023-12-18 ENCOUNTER — Telehealth: Payer: Self-pay | Admitting: *Deleted

## 2023-12-18 VITALS — BP 123/81 | HR 69 | Temp 98.1°F | Ht 63.0 in | Wt 200.6 lb

## 2023-12-18 DIAGNOSIS — K449 Diaphragmatic hernia without obstruction or gangrene: Secondary | ICD-10-CM

## 2023-12-18 DIAGNOSIS — K921 Melena: Secondary | ICD-10-CM

## 2023-12-18 MED ORDER — NA SULFATE-K SULFATE-MG SULF 17.5-3.13-1.6 GM/177ML PO SOLN: 1.0000 | Freq: Once | ORAL | 0 refills | Status: AC

## 2023-12-18 NOTE — Telephone Encounter (Signed)
 Brighten Buzzelli MRN: [id number] EGD/Colonoscopy: K44.9 (hiatal hernia), K92.1 (melena)

## 2023-12-18 NOTE — Telephone Encounter (Signed)
 Gastroenterology Pre-Procedure Review  Request Date: 01/02/2024 Requesting Physician: Dr. Cornel Diesel  PATIENT REVIEW QUESTIONS: The patient responded to the following health history questions as indicated:    1. Are you having any GI issues? yes (dark stool) 2. Do you have a personal history of Polyps? no 3. Do you have a family history of Colon Cancer or Polyps? no 4. Diabetes Mellitus? no 5. Joint replacements in the past 12 months?no 6. Major health problems in the past 3 months?no 7. Any artificial heart valves, MVP, or defibrillator?no    MEDICATIONS & ALLERGIES:    Patient reports the following regarding taking any anticoagulation/antiplatelet therapy:   Plavix, Coumadin, Eliquis, Xarelto, Lovenox, Pradaxa, Brilinta, or Effient? no Aspirin? no  Patient confirms/reports the following medications:  Current Outpatient Medications  Medication Sig Dispense Refill   Cranberry 4200 MG CAPS Take 1 capsule by mouth 2 (two) times daily.     ezetimibe  (ZETIA ) 10 MG tablet TAKE 1 TABLET BY MOUTH ONCE DAILY 90 tablet 3   famotidine  (PEPCID ) 20 MG tablet TAKE 1 TABLET BY MOUTH ONCE DAILY 90 tablet 3   gabapentin  (NEURONTIN ) 100 MG capsule TAKE 2 CAPSULES BY MOUTH AT BEDTIME 180 capsule 0   hydroxypropyl methylcellulose / hypromellose (ISOPTO TEARS / GONIOVISC) 2.5 % ophthalmic solution Place 1 drop into both eyes daily.     Magnesium 250 MG TABS Take 1 tablet by mouth daily.     rosuvastatin  (CRESTOR ) 20 MG tablet TAKE 1 TABLET BY MOUTH THREE TIMES A WEEK (MON WEDNESDAY AND FRIDAY) 36 tablet 4   sucralfate  (CARAFATE ) 1 g tablet Take 1 tablet (1 g total) by mouth 4 (four) times daily -  with meals and at bedtime. 120 tablet 0   vitamin B-12 (CYANOCOBALAMIN ) 500 MCG tablet Take 500 mcg by mouth. Most days     Vitamin D , Cholecalciferol, 25 MCG (1000 UT) TABS Take 1 tablet by mouth daily.     No current facility-administered medications for this visit.    Patient confirms/reports the following  allergies:  Allergies  Allergen Reactions   Meloxicam  Other (See Comments)    Face got flushed and red.   Adhesive [Tape] Rash    PAPER TAPE OKAY   Lipitor [Atorvastatin] Other (See Comments)    Muscle aches   Oxycodone Rash   Percodan [Oxycodone-Aspirin] Rash   Pravachol [Pravastatin Sodium] Other (See Comments)    Muscle aches    Propoxyphene Other (See Comments)    depression    No orders of the defined types were placed in this encounter.   AUTHORIZATION INFORMATION Primary Insurance: 1D#: Group #:  Secondary Insurance: 1D#: Group #:  SCHEDULE INFORMATION: Date: 01/02/2024 Time: Location:  [facility]

## 2023-12-18 NOTE — Patient Instructions (Addendum)
 Please call if you have any questions or concerns  [facility] will be giving you a call to go over the prep information

## 2023-12-19 ENCOUNTER — Ambulatory Visit
Admission: RE | Admit: 2023-12-19 | Discharge: 2023-12-19 | Disposition: A | Discharge status: Home / Self Care | Source: Ambulatory Visit | Attending: Surgery | Admitting: Surgery

## 2023-12-19 DIAGNOSIS — K449 Diaphragmatic hernia without obstruction or gangrene: Secondary | ICD-10-CM | POA: Diagnosis present

## 2023-12-20 ENCOUNTER — Telehealth: Payer: Self-pay | Admitting: Podiatry

## 2023-12-20 NOTE — Telephone Encounter (Signed)
 DOS: 02/07/24  (LT) HAMMER TOE REPAIR 2ND,3RD, 4TH -[id number]     EFFECTIVE DATE :  07/25/2019    OOP:   $4,000.00  REMAINING:   $3,480.00  COINSURANCE:   0%   PER THE [facility] PROVIDER PORTAL CPT CODES 81191  Authorization #[id number]  Tracking [id number]

## 2023-12-28 NOTE — Progress Notes (Signed)
 Patient ID: Patricia Ferguson, female   DOB: [birth year], 73 y.o.   MRN: [id number] CC: Hiatal Hernia, Melena History of Present Illness Patricia Ferguson is a 73 y.o. female with past medical history as below who presents in consultation for need for EGD as well as colonoscopy.  The patient has seen my partner Dr. Dana Duncan and is being worked up for a hiatal hernia.  She also reports reflux that is worse when supine.  She does not report any regurgitation but does report some dyspnea on exertion.  She also says that she has been having black stools.  Her hemoglobin is 10.9 and her last colonoscopy was done in 2023.  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   Adhesive [Tape] Rash    PAPER TAPE OKAY   Lipitor [Atorvastatin] Other (See Comments)    Muscle aches   Oxycodone Rash   Percodan  [Oxycodone-Aspirin] Rash   Pravachol [Pravastatin Sodium] Other (See Comments)    Muscle aches    Propoxyphene Other (See Comments)    depression    Current Outpatient Medications  Medication Sig Dispense Refill   Cranberry 4200 MG CAPS Take 1 capsule by mouth 2 (two) times daily.     ezetimibe  (ZETIA ) 10 MG tablet TAKE 1 TABLET BY MOUTH ONCE DAILY 90 tablet 3   famotidine  (PEPCID ) 20 MG tablet TAKE 1 TABLET BY MOUTH ONCE DAILY 90 tablet 3   gabapentin  (NEURONTIN ) 100 MG capsule TAKE 2 CAPSULES BY MOUTH AT BEDTIME 180 capsule 0   hydroxypropyl methylcellulose / hypromellose (ISOPTO TEARS / GONIOVISC) 2.5 % ophthalmic solution Place 1 drop into both eyes daily.     Magnesium 250 MG TABS Take 1 tablet by mouth daily.     rosuvastatin  (CRESTOR ) 20 MG tablet TAKE 1 TABLET BY MOUTH THREE TIMES A WEEK (MON WEDNESDAY AND FRIDAY) 36 tablet 4   sucralfate  (CARAFATE ) 1 g tablet Take 1 tablet (1 g total) by mouth 4 (four) times daily -  with meals and at bedtime. 120 tablet 0   vitamin B-12 (CYANOCOBALAMIN ) 500 MCG tablet Take 500 mcg by mouth. Most days     Vitamin  D, Cholecalciferol, 25 MCG (1000 UT) TABS Take 1 tablet by mouth daily.     No current facility-administered medications for this visit.    Family History Family History  Problem Relation Age of Onset   Diabetes Mother    Hyperlipidemia Mother    Hypertension Mother    Osteoporosis Mother    Arthritis Mother    Cancer Father        lung   Diabetes Father    Diabetes Sister    Hyperlipidemia Sister    Hypertension Sister    Hypertension Brother    Hyperlipidemia Sister    Breast cancer Neg Hx        Social History Social History   Tobacco Use   Smoking status: Never   Smokeless tobacco: Never  Vaping Use   Vaping status: Never Used  Substance Use Topics   Alcohol use: Not Currently    Comment: on occasion   Drug use: No        ROS Full ROS of systems performed and is otherwise negative there than what is  stated in the HPI  Physical Exam Blood pressure 123/81, pulse 69, temperature 98.1 F (36.7 C), temperature source Oral, height 5\' 3"  (1.6 m), weight 200 lb 9.6 oz (91 kg), SpO2 95%. Alert and oriented x 3, normal work of breathing room air, regular rate and rhythm, abdomen is soft, nontender nondistended, moving all extremities spontaneously mood and affect appropriate Data Reviewed I reviewed her hemoglobin is 10.9.  Her platelets are 138.  I also reviewed her past colonoscopic images that showed a polyp in the sigmoid colon.  I also reviewed her CT scan that shows a giant type III paraesophageal hernia that is worse than previous imaging that was done several years ago.  I have personally reviewed the patient's imaging and medical records.    Assessment/Plan    Patient with known paraesophageal hernia that needs a repeat EGD to ensure that there is no other stenosis or pathology to prevent repair of this.  She is also having melena so we will plan for colonoscopy.  I discussed the risk and benefits of both of the procedures including risk of esophageal perforation, inability to perform endoscopy, bleeding, perforation.  She understands these risks and wishes to proceed    Patricia Ferguson

## 2024-01-02 ENCOUNTER — Encounter
Admission: RE | Disposition: A | Discharge status: Home / Self Care | Payer: Self-pay | Source: Home / Self Care | Attending: General Surgery

## 2024-01-02 ENCOUNTER — Ambulatory Visit: Admitting: Certified Registered Nurse Anesthetist

## 2024-01-02 ENCOUNTER — Ambulatory Visit
Admission: RE | Admit: 2024-01-02 | Discharge: 2024-01-02 | Disposition: A | Discharge status: Home / Self Care | Attending: General Surgery | Admitting: General Surgery

## 2024-01-02 DIAGNOSIS — E669 Obesity, unspecified: Secondary | ICD-10-CM | POA: Insufficient documentation

## 2024-01-02 DIAGNOSIS — K921 Melena: Secondary | ICD-10-CM | POA: Diagnosis present

## 2024-01-02 DIAGNOSIS — K449 Diaphragmatic hernia without obstruction or gangrene: Secondary | ICD-10-CM

## 2024-01-02 DIAGNOSIS — Q438 Other specified congenital malformations of intestine: Secondary | ICD-10-CM | POA: Insufficient documentation

## 2024-01-02 DIAGNOSIS — K219 Gastro-esophageal reflux disease without esophagitis: Secondary | ICD-10-CM | POA: Insufficient documentation

## 2024-01-02 DIAGNOSIS — Z6834 Body mass index (BMI) 34.0-34.9, adult: Secondary | ICD-10-CM | POA: Insufficient documentation

## 2024-01-02 HISTORY — PX: COLONOSCOPY: SHX5424

## 2024-01-02 HISTORY — PX: ESOPHAGOGASTRODUODENOSCOPY: SHX5428

## 2024-01-02 SURGERY — EGD (ESOPHAGOGASTRODUODENOSCOPY)
Anesthesia: General

## 2024-01-02 MED ORDER — PHENYLEPHRINE 80 MCG/ML (10ML) SYRINGE FOR IV PUSH (FOR BLOOD PRESSURE SUPPORT)
PREFILLED_SYRINGE | INTRAVENOUS | Status: DC | PRN
  Administered 2024-01-02: 80 ug via INTRAVENOUS
  Administered 2024-01-02: 160 ug via INTRAVENOUS
  Administered 2024-01-02: 80 ug via INTRAVENOUS
  Administered 2024-01-02: 160 ug via INTRAVENOUS

## 2024-01-02 MED ORDER — PROPOFOL 10 MG/ML IV BOLUS
INTRAVENOUS | Status: DC | PRN
  Administered 2024-01-02: 80 mg via INTRAVENOUS

## 2024-01-02 MED ORDER — SODIUM CHLORIDE 0.9 % IV SOLN
INTRAVENOUS | Status: DC
  Administered 2024-01-02: 20 mL/h via INTRAVENOUS

## 2024-01-02 MED ORDER — ONDANSETRON HCL 4 MG/2ML IJ SOLN
INTRAMUSCULAR | Status: DC | PRN
Start: 2024-01-02 — End: 2024-01-02
  Administered 2024-01-02: 4 mg via INTRAVENOUS

## 2024-01-02 MED ORDER — GLYCOPYRROLATE 0.2 MG/ML IJ SOLN
INTRAMUSCULAR | Status: DC | PRN
  Administered 2024-01-02: .2 mg via INTRAVENOUS

## 2024-01-02 MED ORDER — LIDOCAINE HCL (CARDIAC) PF 100 MG/5ML IV SOSY
PREFILLED_SYRINGE | INTRAVENOUS | Status: DC | PRN
Start: 2024-01-02 — End: 2024-01-02
  Administered 2024-01-02: 100 mg via INTRAVENOUS

## 2024-01-02 MED ORDER — PROPOFOL 500 MG/50ML IV EMUL
INTRAVENOUS | Status: DC | PRN
  Administered 2024-01-02: 150 ug/kg/min via INTRAVENOUS

## 2024-01-02 MED ORDER — DEXAMETHASONE SODIUM PHOSPHATE 10 MG/ML IJ SOLN
INTRAMUSCULAR | Status: DC | PRN
  Administered 2024-01-02: 10 mg via INTRAVENOUS

## 2024-01-02 NOTE — Discharge Instructions (Signed)
YOU HAD AN ENDOSCOPIC PROCEDURE TODAY: Refer to the procedure report that was given to you for any specific questions about what was found during the examination.  If the procedure report does not answer your questions, please call your gastroenterologist to clarify. ° °YOU SHOULD EXPECT: Some feelings of bloating in the abdomen. Passage of more gas than usual.  Walking can help get rid of the air that was put into your GI tract during the procedure and reduce the bloating. If you had a lower endoscopy (such as a colonoscopy or flexible sigmoidoscopy) you may notice spotting of blood in your stool or on the toilet paper.  ° °DIET: Your first meal following the procedure should be a light meal and then it is ok to progress to your normal diet.  A half-sandwich or bowl of soup is an example of a good first meal.  Heavy or fried foods are harder to digest and may make you feel nasueas or bloated.  Drink plenty of fluids but you should avoid alcoholic beverages for 24 hours. ° °ACTIVITY: Your care partner should take you home directly after the procedure.  You should plan to take it easy, moving slowly for the rest of the day.  You can resume normal activity the day after the procedure however you should NOT DRIVE, make legal decisions or use heavy machinery for 24 hours (because of the sedation medicines used during the test).   ° °SYMPTOMS TO REPORT IMMEDIATELY  °A gastroenterologist can be reached at any hour.  Please call your doctor's office for any of the following symptoms: ° °· Following lower endoscopy (colonoscopy, flexible sigmoidoscopy) ° Excessive amounts of blood in the stool ° Significant tenderness, worsening of abdominal pains ° Swelling of the abdomen that is new, acute ° Fever of 100° or higher °· Following upper endoscopy (EGD, EUS, ERCP) ° Vomiting of blood or coffee ground material ° New, significant abdominal pain ° New, significant chest pain or pain under the shoulder blades ° Painful or  persistently difficult swallowing ° New shortness of breath ° Black, tarry-looking stools ° °FOLLOW UP: °If any biopsies were taken you will be contacted by phone or by letter within the next 1-3 weeks.  Call your gastroenterologist if you have not heard about the biopsies in 3 weeks.  ° °Please also call your gastroenterologist's office with any specific questions about appointments or follow up tests. °

## 2024-01-02 NOTE — Op Note (Addendum)
 [facility] Gastroenterology Patient Name: Patricia Ferguson Procedure Date: 01/02/2024 9:58 AM MRN: [id number] Account #: [id number] Date of Birth: [birth year] Admit Type: Outpatient Age: 73 Room: [facility] ENDO ROOM 1 Gender: Female Note Status: Supervisor Override Instrument Name: Barnie Endoscope [id number] Procedure:             Upper GI endoscopy Indications:           Hiatal hernia Providers:             Jayson KIDD. Marinda, MD Referring MD:          Jolene T. Cannady (Referring MD) Medicines:             See the Anesthesia note for documentation of the                         administered medications Complications:         No immediate complications. Estimated blood loss:                         Minimal. Procedure:             Pre-Anesthesia Assessment:                        - Prior to the procedure, a History and Physical was                         performed, and patient medications and allergies were                         reviewed. The patient's tolerance of previous                         anesthesia was also reviewed. The risks and benefits                         of the procedure and the sedation options and risks                         were discussed with the patient. All questions were                         answered, and informed consent was obtained. Prior                         Anticoagulants: The patient has taken no anticoagulant                         or antiplatelet agents. ASA Grade Assessment: III - A                         patient with severe systemic disease. After reviewing                         the risks and benefits, the patient was deemed in                         satisfactory condition to undergo the procedure.  After obtaining informed consent, the endoscope was                         passed under direct vision. Throughout the procedure,                         the patient's blood pressure, pulse, and oxygen                          saturations were monitored continuously. The Endoscope                         was introduced through the mouth, and advanced to the                         second part of duodenum. The upper GI endoscopy was                         accomplished without difficulty. The patient tolerated                         the procedure well. Findings:      A medium-sized hiatal hernia was present.      A medium-sized hiatal hernia was present. This was biopsied with a cold       forceps for Helicobacter pylori testing.      The in the duodenum was normal. Impression:            - Medium-sized hiatal hernia.                        - Medium-sized hiatal hernia. Biopsied.                        - Normal. Recommendation:        - Return to referring physician. Procedure Code(s):     --- Professional ---                        [phone], Esophagogastroduodenoscopy, flexible,                         transoral; with biopsy, single or multiple CPT copyright 2022 American Medical Association. All rights reserved. The codes documented in this report are preliminary and upon coder review may  be revised to meet current compliance requirements. Jayson MALVA Endow, MD 01/02/2024 11:19:01 AM Number of Addenda: 0 Note Initiated On: 01/02/2024 9:58 AM Total Procedure Duration: 0 hours 19 minutes 57 seconds       [facility]

## 2024-01-02 NOTE — H&P (Signed)
 CC: Hiatal Hernia, Melena History of Present Illness Patricia Ferguson is a 73 y.o. female with past medical history as below who presents in consultation for need for EGD as well as colonoscopy.  The patient has seen my partner Dr. Dana Duncan and is being worked up for a hiatal hernia.  She also reports reflux that is worse when supine.  She does not report any regurgitation but does report some dyspnea on exertion.  She also says that she has been having black stools.  Her hemoglobin is 10.9 and her last colonoscopy was done in 2023.   Past Medical History     Past Medical History:  Diagnosis Date   Allergy     Arthritis      right knee   FH: colonic polyps     GERD (gastroesophageal reflux disease)     Hypercholesterolemia     Internal hemorrhoids     Obesity     Osteopenia     Pneumonia     Venous aneurysm 11/2019    left antecubital   Walking pneumonia                   Past Surgical History:  Procedure Laterality Date   ABDOMINAL HYSTERECTOMY       ANAL FISSURE REPAIR   1980's   APPENDECTOMY       BACK SURGERY   2000    lumbar fusion. 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   Adhesive [Tape] Rash      PAPER TAPE OKAY   Lipitor [Atorvastatin] Other (See Comments)      Muscle aches   Oxycodone Rash   Percodan  [Oxycodone-Aspirin] Rash   Pravachol [Pravastatin Sodium] Other (See Comments)      Muscle aches     Propoxyphene Other (See Comments)      depression              Current Outpatient Medications  Medication Sig Dispense Refill   Cranberry 4200 MG CAPS Take 1 capsule by mouth 2 (two) times daily.       ezetimibe  (ZETIA ) 10 MG tablet TAKE 1 TABLET BY MOUTH ONCE DAILY 90 tablet 3   famotidine  (PEPCID ) 20 MG tablet TAKE 1 TABLET BY MOUTH ONCE DAILY 90 tablet 3   gabapentin  (NEURONTIN ) 100 MG capsule TAKE 2 CAPSULES BY MOUTH AT BEDTIME 180 capsule 0   hydroxypropyl methylcellulose / hypromellose (ISOPTO TEARS / GONIOVISC) 2.5 % ophthalmic solution Place 1 drop into both eyes daily.       Magnesium 250 MG TABS Take 1 tablet by mouth daily.       rosuvastatin  (  CRESTOR ) 20 MG tablet TAKE 1 TABLET BY MOUTH THREE TIMES A WEEK (MON WEDNESDAY AND FRIDAY) 36 tablet 4   sucralfate  (CARAFATE ) 1 g tablet Take 1 tablet (1 g total) by mouth 4 (four) times daily -  with meals and at bedtime. 120 tablet 0   vitamin B-12 (CYANOCOBALAMIN ) 500 MCG tablet Take 500 mcg by mouth. Most days       Vitamin D , Cholecalciferol, 25 MCG (1000 UT) TABS Take 1 tablet by mouth daily.          No current facility-administered medications for this visit.        Family History      Family History  Problem Relation Age of Onset   Diabetes Mother     Hyperlipidemia Mother     Hypertension Mother     Osteoporosis Mother     Arthritis Mother     Cancer Father          lung   Diabetes Father     Diabetes Sister     Hyperlipidemia Sister     Hypertension Sister     Hypertension Brother     Hyperlipidemia Sister     Breast cancer Neg Hx              Social History Social History  Social History         Tobacco Use   Smoking status: Never   Smokeless tobacco: Never  Vaping Use   Vaping status: Never Used  Substance Use Topics   Alcohol use: Not Currently      Comment: on occasion   Drug use: No             ROS Full ROS of systems performed and is otherwise negative there than what is stated in the HPI   Physical Exam Blood pressure 123/81, pulse 69, temperature 98.1 F (36.7 C), temperature source Oral, height 5' 3 (1.6 m), weight 200 lb 9.6 oz (91 kg), SpO2 95%. Alert and oriented x 3, normal work of breathing room air, regular rate and rhythm, abdomen is soft, nontender nondistended, moving all extremities spontaneously mood and affect appropriate Data Reviewed I reviewed her hemoglobin is 10.9.  Her platelets are 138.  I also reviewed her past colonoscopic images that showed a polyp in the sigmoid colon.  I also reviewed her CT scan that shows a giant type III paraesophageal hernia that is worse than previous imaging that was done several years ago.   I have personally reviewed the patient's imaging and medical records.     Assessment/Plan Assessment Patient with known paraesophageal hernia that needs a repeat EGD to ensure that there is no other stenosis or pathology to prevent repair of this.  She is also having melena so we will plan for colonoscopy.  I discussed the risk and benefits of both of the procedures including risk of esophageal perforation, inability to perform endoscopy, bleeding, perforation.  She understands these risks and wishes to proceed       Barrett Lick

## 2024-01-02 NOTE — Transfer of Care (Signed)
 Immediate Anesthesia Transfer of Care Note  Patient: Patricia Ferguson  Procedure(s) Performed: EGD (ESOPHAGOGASTRODUODENOSCOPY) COLONOSCOPY  Patient Location: PACU  Anesthesia Type:General  Level of Consciousness: drowsy  Airway & Oxygen Therapy: Patient Spontanous Breathing  Post-op Assessment: Report given to RN and Post -op Vital signs reviewed and stable  Post vital signs: Reviewed and stable  Last Vitals:  Vitals Value Taken Time  BP 110/74 01/02/24 1116  Temp    Pulse 90 01/02/24 1117  Resp 19 01/02/24 1117  SpO2 99 % 01/02/24 1117    Last Pain:  Vitals:   01/02/24 1116  TempSrc:   PainSc: Asleep         Complications: No notable events documented.

## 2024-01-02 NOTE — Anesthesia Procedure Notes (Signed)
 Procedure Name: MAC Date/Time: 01/02/2024 9:59 AM  Performed by: Delice Felt, CRNAPre-anesthesia Checklist: Patient identified, Emergency Drugs available, Suction available and Patient being monitored Patient Re-evaluated:Patient Re-evaluated prior to induction Oxygen Delivery Method: Nasal cannula Induction Type: IV induction Placement Confirmation: positive ETCO2

## 2024-01-02 NOTE — Op Note (Addendum)
 [facility] Gastroenterology Patient Name: Patricia Ferguson Procedure Date: 01/02/2024 9:57 AM MRN: [id number] Account #: [id number] Date of Birth: [birth year] Admit Type: Outpatient Age: 73 Room: [facility] ENDO ROOM 1 Gender: Female Note Status: Supervisor Override Instrument Name: Veta [id number] Procedure:             Colonoscopy Indications:           Melena Providers:             Jayson KIDD. Marinda, MD Referring MD:          Jolene T. Cannady (Referring MD) Medicines:             See the Anesthesia note for documentation of the                         administered medications Complications:         No immediate complications. Estimated blood loss: None. Procedure:             Pre-Anesthesia Assessment:                        - Prior to the procedure, a History and Physical was                         performed, and patient medications and allergies were                         reviewed. The patient's tolerance of previous                         anesthesia was also reviewed. The risks and benefits                         of the procedure and the sedation options and risks                         were discussed with the patient. All questions were                         answered, and informed consent was obtained. Prior                         Anticoagulants: The patient has taken no anticoagulant                         or antiplatelet agents. ASA Grade Assessment: III - A                         patient with severe systemic disease. After reviewing                         the risks and benefits, the patient was deemed in                         satisfactory condition to undergo the procedure.                        After obtaining informed consent, the colonoscope was  passed under direct vision. Throughout the procedure,                         the patient's blood pressure, pulse, and oxygen                         saturations were monitored  continuously. The                         Colonoscope was introduced through the anus and                         advanced to the the cecum, identified by appendiceal                         orifice and ileocecal valve. The colonoscopy was                         technically difficult and complex due to a redundant                         colon, significant looping and a tortuous colon.                         Successful completion of the procedure was aided by                         applying abdominal pressure. Findings:      The colon (entire examined portion) appeared normal. Impression:            - The rectum is normal.                        - No specimens collected. Recommendation:        - Repeat colonoscopy at appointment to be scheduled                         for screening purposes. Procedure Code(s):     --- Professional ---                        [phone], Colonoscopy, flexible; diagnostic, including                         collection of specimen(s) by brushing or washing, when                         performed (separate procedure) CPT copyright 2022 American Medical Association. All rights reserved. The codes documented in this report are preliminary and upon coder review may  be revised to meet current compliance requirements. Jayson MALVA Endow, MD 01/02/2024 11:24:06 AM Number of Addenda: 0 Note Initiated On: 01/02/2024 9:57 AM Scope Withdrawal Time: 0 hours 7 minutes 11 seconds  Total Procedure Duration: 0 hours 42 minutes 33 seconds  Estimated Blood Loss:  Estimated blood loss: none.      [facility]

## 2024-01-02 NOTE — Anesthesia Preprocedure Evaluation (Addendum)
 Anesthesia Evaluation  Patient identified by MRN, date of birth, ID band Patient awake    Reviewed: Allergy & Precautions, NPO status , Patient's Chart, lab work & pertinent test results  History of Anesthesia Complications Negative for: history of anesthetic complications  Airway Mallampati: I   Neck ROM: Full    Dental  (+) Missing   Pulmonary neg pulmonary ROS   Pulmonary exam normal breath sounds clear to auscultation       Cardiovascular Exercise Tolerance: Good Normal cardiovascular exam Rhythm:Regular Rate:Normal  ECG 05/11/23: Sinus bradycardia   Neuro/Psych negative neurological ROS     GI/Hepatic hiatal hernia,GERD  ,,  Endo/Other  Obesity   Renal/GU negative Renal ROS     Musculoskeletal  (+) Arthritis ,    Abdominal   Peds  Hematology negative hematology ROS (+)   Anesthesia Other Findings   Reproductive/Obstetrics                             Anesthesia Physical Anesthesia Plan  ASA: 2  Anesthesia Plan: General   Post-op Pain Management:    Induction: Intravenous  PONV Risk Score and Plan: 3 and Propofol  infusion, TIVA and Treatment may vary due to age or medical condition  Airway Management Planned: Natural Airway  Additional Equipment:   Intra-op Plan:   Post-operative Plan:   Informed Consent: I have reviewed the patients History and Physical, chart, labs and discussed the procedure including the risks, benefits and alternatives for the proposed anesthesia with the patient or authorized representative who has indicated his/her understanding and acceptance.       Plan Discussed with: CRNA  Anesthesia Plan Comments: (LMA/GETA backup discussed.  Patient consented for risks of anesthesia including but not limited to:  - adverse reactions to medications - damage to eyes, teeth, lips or other oral mucosa - nerve damage due to positioning  - sore throat or  hoarseness - damage to heart, brain, nerves, lungs, other parts of body or loss of life  Informed patient about role of CRNA in peri- and intra-operative care.  Patient voiced understanding.)       Anesthesia Quick Evaluation

## 2024-01-03 ENCOUNTER — Encounter: Payer: Self-pay | Admitting: General Surgery

## 2024-01-03 LAB — SURGICAL PATHOLOGY

## 2024-01-03 NOTE — Anesthesia Postprocedure Evaluation (Signed)
 Anesthesia Post Note  Patient: Patricia Ferguson  Procedure(s) Performed: EGD (ESOPHAGOGASTRODUODENOSCOPY) COLONOSCOPY  Patient location during evaluation: PACU Anesthesia Type: General Level of consciousness: awake and alert, oriented and patient cooperative Pain management: pain level controlled Vital Signs Assessment: post-procedure vital signs reviewed and stable Respiratory status: spontaneous breathing, nonlabored ventilation and respiratory function stable Cardiovascular status: blood pressure returned to baseline and stable Postop Assessment: adequate PO intake Anesthetic complications: no   No notable events documented.   Last Vitals:  Vitals:   01/02/24 1126 01/02/24 1136  BP: 128/70 (!) 122/98  Pulse: 81 79  Resp: 20 (!) 24  Temp:    SpO2: 97% 100%    Last Pain:  Vitals:   01/02/24 1136  TempSrc:   PainSc: 0-No pain                 Dorothey Gate

## 2024-01-10 ENCOUNTER — Ambulatory Visit (INDEPENDENT_AMBULATORY_CARE_PROVIDER_SITE_OTHER): Admitting: Nurse Practitioner

## 2024-01-21 ENCOUNTER — Other Ambulatory Visit

## 2024-01-21 DIAGNOSIS — E782 Mixed hyperlipidemia: Secondary | ICD-10-CM

## 2024-01-21 DIAGNOSIS — K921 Melena: Secondary | ICD-10-CM

## 2024-01-21 DIAGNOSIS — K449 Diaphragmatic hernia without obstruction or gangrene: Secondary | ICD-10-CM

## 2024-01-21 DIAGNOSIS — R7301 Impaired fasting glucose: Secondary | ICD-10-CM

## 2024-01-22 ENCOUNTER — Ambulatory Visit: Payer: Self-pay | Admitting: Nurse Practitioner

## 2024-01-22 LAB — LIPID PANEL W/O CHOL/HDL RATIO
Cholesterol, Total: 149 mg/dL (ref 100–199)
HDL: 45 mg/dL (ref >39)
LDL Chol Calc (NIH): 88 mg/dL (ref 0–99)
Triglycerides: 81 mg/dL (ref 0–149)
VLDL Cholesterol Cal: 16 mg/dL (ref 5–40)

## 2024-01-22 LAB — CBC WITH DIFFERENTIAL/PLATELET
Basophils Absolute: 0.1 10*3/uL (ref 0.0–0.2)
Basos: 2 %
EOS (ABSOLUTE): 0.2 10*3/uL (ref 0.0–0.4)
Eos: 5 %
Hematocrit: 37.8 % (ref 34.0–46.6)
Hemoglobin: 12.1 g/dL (ref 11.1–15.9)
Immature Grans (Abs): 0 10*3/uL (ref 0.0–0.1)
Immature Granulocytes: 0 %
Lymphocytes Absolute: 1.1 10*3/uL (ref 0.7–3.1)
Lymphs: 34 %
MCH: 30 pg (ref 26.6–33.0)
MCHC: 32 g/dL (ref 31.5–35.7)
MCV: 94 fL (ref 79–97)
Monocytes Absolute: 0.3 10*3/uL (ref 0.1–0.9)
Monocytes: 11 %
Neutrophils Absolute: 1.5 10*3/uL (ref 1.4–7.0)
Neutrophils: 48 %
Platelets: 157 10*3/uL (ref 150–450)
RBC: 4.04 x10E6/uL (ref 3.77–5.28)
RDW: 12.5 % (ref 11.7–15.4)
WBC: 3.1 10*3/uL — ABNORMAL LOW (ref 3.4–10.8)

## 2024-01-22 LAB — FERRITIN: Ferritin: 23 ng/mL (ref 15–150)

## 2024-01-22 LAB — IRON: Iron: 123 ug/dL (ref 27–139)

## 2024-01-22 LAB — HEMOGLOBIN A1C
Est. average glucose Bld gHb Est-mCnc: 105 mg/dL
Hgb A1c MFr Bld: 5.3 % (ref 4.8–5.6)

## 2024-01-22 NOTE — Progress Notes (Signed)
 Contacted via MyChart  Good day Patricia Ferguson, overall labs improved this check. White blood cell count mildly low, but we monitor this as it can fluctuate.  Anemia improved.  I saw the plan is surgery for the hernia.  I am glad you came in and were assessed when you were.  Any questions? Keep being amazing!!  Thank you for allowing me to participate in your care.  I appreciate you. Kindest regards, Keiyana Stehr

## 2024-02-05 ENCOUNTER — Telehealth: Payer: Self-pay | Admitting: Podiatry

## 2024-02-05 NOTE — Telephone Encounter (Signed)
 PER [employer] FOR CPT [phone] HAS BEEN APPROVED.  DOS 02/07/24 VALID THRU 05/08/2024.SABRA  TRACKING # [id number]

## 2024-02-06 ENCOUNTER — Ambulatory Visit (INDEPENDENT_AMBULATORY_CARE_PROVIDER_SITE_OTHER): Admitting: Nurse Practitioner

## 2024-02-07 ENCOUNTER — Other Ambulatory Visit: Payer: Self-pay | Admitting: Podiatry

## 2024-02-07 DIAGNOSIS — M2042 Other hammer toe(s) (acquired), left foot: Secondary | ICD-10-CM | POA: Diagnosis not present

## 2024-02-07 MED ORDER — HYDROCODONE-ACETAMINOPHEN 10-325 MG PO TABS: 1.0000 | ORAL_TABLET | ORAL | 0 refills | Status: AC | PRN

## 2024-02-07 MED ORDER — IBUPROFEN 800 MG PO TABS: 800.0000 mg | ORAL_TABLET | Freq: Three times a day (TID) | ORAL | 1 refills | Status: DC

## 2024-02-07 NOTE — Progress Notes (Signed)
 PRN postop

## 2024-02-08 ENCOUNTER — Telehealth: Payer: Self-pay | Admitting: Podiatry

## 2024-02-08 NOTE — Telephone Encounter (Signed)
 Patient is in a lot of pain where surgery took place on left foot. Patient contact telephone number, [phone]. Patient is requesting to have provider or nurse contact her.

## 2024-02-15 ENCOUNTER — Ambulatory Visit (INDEPENDENT_AMBULATORY_CARE_PROVIDER_SITE_OTHER)

## 2024-02-15 ENCOUNTER — Ambulatory Visit (INDEPENDENT_AMBULATORY_CARE_PROVIDER_SITE_OTHER): Admitting: Podiatry

## 2024-02-15 VITALS — Ht 63.0 in | Wt 196.2 lb

## 2024-02-15 DIAGNOSIS — M2042 Other hammer toe(s) (acquired), left foot: Secondary | ICD-10-CM | POA: Diagnosis not present

## 2024-02-15 NOTE — Progress Notes (Signed)
 Chief Complaint  Patient presents with   Routine Post Op    POV # 1 DOS 02/01/24 LT HAMMERTOE REPAIR 2ND,3RD AND 4TH TOES, pt states her foot is feeling ok now, after surgery she had a lot of pain and pressure to the foot, was told to unwrap bandages and remove some of the dressing, she says that relieved a lot of the pressure, continues to wear camboot.    Subjective:  Patient presents today status post hammertoe repair digits 2, 3, 4 left foot.  DOS: 02/01/2024.  Doing well.  WBAT CAM boot.  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 SCREWS IN BACK   bone spur     BREAST BIOPSY Right 1980's   BREAST SURGERY     CERVICAL CONE BIOPSY     COLONOSCOPY N/A 01/02/2024   Procedure: COLONOSCOPY;  Surgeon: Marinda Jayson KIDD, MD;  Location: [facility] ENDOSCOPY;  Service: General;  Laterality: N/A;   COLONOSCOPY WITH PROPOFOL  N/A 06/02/2022   Procedure: COLONOSCOPY WITH PROPOFOL ;  Surgeon: Therisa Bi, MD;  Location: [facility] ENDOSCOPY;  Service: Gastroenterology;  Laterality: N/A;   ESOPHAGOGASTRODUODENOSCOPY N/A 01/02/2024   Procedure: EGD (ESOPHAGOGASTRODUODENOSCOPY);  Surgeon: Marinda Jayson KIDD, MD;  Location: [facility] ENDOSCOPY;  Service: General;  Laterality: N/A;   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   Adhesive [Tape] Rash    PAPER TAPE OKAY   Lipitor [Atorvastatin] Other (See Comments)    Muscle aches   Oxycodone Rash   Percodan [Oxycodone-Aspirin] Rash   Pravachol [Pravastatin Sodium] Other (See Comments)    Muscle aches    Propoxyphene Other (See Comments)    depression    Objective/Physical Exam Neurovascular status intact.  Incision well coapted with staples intact. No sign of infectious process noted. No dehiscence. No active bleeding noted.  Moderate edema noted to the surgical extremity.  Good alignment of the toes  Radiographic Exam LT foot 02/15/2024:  Arthroplasty noted to the IPJ digits 2, 3, 4 with percutaneous pin fixation.  Good alignment of the digital rays.  Assessment: 1. s/p hammertoe repair digits 2, 3, 4 LT foot. DOS: 02/01/2024   Plan of Care:  -Patient was evaluated. X-rays reviewed - Staples removed -Continue WBAT CAM boot as instructed -Return to clinic 2 weeks follow-up x-ray   Thresa EMERSON Sar, DPM [facility]  Dr. Thresa EMERSON Sar, DPM    [address]  [place], [place] [address]                Office [phone]  Fax [phone]

## 2024-02-20 ENCOUNTER — Encounter: Payer: Self-pay | Admitting: Surgery

## 2024-02-20 ENCOUNTER — Ambulatory Visit: Admitting: Surgery

## 2024-02-20 VITALS — BP 134/84 | HR 76 | Temp 98.2°F | Ht 63.0 in | Wt 189.2 lb

## 2024-02-20 DIAGNOSIS — K449 Diaphragmatic hernia without obstruction or gangrene: Secondary | ICD-10-CM | POA: Diagnosis not present

## 2024-02-20 NOTE — Progress Notes (Signed)
 Outpatient Surgical Follow Up  02/20/2024  Patricia Ferguson is an 73 y.o. female.   Chief Complaint  Patient presents with   Follow-up    Hiatal hernia    HPI: Patricia Ferguson is a 73 y.o. female seen in F/U for paraesophageal H.  Some atypical chest pain and discomofrt.She endorses some epigastric pain intermittent, dull and mild.  She endorses Reflux at night and worsening when supine. Some dyspnea on exertion . Denies dysphagia. She is Able to perform more than 4 METS w/o chest pain. Works as a Diplomatic Services operational officer at [employer], part time.  She is recovering from right foot surgery and is wearing a  boot. Prior abdominal operation include appendectomy and hysterectomy CT pers reviewed showing giant Type III paraesophageal H, worsening as compared to 9 years ago. 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 SCREWS IN BACK   bone spur     BREAST BIOPSY Right 1980's   BREAST SURGERY     CERVICAL CONE BIOPSY     COLONOSCOPY N/A 01/02/2024   Procedure: COLONOSCOPY;  Surgeon: Marinda Jayson KIDD, MD;  Location: [facility] ENDOSCOPY;  Service: General;  Laterality: N/A;   COLONOSCOPY WITH PROPOFOL  N/A 06/02/2022   Procedure: COLONOSCOPY WITH PROPOFOL ;  Surgeon: Therisa Bi, MD;  Location: [facility] ENDOSCOPY;  Service: Gastroenterology;  Laterality: N/A;   ESOPHAGOGASTRODUODENOSCOPY N/A 01/02/2024   Procedure:  EGD (ESOPHAGOGASTRODUODENOSCOPY);  Surgeon: Marinda Jayson KIDD, MD;  Location: [facility] ENDOSCOPY;  Service: General;  Laterality: N/A;   HAMMER TOE SURGERY Right 02/01/2023   lumbar fission     MASS EXCISION Left 01/07/2020   Procedure: EXCISION MASS (RESECTION OF ANTECUBITAL VENOUS ANEURYSM);  Surgeon: Jama Cordella MATSU, MD;  Location: [facility] ORS;  Service: Vascular;  Laterality: Left;   SPINE SURGERY  2003    Family History  Problem Relation Age of Onset   Diabetes Mother    Hyperlipidemia Mother    Hypertension Mother    Osteoporosis Mother    Arthritis Mother    Cancer Father        lung   Diabetes Father    Diabetes Sister    Hyperlipidemia Sister    Hypertension Sister    Hypertension Brother    Hyperlipidemia Sister    Breast cancer Neg Hx     Social History:  reports that she has never smoked. She has never used smokeless tobacco. She reports that she does not currently use alcohol. She reports that she does not use drugs.  Allergies:  Allergies  Allergen Reactions   Meloxicam  Other (See Comments)    Face got flushed and red.   Adhesive [Tape] Rash    PAPER TAPE OKAY   Lipitor [Atorvastatin] Other (See Comments)  Muscle aches   Oxycodone Rash   Percodan [Oxycodone-Aspirin] Rash   Pravachol [Pravastatin Sodium] Other (See Comments)    Muscle aches    Propoxyphene Other (See Comments)    depression    Medications reviewed.    ROS Full ROS performed and is otherwise negative other than what is stated in HPI   BP 134/84   Pulse 76   Temp 98.2 F (36.8 C) (Oral)   Ht 5' 3 (1.6 m)   Wt 189 lb 3.2 oz (85.8 kg)   LMP  (LMP Unknown)   SpO2 93%   BMI 33.52 kg/m   Physical Exam CONSTITUTIONAL: NAD BMI 33. EYES: Pupils are equal, round, and reactive to light, Sclera are non-icteric. EARS, NOSE, MOUTH AND THROAT: The oropharynx is clear. The oral mucosa is pink and moist. Hearing is intact to voice. LYMPH NODES:  Lymph nodes in the neck are  normal. RESPIRATORY:  Lungs are clear. There is normal respiratory effort, with equal breath sounds bilaterally, and without pathologic use of accessory muscles. CARDIOVASCULAR: Heart is regular without murmurs, gallops, or rubs. GI: The abdomen is  soft, nontender, and nondistended. There are no palpable masses. There is no hepatosplenomegaly. There are normal bowel sounds in all quadrants. GU: Rectal deferred.   MUSCULOSKELETAL: Normal muscle strength and tone. No cyanosis or edema.   SKIN: Turgor is good and there are no pathologic skin lesions or ulcers. NEUROLOGIC: Motor and sensation is grossly normal. Cranial nerves are grossly intact. PSYCH:  Oriented to person, place and time. Affect is normal.  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 I Discussed with patient in detail about her disease process.  She has completed w/u and I do it is amenable for robotic repair. D/W her what robotic repair will entail, risks, benefits and possible complications ( including but not limited to bleeding, infection, esophageal or bowel injuries, bloating) . Also d/w her about diet modifications  Her BMI has improved, she wishes to have her surgery in 6 weeks . I congratulated her on her weight loss process and encourage her to continue to work on it. We will see her next month and hopefully her husband will be present as well. I personally spent a total of 40 minutes in the care of the patient today including performing a medically appropriate exam/evaluation, counseling and educating, placing orders, referring and communicating with other health care professionals, documenting clinical information in the EHR, independently interpreting and reviewing images studies and coordinating care.   Laneta Luna, MD [facility] General Surgeon

## 2024-02-20 NOTE — Patient Instructions (Signed)
 We have spoken today about repairing your Hiatal Hernia. Your surgery will be scheduled at [facility] with Dr. Dana Duncan.  If you are on any injectable weight loss medication, you will need to stop taking your GLP-1 injectable (weight loss) medications 8 days before your surgery to avoid any complications with anesthesia.   Plan to be in the hospital for 1-2 days if the minimally invasive surgery is completed without having to make a bigger incision. If the bigger incision is made, you will most likely need to be in the hospital 4-6 days. You will be on a soft diet and need to recover for 2 weeks following your surgery prior to doing any of your normal activities. At the 2 week mark, we will see you in the office and if you are doing ok we will advance your diet and activity level as you tolerate.  Please see your Blue (Pre-care) Sheet for more information regarding your surgery. Our surgery scheduler will call you to verify surgery date and to go over information  You will need to arrange to be out of work for approximately 1-2 weeks and then you may return with a lifting restriction for 4 more weeks. If you have FMLA or Disability paperwork that needs to be filled out, please have your company fax your paperwork to [phone] or you may drop this by either office. This paperwork will be filled out within 3 days after your surgery has been completed.  Please call our office with any questions or concerns that you have regarding your surgery and recovery.    Laparoscopic Nissen Fundoplication Laparoscopic Nissen fundoplication is surgery to relieve heartburn and other problems caused by gastric fluids flowing up into your esophagus. The esophagus is the tube that carries food and liquid from your throat to your stomach. Normally, the muscle that sits between your stomach and your esophagus (lower esophageal sphincter or LES) keeps stomach fluids in your stomach. In some people, the LES does not work  properly, and stomach fluids flow up into the esophagus. This can happen when part of the stomach bulges through the LES (hiatal hernia). The backward flow of stomach fluids can cause a type of severe and long-standing heartburn that is called gastroesophageal reflux disease (GERD). You may need this surgery if other treatments for GERD have not helped. In a laparoscopic Nissen fundoplication, the upper part of your stomach is wrapped around the lower part of your esophagus to strengthen the LES and prevent reflux. If you have a hiatal hernia, it will also be repaired with this surgery. The procedure is done through several small incisions in your abdomen. It is performed using a thin, telescopic instrument (laparoscope) and other instruments that can pass through the scope or through other small incisions. Tell a health care provider about: Any allergies you have. All medicines you are taking, including vitamins, herbs, eye drops, creams, and over-the-counter medicines. Any problems you or family members have had with anesthetic medicines. Any blood disorders you have. Any surgeries you have had. Any medical conditions you have. What are the risks? Generally, this is a safe procedure. However, problems may occur, including: Difficulty swallowing (dysphagia). Bloating. Nausea or vomiting. Damage to the lung, causing a collapsed lung. Infection or bleeding. What happens before the procedure? Ask your health care provider about: Changing or stopping your regular medicines. This is especially important if you are taking diabetes medicines or blood thinners. Taking medicines such as aspirin and ibuprofen. These medicines can thin your  blood. Do not take these medicines before your procedure if your health care provider asks you not to. Follow your health care provider's instructions about eating or drinking restrictions. Plan to have someone take you home after the procedure. What happens during  the procedure? An IV tube will be inserted into one of your veins. It will be used to give you fluids and medicines during the procedure. You will be given a medicine that makes you fall asleep (general anesthetic). Your abdomen will be cleaned with a germ-killing solution (antiseptic). The surgeon will make a small incision in your abdomen and insert a tube through the incision. Your abdomen will be filled with a gas. This helps the surgeon to see your organs more easily and it makes more space to work. The surgeon will insert the laparoscope through the incision. The scope has a camera that will send pictures to a monitor in the operating room. The surgeon will make several other small incisions in your abdomen to insert the other instruments that are needed during the procedure. Another instrument (dilator) will be passed through your mouth and down your esophagus into the upper part of your stomach. The dilator will prevent your LES from being closed too tightly during surgery. The surgeon will pass the top portion of your stomach behind the lower part of your esophagus and wrap it all the way around. This will be stitched into place. If you have a hiatal hernia, it will be repaired during this procedure. All instruments will be removed, and your incisions will be closed under your skin with stitches (sutures). Skin adhesive strips may also be used. A bandage (dressing) will be placed on your skin over the incisions. The procedure may vary among health care providers and hospitals. What happens after the procedure? You will be moved to a recovery area. Your blood pressure, heart rate, breathing rate, and blood oxygen level will be monitored often until the medicines you were given have worn off. You will be given pain medicine as needed. Your IV tube will be kept in until you are able to drink fluids. This information is not intended to replace advice given to you by your health care provider.  Make sure you discuss any questions you have with your health care provider. Document Released: 07/31/2014 Document Revised: 12/16/2015 Document Reviewed: 03/11/2014 Elsevier Interactive Patient Education  2017 [employer]   Laparoscopic Nissen Fundoplication, Care After Refer to this sheet in the next few weeks. These instructions provide you with information about caring for yourself after your procedure. Your health care provider may also give you more specific instructions. Your treatment has been planned according to current medical practices, but problems sometimes occur. Call your health care provider if you have any problems or questions after your procedure. What can I expect after the procedure? After the procedure, it is common to have: Difficulty swallowing (dysphagia). Excess gas (bloating). Follow these instructions at home: Medicines  Take medicines only as directed by your health care provider. Do not drive or operate heavy machinery while taking pain medicine. Incision care  There are many different ways to close and cover an incision, including stitches (sutures), skin glue, and adhesive strips. Follow your health care provider's instructions about: Incision care. Bandage (dressing) changes and removal. Incision closure removal. Check your incision areas every day for signs of infection. Watch for: Redness, swelling, or pain. Fluid, blood, or pus. Do not take baths, swim, or use a hot tub until your health care  provider approves. Take showers as directed by your health care provider. Eating and drinking  Follow your health care provider's instructions about eating. You may need to follow a very soft diet for 2 weeks, followed by a diet of more regular foods for 2 weeks, no breads. You should return to your usual diet gradually. Drink enough fluid to keep your urine clear or pale yellow. Activity  Return to your normal activities as directed by your health care  provider. Ask your health care provider what activities are safe for you. Avoid strenuous exercise. Do not lift anything that is heavier than 10 lb (4.5 kg). Ask your health care provider when you can: Return to sexual activity. Drive. Go back to work. Contact a health care provider if: You have a fever. Your pain gets worse or is not helped by medicine. You have frequent nausea or vomiting. You have continued abdominal bloating. You have an ongoing (persistent) cough. You have redness, swelling, or pain in any incision areas. You have fluid, blood, or pus coming from any incisions. Get help right away if: You have trouble breathing. You are unable to swallow. You have persistent vomiting. You have blood in your vomit. You have severe abdominal pain. This information is not intended to replace advice given to you by your health care provider. Make sure you discuss any questions you have with your health care provider. Document Released: 03/02/2004 Document Revised: 12/16/2015 Document Reviewed: 03/11/2014 Elsevier Interactive Patient Education  2017 Elsevier Inc.   Diet After Nissen Fundoplication Surgery This diet information is for patients who have recently had Nissen fundoplication surgery to correct reflux disease or to repair various types of hernias, such as hiatal hernia and intrathoracic stomach. This diet may also be used for other gastrointestinal surgeries, such as Heller myotomy and repair of achalasia. The diet will help control diarrhea, excess gas and swallowing problems, which may occur after this type of surgery. Keeping Your Stomach from Stretching Eat small, frequent meals (six to eight per day). This will help you consume the majority of the nutrients you need without causing your stomach to feel full or distended.  Drinking large amounts of fluids with meals can stretch your stomach. You may drink fluids between meals as often as you like, but limit fluids to 1/2  cup (4 fluid ounces) with meals and one cup (8 fluid ounces) with snacks.  Sit upright while eating and stay upright for 30 minutes after each meal. Gravity can help food move through your digestive tract. Do not lie down after eating. Sit upright for 2 hours after your last meal or snack of the day.  Eat very slowly. Take your time when eating.  Take small bites and chew your food well to help aid in swallowing and digestion.  Avoid crusty breads and sticky, gummy foods, such as bananas, fresh doughy breads, rolls and doughnuts. These types of foods become sticky and difficult to swallow.  Toasted breads tend to be better tolerated.  Lastly, if you eat sweets, consume them at the end of your meal to avoid a group of symptoms referred to as "dumping syndrome". This describes the rapid emptying of foods from the stomach to the small intestine. Sweetened beverages, candy and desserts move more rapidly and dump quickly into the intestines. This can cause symptoms of nausea, weakness, cold sweats, cramps, diarrhea and dizzy spells.  Avoiding Gas Avoid drinking through a straw. Do not chew gum or tobacco. These actions cause you to swallow  air, which produces excess gas in your stomach. Chew with your mouth closed.  Avoid any foods that cause stomach gas and distention. These foods include corn, dried beans, peas, lentils, onions, broccoli, cauliflower and any food from the cabbage family.  Avoid carbonated drinks, alcohol, citrus and tomato products.  When will I be able to eat a soft diet? After Nissen fundoplication surgery, your diet will be advanced slowly by your surgeon. Generally, you will be on a clear liquid diet for the first few meals. Then you will advance to the full liquid diet for a meal or two and eventually to a Nissen soft diet. Please be aware that each patient's tolerance to food is different. Your doctor will advance your diet depending on how well you progress after surgery. Clear  Liquid Diet  The first diet after surgery is the clear liquid diet. It includes the following liquids: Apple juice  Cranberry juice  Grape juice  Chicken broth  Beef broth  Flavored gelatin (Jell-O)  Decaf tea and coffee  Caffeinated beverages are permitted based on tolerance  Popsicles  [place] ice Carbonated drinks (sodas) are not allowed for the first six to eight weeks after surgery. After this time you can try them again in small amounts.  Full Liquid Diet The full liquid diet contains anything on the clear liquid diet, plus: Milk, soy, rice and almond (no chocolate)  Cream of wheat, cream of rice, grits  Strained creamed soups (no tomato or broccoli)  Vanilla and strawberry-flavored ice cream  Sherbet  Blended, custard styled or whipped yogurt (plain or vanilla only)  Vanilla and butterscotch pudding (no chocolate or coconut)  Nutritional drinks including Ensure, Boost, Carnation Instant Breakfast (no chocolate-flavored) Note: Dairy products, such as milk, ice cream and pudding, may cause diarrhea in some people just after surgery. You may need to avoid milk products. If so, substitute them with lactose-free beverages, such as soy, rice, Lactaid or almond milks.  Nissen Soft Diet Food Category Foods to Choose Foods to Avoid  Beverages Milk, such as, whole, 2%, 1%, non-fat, or skim, soy, rice, almond  Caffeinated and decaf tea and coffee  Powdered drink mixes (in moderation)  Non-citrus juices (apple, grape, cranberry or blends of these)  Fruit nectars  Nutritional drinks including Boost, Ensure, Carnation Instant Breakfast Chocolate milk, cocoa or other chocolate-flavored drinks  Carbonated drinks  Alcohol  Citrus juices like orange, grapefruit, lemon and lime  Breads Crackers (saltine, butter, soda, graham, Goldfish and Cheese Nips)   Untoasted bread, bagels, [place] and hard rolls, English muffins  Crackers with nuts, seeds, fresh or dried fruit, coconut, or  highly seasoned, such as garlic or onion-flavored  Sweet rolls, coffee cake or doughnuts  Cereals Well cooked cereals, such as oatmeal (plain or flavored)  Cold cereal (Cornflakes, Rice Krispies, Cheerios, Special K plain, Rice Chex and puffed rice) Very coarse cereal, such as bran, shredded wheat  Any cereal with fresh or dried fruit, coconut, seeds or nuts  Desserts Eat in moderation and do not eat desserts or sweets by themselves. Plain cakes, cookies and cream-filled pies  Vanilla and butterscotch pudding or custard  Ice cream, ice milk, frozen yogurt and sherbet  Gelatin made from allowed foods  Fruit ices and popsicles Desserts containing chocolate, coconut, nuts, seeds, fresh or dried fruit, peppermint or spearmint  Eggs  Poached, hard boiled or scrambled Fried eggs and highly seasoned eggs (deviled eggs)  Fats Eat in moderation. Butter and margarine  Mayonnaise and vegetable oils  Mildly seasoned cream sauces and gravies  Plain cream cheese  Sour cream Highly seasoned salad dressings, cream sauces and gravies  Bacon, bacon fat, ham fat, lard and salt pork  Fried foods  Nuts  Fruits Fruit juice  Any canned or cooked fruit except those listed in the AVOID column ALL fresh fruits, such as citrus, apples, and pineapple  Canned pineapple  Dried fruits, such as raisins, berries  Fruits with seeds, such as berries, kiwi and figs  Meat, Fish, Poultry, and Dairy Products Meats may be ground, minced or chopped to ease swallowing and digestion  Tender, well cooked and moist cuts of beef, chicken, [place] and pork  Veal and lamb  Flaky, cooked fish  Canned tuna  Cottage and ricotta cheeses  Mild cheese, such as American, brick, mozzarella and baby Swiss  Creamy peanut butter  Plain custard or blended fruit yogurt  Moist casseroles, such as macaroni & cheese, tuna noodle  Grilled or toasted cheese sandwich Tough meats with a lot of gristle  Fried, highly seasoned, smoked and fatty  meat, fish or poultry, such as frankfurters, luncheon meats, sausage, bacon, spare ribs, beef brisket, sardines, anchovies, duck and goose  Chili and other entrees made with pepper or chili pepper  Shellfish  Strongly flavored cheeses, such as sharp cheese, extra sharp cheddar, cheese containing peppers or other seasonings  Crunchy peanut butter  Any yogurt with nuts, seeds, coconut, strawberries or raspberries  Potatoes and Starches Peeled, mashed or boiled white or sweet potatoes  Oven-baked potatoes without skin  Well cooked white rice, enriched noodles, barley, spaghetti, macaroni and other pastas Fried potatoes, potato skins and potato chips  Hard and soft taco shells  Fried, brown or wild rice  Soups Mildly flavored meat stocks  Cream soups made from allowed foods Highly seasoned soups and tomato based soups, cream soups made with gas producing vegetables, such as broccoli, cauliflower, onion, etc.  Sweets and Snacks Use in moderation and do not eat large amounts of sweets by themselves. Syrup, honey, jelly and seedless jam  Plain hard candies and plain candies made with allowed ingredients  Molasses  Marshmallows  Other candy made from allowed ingredients  Thin pretzels Jam, marmalade and preserves  Chocolate in any form  Any candy containing nuts, coconut, seeds, peppermint, spearmint or dried or fresh fruit  Popcorn, potato chips, tortilla chips  Soft or hard thick pretzels, such as sourdough  Vegetables Well cooked soft vegetables without seeds or skins, such as asparagus tips, beets, carrots, green and wax beans, chopped spinach, tender canned baby peas, squash and pumpkin Raw vegetables, tomatoes, tomato juice, tomato sauce and V-8 juice(tomato based products can irritate the stomach)  Gas producing vegetables, such as broccoli, Brussel sprouts, cabbage, cauliflower, onions, corn, cucumber, green peppers, rutabagas, turnips, radishes and sauerkraut  Dried beans, peas and  lentils  Miscellaneous Salt and spices in moderation  Mustard and vinegar in moderation Fried or highly seasoned foods  Coconut and seeds  Pickles and olives  Chili sauces, ketchup, barbecue sauce, horseradish, black pepper, chili powder and onion and garlic seasonings  Any other strongly flavored seasoning, condiment, spice or herb not tolerated  Any food not tolerated

## 2024-02-22 ENCOUNTER — Ambulatory Visit: Admitting: Podiatry

## 2024-02-22 ENCOUNTER — Encounter: Payer: Self-pay | Admitting: Podiatry

## 2024-02-22 ENCOUNTER — Ambulatory Visit

## 2024-02-22 VITALS — Ht 63.0 in | Wt 189.2 lb

## 2024-02-22 DIAGNOSIS — M2042 Other hammer toe(s) (acquired), left foot: Secondary | ICD-10-CM

## 2024-02-22 MED ORDER — DOXYCYCLINE HYCLATE 100 MG PO TABS: 100.0000 mg | ORAL_TABLET | Freq: Two times a day (BID) | ORAL | 0 refills | Status: DC

## 2024-02-22 NOTE — Progress Notes (Addendum)
 Chief Complaint  Patient presents with   Routine Post Op    POV # 2 DOS 02/01/24 LT HAMMERTOE REPAIR 2ND,3RD AND 4TH TOES, pt is here to f/u on left foot after surgery, she states everything is going well, has some pain thinks it may due to the staples, no other complaints.     Subjective:  Patient presents today status post hammertoe repair digits 2, 3, 4 left foot.  DOS: 02/01/2024.  Continues to do well.  Minimal pain.  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 SCREWS IN BACK   bone spur     BREAST BIOPSY Right 1980's   BREAST SURGERY     CERVICAL CONE BIOPSY     COLONOSCOPY N/A 01/02/2024   Procedure: COLONOSCOPY;  Surgeon: Marinda Jayson KIDD, MD;  Location: [facility] ENDOSCOPY;  Service: General;  Laterality: N/A;   COLONOSCOPY WITH PROPOFOL  N/A 06/02/2022   Procedure: COLONOSCOPY WITH PROPOFOL ;  Surgeon: Therisa Bi, MD;  Location: [facility] ENDOSCOPY;  Service: Gastroenterology;  Laterality: N/A;   ESOPHAGOGASTRODUODENOSCOPY N/A 01/02/2024   Procedure: EGD (ESOPHAGOGASTRODUODENOSCOPY);  Surgeon: Marinda Jayson KIDD, MD;  Location: [facility] ENDOSCOPY;  Service: General;  Laterality: N/A;   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   Adhesive [Tape] Rash    PAPER TAPE OKAY   Lipitor [Atorvastatin] Other (See Comments)    Muscle aches   Oxycodone Rash   Percodan [Oxycodone-Aspirin] Rash   Pravachol [Pravastatin Sodium] Other (See Comments)    Muscle aches    Propoxyphene Other (See Comments)    depression    Objective/Physical Exam Neurovascular status intact.  Incision well coapted with staples intact.  No active bleeding.  There is a small area over the second digit along the incision site that does have some slight dehiscence with scant serous drainage.  Radiographic Exam LT foot 02/15/2024:  Arthroplasty noted to the IPJ digits 2, 3, 4 with percutaneous pin fixation.  Good alignment of the digital rays.  Assessment: 1. s/p hammertoe repair digits 2, 3, 4 LT foot. DOS: 02/01/2024   Plan of Care:  -Patient was evaluated.  -Staples removed.  Betadine wet-to-dry dressings applied -Continue WBAT CAM boot -Prescription for doxycycline 100 mg twice daily x 10 days due to the very small area of dehiscence overlying the PIPJ of the second digit with scant serous drainage -Return to clinic 2 weeks percutaneous pin removal.  Follow-up x-rays after percutaneous pin removal   Thresa EMERSON Sar, DPM [facility]  Dr. Thresa EMERSON Sar, DPM  [address] [place], [place] [address]                Office [phone]  Fax [phone]

## 2024-02-22 NOTE — Addendum Note (Signed)
 Addended by: JANIT THRESA HERO on: 02/22/2024 11:44 AM   Modules accepted: Orders

## 2024-02-26 ENCOUNTER — Telehealth: Payer: Self-pay | Admitting: Surgery

## 2024-02-26 NOTE — Telephone Encounter (Signed)
 Patient has been advised of Pre-Admission date/time, and Surgery date at [facility].  Surgery Date: 04/01/24 Preadmission Testing Date: 03/25/24 (phone 8a-1p)  Patient informed of the scheduling process and surgery information given at time of office visit.   Patient has been made aware to call [phone], between 1-3:00pm the day before surgery, to find out what time to arrive for surgery.

## 2024-03-06 ENCOUNTER — Telehealth: Payer: Self-pay | Admitting: *Deleted

## 2024-03-06 NOTE — Telephone Encounter (Signed)
 Faxed FMLA [phone] [facility]

## 2024-03-07 ENCOUNTER — Ambulatory Visit (INDEPENDENT_AMBULATORY_CARE_PROVIDER_SITE_OTHER)

## 2024-03-07 ENCOUNTER — Encounter: Payer: Self-pay | Admitting: Podiatry

## 2024-03-07 ENCOUNTER — Ambulatory Visit (INDEPENDENT_AMBULATORY_CARE_PROVIDER_SITE_OTHER): Admitting: Podiatry

## 2024-03-07 VITALS — Ht 63.0 in | Wt 189.2 lb

## 2024-03-07 DIAGNOSIS — M2042 Other hammer toe(s) (acquired), left foot: Secondary | ICD-10-CM

## 2024-03-07 NOTE — Progress Notes (Signed)
 Chief Complaint  Patient presents with   Routine Post Op    POV # 3 DOS 02/01/24 LT HAMMERTOE REPAIR 2ND,3RD AND 4TH TOES, pt states that everything is going well still some swelling and soreness.    Subjective:  Patient presents today status post hammertoe repair digits 2, 3, 4 left foot.  DOS: 02/01/2024.  Continues to do well.  Minimal pain.  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 SCREWS IN BACK   bone spur     BREAST BIOPSY Right 1980's   BREAST SURGERY     CERVICAL CONE BIOPSY     COLONOSCOPY N/A 01/02/2024   Procedure: COLONOSCOPY;  Surgeon: Marinda Jayson KIDD, MD;  Location: [facility] ENDOSCOPY;  Service: General;  Laterality: N/A;   COLONOSCOPY WITH PROPOFOL  N/A 06/02/2022   Procedure: COLONOSCOPY WITH PROPOFOL ;  Surgeon: Therisa Bi, MD;  Location: [facility] ENDOSCOPY;  Service: Gastroenterology;  Laterality: N/A;   ESOPHAGOGASTRODUODENOSCOPY N/A 01/02/2024   Procedure: EGD (ESOPHAGOGASTRODUODENOSCOPY);  Surgeon: Marinda Jayson KIDD, MD;  Location: [facility] ENDOSCOPY;  Service: General;  Laterality: N/A;   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    Adhesive [Tape] Rash    PAPER TAPE OKAY   Lipitor [Atorvastatin] Other (See Comments)    Muscle aches   Oxycodone Rash   Percodan [Oxycodone-Aspirin] Rash   Pravachol [Pravastatin Sodium] Other (See Comments)    Muscle aches    Propoxyphene Other (See Comments)    depression    Objective/Physical Exam Neurovascular status intact.  Incisions are nicely healed.  There is some heavy edema noted to the surgical toes.  No erythema however.  Clinically there is no indication of infection  Radiographic Exam LT foot 03/07/2024:  Arthroplasty noted to the IPJ digits 2, 3, 4 with percutaneous fixation pins removed.  Good alignment of the digital rays.  Assessment: 1. s/p hammertoe repair digits 2, 3, 4 LT foot. DOS: 02/01/2024   Plan of Care:  -Patient was evaluated.  - Percutaneous fixation pins removed today -Postop shoe dispensed.  WBAT until the swelling in the toes can be reduced and she can wear regular tennis shoes -Continue reduced activity over the next 4 weeks to help with the edema to the toes -Return to clinic 6 weeks follow-up x-ray  Thresa EMERSON Sar, DPM [facility]  Dr. Thresa EMERSON Sar, DPM    [address]  [place], [place] [address]                Office [phone]  Fax [phone]

## 2024-03-24 ENCOUNTER — Other Ambulatory Visit: Payer: Self-pay | Admitting: Nurse Practitioner

## 2024-03-24 ENCOUNTER — Other Ambulatory Visit

## 2024-03-25 ENCOUNTER — Encounter
Admission: RE | Admit: 2024-03-25 | Discharge: 2024-03-25 | Disposition: A | Discharge status: Home / Self Care | Source: Ambulatory Visit | Attending: Surgery | Admitting: Surgery

## 2024-03-25 ENCOUNTER — Other Ambulatory Visit: Payer: Self-pay

## 2024-03-25 VITALS — Ht 63.0 in | Wt 189.0 lb

## 2024-03-25 DIAGNOSIS — K449 Diaphragmatic hernia without obstruction or gangrene: Secondary | ICD-10-CM

## 2024-03-25 DIAGNOSIS — Z01818 Encounter for other preprocedural examination: Secondary | ICD-10-CM

## 2024-03-25 NOTE — Patient Instructions (Addendum)
 Your procedure is scheduled on: TUESDAY 04/01/24  Report to the Registration Desk on the 1st floor of the [facility]. To find out your arrival time, please call [phone] between 1PM - 3PM on: MONDAY 03/31/24 If your arrival time is 6:00 am, do not arrive before that time as the Medical Mall entrance doors do not open until 6:00 am.  REMEMBER: Instructions that are not followed completely may result in serious medical risk, up to and including death; or upon the discretion of your surgeon and anesthesiologist your surgery may need to be rescheduled.  Do not eat food after midnight the night before surgery.  No gum chewing or hard candies.  You may however, drink CLEAR liquids up to 2 hours before you are scheduled to arrive for your surgery. Do not drink anything within 2 hours of your scheduled arrival time.  Clear liquids include: - water  - apple juice without pulp - gatorade (not RED colors) - black coffee or tea (Do NOT add milk or creamers to the coffee or tea) Do NOT drink anything that is not on this list.   In addition, your doctor has ordered for you to drink the provided:  Ensure Pre-Surgery Clear Carbohydrate Drink  Drinking this carbohydrate drink up to two hours before surgery helps to reduce insulin resistance and improve patient outcomes. Please complete drinking 2 hours before scheduled arrival time.  One week prior to surgery: Stop Anti-inflammatories (NSAIDS) such as Advil , Aleve, Ibuprofen , Motrin , Naproxen, Naprosyn and Aspirin based products such as Excedrin, Goody's Powder, BC Powder.  Stop ANY OVER THE COUNTER supplements until after surgery.  You may however, continue to take Tylenol  if needed for pain up until the day of surgery.  Continue taking all of your other prescription medications up until the day of surgery.  ON THE DAY OF SURGERY ONLY TAKE THESE MEDICATIONS WITH SIPS OF WATER:  ezetimibe  (ZETIA )  rosuvastatin  (CRESTOR )  famotidine  (PEPCID )    Use inhalers on the day of surgery and bring to the hospital.  Fleets enema or bowel prep as directed.  No Alcohol for 24 hours before or after surgery.  No Smoking including e-cigarettes for 24 hours before surgery.  No chewable tobacco products for at least 6 hours before surgery.  No nicotine patches on the day of surgery.  Do not use any recreational drugs for at least a week (preferably 2 weeks) before your surgery.  Please be advised that the combination of cocaine and anesthesia may have negative outcomes, up to and including death. If you test positive for cocaine, your surgery will be cancelled.  On the morning of surgery brush your teeth with toothpaste and water, you may rinse your mouth with mouthwash if you wish. Do not swallow any toothpaste or mouthwash.  Use CHG Soap or wipes as directed on instruction sheet.  Do not wear jewelry, make-up, hairpins, clips or nail polish.  For welded (permanent) jewelry: bracelets, anklets, waist bands, etc.  Please have this removed prior to surgery.  If it is not removed, there is a chance that hospital personnel will need to cut it off on the day of surgery.  Do not wear lotions, powders, or perfumes.   Do not shave body hair from the neck down 48 hours before surgery.  Contact lenses, hearing aids and dentures may not be worn into surgery.  Do not bring valuables to the hospital. [facility] is not responsible for any missing/lost belongings or valuables.   Bring your  C-PAP to the hospital in case you may have to spend the night.   Notify your doctor if there is any change in your medical condition (cold, fever, infection).  Wear comfortable clothing (specific to your surgery type) to the hospital.  After surgery, you can help prevent lung complications by doing breathing exercises.  Take deep breaths and cough every 1-2 hours. Your doctor may order a device called an Incentive Spirometer to help you take deep  breaths. When coughing or sneezing, hold a pillow firmly against your incision with both hands. This is called "splinting." Doing this helps protect your incision. It also decreases belly discomfort.  If you are being admitted to the hospital overnight, leave your suitcase in the car. After surgery it may be brought to your room.  In case of increased patient census, it may be necessary for you, the patient, to continue your postoperative care in the Same Day Surgery department.  If you are being discharged the day of surgery, you will not be allowed to drive home. You will need a responsible individual to drive you home and stay with you for 24 hours after surgery.   If you are taking public transportation, you will need to have a responsible individual with you.  Please call the Pre-admissions Testing Dept. at [phone] if you have any questions about these instructions.  Surgery Visitation Policy:  Patients having surgery or a procedure may have two visitors.  Children under the age of 50 must have an adult with them who is not the patient.  Inpatient Visitation:    Visiting hours are 7 a.m. to 8 p.m. Up to four visitors are allowed at one time in a patient room. The visitors may rotate out with other people during the day.  One visitor age 85 or older may stay with the patient overnight and must be in the room by 8 p.m.  Merchandiser, retail to address health-related social needs:  [email or web address]                                                                                                             Preparing for Surgery with CHLORHEXIDINE  GLUCONATE (CHG) Soap  Chlorhexidine  Gluconate (CHG) Soap  o An antiseptic cleaner that kills germs and bonds with the skin to continue killing germs even after washing  o Used for showering the night before surgery and morning of surgery  Before surgery, you can play an important role by reducing the  number of germs on your skin.  CHG (Chlorhexidine  gluconate) soap is an antiseptic cleanser which kills germs and bonds with the skin to continue killing germs even after washing.  Please do not use if you have an allergy to CHG or antibacterial soaps. If your skin becomes reddened/irritated stop using the CHG.  1. Shower the NIGHT BEFORE SURGERY and the MORNING OF SURGERY with CHG soap.  2. If you choose to wash your hair, wash your hair first as usual with your normal shampoo.  3. After shampooing, rinse your hair  and body thoroughly to remove the shampoo.  4. Use CHG as you would any other liquid soap. You can apply CHG directly to the skin and wash gently with a scrungie or a clean washcloth.  5. Apply the CHG soap to your body only from the neck down. Do not use on open wounds or open sores. Avoid contact with your eyes, ears, mouth, and genitals (private parts). Wash face and genitals (private parts) with your normal soap.  6. Wash thoroughly, paying special attention to the area where your surgery will be performed.  7. Thoroughly rinse your body with warm water.  8. Do not shower/wash with your normal soap after using and rinsing off the CHG soap.  9. Pat yourself dry with a clean towel.  10. Wear clean pajamas to bed the night before surgery.  12. Place clean sheets on your bed the night of your first shower and do not sleep with pets.  13. Shower again with the CHG soap on the day of surgery prior to arriving at the hospital.  14. Do not apply any deodorants/lotions/powders.  15. Please wear clean clothes to the hospital.

## 2024-03-25 NOTE — Addendum Note (Signed)
 Addended by: JORDIS CLICHE F on: 03/25/2024 12:18 PM   Modules accepted: Orders

## 2024-03-25 NOTE — Telephone Encounter (Signed)
 Requested Prescriptions  Pending Prescriptions Disp Refills   rosuvastatin  (CRESTOR ) 20 MG tablet [Pharmacy Med Name: ROSUVASTATIN  CALCIUM  20 MG TAB] 36 tablet 0    Sig: TAKE 1 TABLET BY MOUTH THREE TIMES A WEEK (MON WEDNESDAY AND FRIDAY)     Cardiovascular:  Antilipid - Statins 2 Failed - 03/25/2024  2:32 PM      Failed - Lipid Panel in normal range within the last 12 months    Cholesterol, Total  Date Value Ref Range Status  01/21/2024 149 100 - 199 mg/dL Final   LDL Chol Calc (NIH)  Date Value Ref Range Status  01/21/2024 88 0 - 99 mg/dL Final   HDL  Date Value Ref Range Status  01/21/2024 45 >39 mg/dL Final   Triglycerides  Date Value Ref Range Status  01/21/2024 81 0 - 149 mg/dL Final         Passed - Cr in normal range and within 360 days    Creatinine, Ser  Date Value Ref Range Status  11/28/2023 0.67 0.57 - 1.00 mg/dL Final         Passed - Patient is not pregnant      Passed - Valid encounter within last 12 months    Recent Outpatient Visits           3 months ago Aortic atherosclerosis (HCC)   [facility] [place], Melanie DASEN, NP   3 months ago Black stools   [facility] [place], Melanie DASEN, NP

## 2024-03-26 ENCOUNTER — Encounter: Payer: Self-pay | Admitting: Surgery

## 2024-03-26 ENCOUNTER — Ambulatory Visit: Admitting: Surgery

## 2024-03-26 VITALS — BP 108/70 | HR 71 | Ht 63.0 in | Wt 185.0 lb

## 2024-03-26 DIAGNOSIS — K449 Diaphragmatic hernia without obstruction or gangrene: Secondary | ICD-10-CM | POA: Diagnosis not present

## 2024-03-26 MED ORDER — ONDANSETRON HCL 4 MG PO TABS: 4.0000 mg | ORAL_TABLET | Freq: Three times a day (TID) | ORAL | 1 refills | Status: DC | PRN

## 2024-03-26 NOTE — Progress Notes (Signed)
 Outpatient Surgical Follow Up  03/26/2024  Patricia Ferguson is an 73 y.o. female.   Chief Complaint  Patient presents with   Pre-op Exam    HPI: Patricia Ferguson is a 73 y.o. female seen in F/U for paraesophageal H.  Some atypical chest pain and discomofrt.She endorses some epigastric pain intermittent, dull and mild.  She endorses Reflux at night and worsening when supine. Some dyspnea on exertion . Denies dysphagia. She is Able to perform more than 4 METS w/o chest pain. Works as a Diplomatic Services operational officer at [employer], part time.  She ihas recovered from right foot surgery and is ready to have her surgery next week Prior abdominal operations include appendectomy and hysterectomy CT pers reviewed showing giant Type III paraesophageal H, worsening as compared to 9 years ago. EGD and Colonoscopy 2023 pers reviewed showed kown hiatal hernia, swallow w tpye II paraesophageal > half of the stomach within mediastinum She has lost another 4 lbs since our last visit, I congratulated her on this task.  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 SCREWS IN BACK   bone spur     BREAST BIOPSY Right 1980's   BREAST SURGERY     CERVICAL CONE BIOPSY     COLONOSCOPY N/A 01/02/2024   Procedure: COLONOSCOPY;  Surgeon: Marinda Jayson KIDD, MD;  Location: [facility] ENDOSCOPY;  Service: General;  Laterality: N/A;   COLONOSCOPY WITH PROPOFOL  N/A 06/02/2022   Procedure: COLONOSCOPY WITH PROPOFOL ;  Surgeon: Therisa Bi, MD;  Location: [facility] ENDOSCOPY;  Service: Gastroenterology;  Laterality: N/A;    ESOPHAGOGASTRODUODENOSCOPY N/A 01/02/2024   Procedure: EGD (ESOPHAGOGASTRODUODENOSCOPY);  Surgeon: Marinda Jayson KIDD, MD;  Location: [facility] ENDOSCOPY;  Service: General;  Laterality: N/A;   HAMMER TOE SURGERY Right 02/01/2023   lumbar fission     MASS EXCISION Left 01/07/2020   Procedure: EXCISION MASS (RESECTION OF ANTECUBITAL VENOUS ANEURYSM);  Surgeon: Jama Cordella MATSU, MD;  Location: [facility] ORS;  Service: Vascular;  Laterality: Left;   SPINE SURGERY  2003    Family History  Problem Relation Age of Onset   Diabetes Mother    Hyperlipidemia Mother    Hypertension Mother    Osteoporosis Mother    Arthritis Mother    Cancer Father        lung   Diabetes Father    Diabetes Sister    Hyperlipidemia Sister    Hypertension Sister    Hypertension Brother    Hyperlipidemia Sister    Breast cancer Neg Hx     Social History:  reports that she has never smoked. She has never been exposed to tobacco smoke. She has never used smokeless tobacco. She reports that she does not currently use alcohol. She reports that she does not use drugs.  Allergies:  Allergies  Allergen Reactions   Meloxicam  Other (See Comments)    Face got flushed and red.   Adhesive [Tape] Rash    PAPER TAPE OKAY  Lipitor [Atorvastatin] Other (See Comments)    Muscle aches   Oxycodone Rash   Percodan [Oxycodone-Aspirin] Rash   Pravachol [Pravastatin Sodium] Other (See Comments)    Muscle aches    Propoxyphene Other (See Comments)    depression    Medications reviewed.    ROS Full ROS performed and is otherwise negative other than what is stated in HPI   BP 108/70   Pulse 71   Ht 5' 3 (1.6 m)   Wt 185 lb (83.9 kg)   LMP  (LMP Unknown)   SpO2 98%   BMI 32.77 kg/m   Physical Exam NAD BMI 32 EYES: Pupils are equal, round, and reactive to light, Sclera are non-icteric. EARS, NOSE, MOUTH AND THROAT: The oropharynx is clear. The oral mucosa is pink and moist. Hearing is intact to voice. LYMPH NODES:   Lymph nodes in the neck are normal. RESPIRATORY:  Lungs are clear. There is normal respiratory effort, with equal breath sounds bilaterally, and without pathologic use of accessory muscles. CARDIOVASCULAR: Heart is regular without murmurs, gallops, or rubs. GI: The abdomen is  soft, nontender, and nondistended. There are no palpable masses. There is no hepatosplenomegaly. There are normal bowel sounds in all quadrants. GU: Rectal deferred.   MUSCULOSKELETAL: Normal muscle strength and tone. No cyanosis or edema.   SKIN: Turgor is good and there are no pathologic skin lesions or ulcers. NEUROLOGIC: Motor and sensation is grossly normal. Cranial nerves are grossly intact. PSYCH:  Oriented to person, place and time. Affect is normal.   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 I Discussed with patient in detail about her disease process.  She has completed w/u and I do it is amenable for robotic repair. She has been successful at diet and exercise to optimize her weight. She is here with her husband [name]. D/W her what robotic repair will entail, risks, benefits and possible complications ( including but not limited to bleeding, infection, esophageal or bowel injuries, PTX bloating) . Also d/w her about diet modifications, I walked her through the process and recovery phase. I personally spent a total of 40 minutes in the care of the patient today including performing a medically appropriate exam/evaluation, counseling and educating, placing orders, referring and communicating with other health care professionals, documenting clinical information in the EHR, independently interpreting and reviewing images studies and coordinating care.   Laneta Luna, MD [facility] General Surgeon

## 2024-03-26 NOTE — H&P (View-Only) (Signed)
 Outpatient Surgical Follow Up  03/26/2024  Patricia Ferguson is an 73 y.o. female.   Chief Complaint  Patient presents with   Pre-op Exam    HPI: Patricia Ferguson is a 73 y.o. female seen in F/U for paraesophageal H.  Some atypical chest pain and discomofrt.She endorses some epigastric pain intermittent, dull and mild.  She endorses Reflux at night and worsening when supine. Some dyspnea on exertion . Denies dysphagia. She is Able to perform more than 4 METS w/o chest pain. Works as a Diplomatic Services operational officer at [employer], part time.  She ihas recovered from right foot surgery and is ready to have her surgery next week Prior abdominal operations include appendectomy and hysterectomy CT pers reviewed showing giant Type III paraesophageal H, worsening as compared to 9 years ago. EGD and Colonoscopy 2023 pers reviewed showed kown hiatal hernia, swallow w tpye II paraesophageal > half of the stomach within mediastinum She has lost another 4 lbs since our last visit, I congratulated her on this task.  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 SCREWS IN BACK   bone spur     BREAST BIOPSY Right 1980's   BREAST SURGERY     CERVICAL CONE BIOPSY     COLONOSCOPY N/A 01/02/2024   Procedure: COLONOSCOPY;  Surgeon: Marinda Jayson KIDD, MD;  Location: [facility] ENDOSCOPY;  Service: General;  Laterality: N/A;   COLONOSCOPY WITH PROPOFOL  N/A 06/02/2022   Procedure: COLONOSCOPY WITH PROPOFOL ;  Surgeon: Therisa Bi, MD;  Location: [facility] ENDOSCOPY;  Service: Gastroenterology;  Laterality: N/A;    ESOPHAGOGASTRODUODENOSCOPY N/A 01/02/2024   Procedure: EGD (ESOPHAGOGASTRODUODENOSCOPY);  Surgeon: Marinda Jayson KIDD, MD;  Location: [facility] ENDOSCOPY;  Service: General;  Laterality: N/A;   HAMMER TOE SURGERY Right 02/01/2023   lumbar fission     MASS EXCISION Left 01/07/2020   Procedure: EXCISION MASS (RESECTION OF ANTECUBITAL VENOUS ANEURYSM);  Surgeon: Jama Cordella MATSU, MD;  Location: [facility] ORS;  Service: Vascular;  Laterality: Left;   SPINE SURGERY  2003    Family History  Problem Relation Age of Onset   Diabetes Mother    Hyperlipidemia Mother    Hypertension Mother    Osteoporosis Mother    Arthritis Mother    Cancer Father        lung   Diabetes Father    Diabetes Sister    Hyperlipidemia Sister    Hypertension Sister    Hypertension Brother    Hyperlipidemia Sister    Breast cancer Neg Hx     Social History:  reports that she has never smoked. She has never been exposed to tobacco smoke. She has never used smokeless tobacco. She reports that she does not currently use alcohol. She reports that she does not use drugs.  Allergies:  Allergies  Allergen Reactions   Meloxicam  Other (See Comments)    Face got flushed and red.   Adhesive [Tape] Rash    PAPER TAPE OKAY  Lipitor [Atorvastatin] Other (See Comments)    Muscle aches   Oxycodone Rash   Percodan [Oxycodone-Aspirin] Rash   Pravachol [Pravastatin Sodium] Other (See Comments)    Muscle aches    Propoxyphene Other (See Comments)    depression    Medications reviewed.    ROS Full ROS performed and is otherwise negative other than what is stated in HPI   BP 108/70   Pulse 71   Ht 5' 3 (1.6 m)   Wt 185 lb (83.9 kg)   LMP  (LMP Unknown)   SpO2 98%   BMI 32.77 kg/m   Physical Exam NAD BMI 32 EYES: Pupils are equal, round, and reactive to light, Sclera are non-icteric. EARS, NOSE, MOUTH AND THROAT: The oropharynx is clear. The oral mucosa is pink and moist. Hearing is intact to voice. LYMPH NODES:   Lymph nodes in the neck are normal. RESPIRATORY:  Lungs are clear. There is normal respiratory effort, with equal breath sounds bilaterally, and without pathologic use of accessory muscles. CARDIOVASCULAR: Heart is regular without murmurs, gallops, or rubs. GI: The abdomen is  soft, nontender, and nondistended. There are no palpable masses. There is no hepatosplenomegaly. There are normal bowel sounds in all quadrants. GU: Rectal deferred.   MUSCULOSKELETAL: Normal muscle strength and tone. No cyanosis or edema.   SKIN: Turgor is good and there are no pathologic skin lesions or ulcers. NEUROLOGIC: Motor and sensation is grossly normal. Cranial nerves are grossly intact. PSYCH:  Oriented to person, place and time. Affect is normal.   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 I Discussed with patient in detail about her disease process.  She has completed w/u and I do it is amenable for robotic repair. She has been successful at diet and exercise to optimize her weight. She is here with her husband Patricia Ferguson. D/W her what robotic repair will entail, risks, benefits and possible complications ( including but not limited to bleeding, infection, esophageal or bowel injuries, PTX bloating) . Also d/w her about diet modifications, I walked her through the process and recovery phase. I personally spent a total of 40 minutes in the care of the patient today including performing a medically appropriate exam/evaluation, counseling and educating, placing orders, referring and communicating with other health care professionals, documenting clinical information in the EHR, independently interpreting and reviewing images studies and coordinating care.   Laneta Luna, MD [facility] General Surgeon

## 2024-03-26 NOTE — Patient Instructions (Signed)
 Plan to be in the hospital for 1-2 days if the minimally invasive surgery is completed without having to make a bigger incision. If the bigger incision is made, you will most likely need to be in the hospital 4-6 days. You will be on a soft diet and need to recover for 2 weeks following your surgery prior to doing any of your normal activities. At the 2 week mark, we will see you in the office and if you are doing ok we will advance your diet and activity level as you tolerate.  You will need to arrange to be out of work for approximately 1-2 weeks and then you may return with a lifting restriction for 4 more weeks. If you have FMLA or Disability paperwork that needs to be filled out, please have your company fax your paperwork to [phone] or you may drop this by either office. This paperwork will be filled out within 3 days after your surgery has been completed.  Please call our office with any questions or concerns that you have regarding your surgery and recovery.    Laparoscopic Nissen Fundoplication Laparoscopic Nissen fundoplication is surgery to relieve heartburn and other problems caused by gastric fluids flowing up into your esophagus. The esophagus is the tube that carries food and liquid from your throat to your stomach. Normally, the muscle that sits between your stomach and your esophagus (lower esophageal sphincter or LES) keeps stomach fluids in your stomach. In some people, the LES does not work properly, and stomach fluids flow up into the esophagus. This can happen when part of the stomach bulges through the LES (hiatal hernia). The backward flow of stomach fluids can cause a type of severe and long-standing heartburn that is called gastroesophageal reflux disease (GERD). You may need this surgery if other treatments for GERD have not helped. In a laparoscopic Nissen fundoplication, the upper part of your stomach is wrapped around the lower part of your esophagus to strengthen the  LES and prevent reflux. If you have a hiatal hernia, it will also be repaired with this surgery. The procedure is done through several small incisions in your abdomen. It is performed using a thin, telescopic instrument (laparoscope) and other instruments that can pass through the scope or through other small incisions. Tell a health care provider about: Any allergies you have. All medicines you are taking, including vitamins, herbs, eye drops, creams, and over-the-counter medicines. Any problems you or family members have had with anesthetic medicines. Any blood disorders you have. Any surgeries you have had. Any medical conditions you have. What are the risks? Generally, this is a safe procedure. However, problems may occur, including: Difficulty swallowing (dysphagia). Bloating. Nausea or vomiting. Damage to the lung, causing a collapsed lung. Infection or bleeding. What happens before the procedure? Ask your health care provider about: Changing or stopping your regular medicines. This is especially important if you are taking diabetes medicines or blood thinners. Taking medicines such as aspirin and ibuprofen. These medicines can thin your blood. Do not take these medicines before your procedure if your health care provider asks you not to. Follow your health care provider's instructions about eating or drinking restrictions. Plan to have someone take you home after the procedure. What happens during the procedure? An IV tube will be inserted into one of your veins. It will be used to give you fluids and medicines during the procedure. You will be given a medicine that makes you fall asleep (general anesthetic). Your abdomen  will be cleaned with a germ-killing solution (antiseptic). The surgeon will make a small incision in your abdomen and insert a tube through the incision. Your abdomen will be filled with a gas. This helps the surgeon to see your organs more easily and it makes more  space to work. The surgeon will insert the laparoscope through the incision. The scope has a camera that will send pictures to a monitor in the operating room. The surgeon will make several other small incisions in your abdomen to insert the other instruments that are needed during the procedure. Another instrument (dilator) will be passed through your mouth and down your esophagus into the upper part of your stomach. The dilator will prevent your LES from being closed too tightly during surgery. The surgeon will pass the top portion of your stomach behind the lower part of your esophagus and wrap it all the way around. This will be stitched into place. If you have a hiatal hernia, it will be repaired during this procedure. All instruments will be removed, and your incisions will be closed under your skin with stitches (sutures). Skin adhesive strips may also be used. A bandage (dressing) will be placed on your skin over the incisions. The procedure may vary among health care providers and hospitals. What happens after the procedure? You will be moved to a recovery area. Your blood pressure, heart rate, breathing rate, and blood oxygen level will be monitored often until the medicines you were given have worn off. You will be given pain medicine as needed. Your IV tube will be kept in until you are able to drink fluids. This information is not intended to replace advice given to you by your health care provider. Make sure you discuss any questions you have with your health care provider. Document Released: 07/31/2014 Document Revised: 12/16/2015 Document Reviewed: 03/11/2014 Elsevier Interactive Patient Education  2017 [employer]   Laparoscopic Nissen Fundoplication, Care After Refer to this sheet in the next few weeks. These instructions provide you with information about caring for yourself after your procedure. Your health care provider may also give you more specific instructions. Your  treatment has been planned according to current medical practices, but problems sometimes occur. Call your health care provider if you have any problems or questions after your procedure. What can I expect after the procedure? After the procedure, it is common to have: Difficulty swallowing (dysphagia). Excess gas (bloating). Follow these instructions at home: Medicines  Take medicines only as directed by your health care provider. Do not drive or operate heavy machinery while taking pain medicine. Incision care  There are many different ways to close and cover an incision, including stitches (sutures), skin glue, and adhesive strips. Follow your health care provider's instructions about: Incision care. Bandage (dressing) changes and removal. Incision closure removal. Check your incision areas every day for signs of infection. Watch for: Redness, swelling, or pain. Fluid, blood, or pus. Do not take baths, swim, or use a hot tub until your health care provider approves. Take showers as directed by your health care provider. Eating and drinking  Follow your health care provider's instructions about eating. You may need to follow a very soft diet for 2 weeks, followed by a diet of more regular foods for 2 weeks, no breads. You should return to your usual diet gradually. Drink enough fluid to keep your urine clear or pale yellow. Activity  Return to your normal activities as directed by your health care provider. Ask your  health care provider what activities are safe for you. Avoid strenuous exercise. Do not lift anything that is heavier than 10 lb (4.5 kg). Ask your health care provider when you can: Return to sexual activity. Drive. Go back to work. Contact a health care provider if: You have a fever. Your pain gets worse or is not helped by medicine. You have frequent nausea or vomiting. You have continued abdominal bloating. You have an ongoing (persistent) cough. You have  redness, swelling, or pain in any incision areas. You have fluid, blood, or pus coming from any incisions. Get help right away if: You have trouble breathing. You are unable to swallow. You have persistent vomiting. You have blood in your vomit. You have severe abdominal pain. This information is not intended to replace advice given to you by your health care provider. Make sure you discuss any questions you have with your health care provider. Document Released: 03/02/2004 Document Revised: 12/16/2015 Document Reviewed: 03/11/2014 Elsevier Interactive Patient Education  2017 [employer] Inc.   Diet After Nissen Fundoplication Surgery This diet information is for patients who have recently had Nissen fundoplication surgery to correct reflux disease or to repair various types of hernias, such as hiatal hernia and intrathoracic stomach. This diet may also be used for other gastrointestinal surgeries, such as Heller myotomy and repair of achalasia. The diet will help control diarrhea, excess gas and swallowing problems, which may occur after this type of surgery. Keeping Your Stomach from Stretching Eat small, frequent meals (six to eight per day). This will help you consume the majority of the nutrients you need without causing your stomach to feel full or distended.  Drinking large amounts of fluids with meals can stretch your stomach. You may drink fluids between meals as often as you like, but limit fluids to 1/2 cup (4 fluid ounces) with meals and one cup (8 fluid ounces) with snacks.  Sit upright while eating and stay upright for 30 minutes after each meal. Gravity can help food move through your digestive tract. Do not lie down after eating. Sit upright for 2 hours after your last meal or snack of the day.  Eat very slowly. Take your time when eating.  Take small bites and chew your food well to help aid in swallowing and digestion.  Avoid crusty breads and sticky, gummy foods, such as  bananas, fresh doughy breads, rolls and doughnuts. These types of foods become sticky and difficult to swallow.  Toasted breads tend to be better tolerated.  Lastly, if you eat sweets, consume them at the end of your meal to avoid a group of symptoms referred to as "dumping syndrome". This describes the rapid emptying of foods from the stomach to the small intestine. Sweetened beverages, candy and desserts move more rapidly and dump quickly into the intestines. This can cause symptoms of nausea, weakness, cold sweats, cramps, diarrhea and dizzy spells.  Avoiding Gas Avoid drinking through a straw. Do not chew gum or tobacco. These actions cause you to swallow air, which produces excess gas in your stomach. Chew with your mouth closed.  Avoid any foods that cause stomach gas and distention. These foods include corn, dried beans, peas, lentils, onions, broccoli, cauliflower and any food from the cabbage family.  Avoid carbonated drinks, alcohol, citrus and tomato products.  When will I be able to eat a soft diet? After Nissen fundoplication surgery, your diet will be advanced slowly by your surgeon. Generally, you will be on a clear liquid diet  for the first few meals. Then you will advance to the full liquid diet for a meal or two and eventually to a Nissen soft diet. Please be aware that each patient's tolerance to food is different. Your doctor will advance your diet depending on how well you progress after surgery. Clear Liquid Diet  The first diet after surgery is the clear liquid diet. It includes the following liquids: Apple juice  Cranberry juice  Grape juice  Chicken broth  Beef broth  Flavored gelatin (Jell-O)  Decaf tea and coffee  Caffeinated beverages are permitted based on tolerance  Popsicles  [place] ice Carbonated drinks (sodas) are not allowed for the first six to eight weeks after surgery. After this time you can try them again in small amounts.  Full Liquid Diet The full  liquid diet contains anything on the clear liquid diet, plus: Milk, soy, rice and almond (no chocolate)  Cream of wheat, cream of rice, grits  Strained creamed soups (no tomato or broccoli)  Vanilla and strawberry-flavored ice cream  Sherbet  Blended, custard styled or whipped yogurt (plain or vanilla only)  Vanilla and butterscotch pudding (no chocolate or coconut)  Nutritional drinks including Ensure, Boost, Carnation Instant Breakfast (no chocolate-flavored) Note: Dairy products, such as milk, ice cream and pudding, may cause diarrhea in some people just after surgery. You may need to avoid milk products. If so, substitute them with lactose-free beverages, such as soy, rice, Lactaid or almond milks.  Nissen Soft Diet Food Category Foods to Choose Foods to Avoid  Beverages Milk, such as, whole, 2%, 1%, non-fat, or skim, soy, rice, almond  Caffeinated and decaf tea and coffee  Powdered drink mixes (in moderation)  Non-citrus juices (apple, grape, cranberry or blends of these)  Fruit nectars  Nutritional drinks including Boost, Ensure, Carnation Instant Breakfast Chocolate milk, cocoa or other chocolate-flavored drinks  Carbonated drinks  Alcohol  Citrus juices like orange, grapefruit, lemon and lime  Breads Crackers (saltine, butter, soda, graham, Goldfish and Cheese Nips)   Untoasted bread, bagels, [place] and hard rolls, English muffins  Crackers with nuts, seeds, fresh or dried fruit, coconut, or highly seasoned, such as garlic or onion-flavored  Sweet rolls, coffee cake or doughnuts  Cereals Well cooked cereals, such as oatmeal (plain or flavored)  Cold cereal (Cornflakes, Rice Krispies, Cheerios, Special K plain, Rice Chex and puffed rice) Very coarse cereal, such as bran, shredded wheat  Any cereal with fresh or dried fruit, coconut, seeds or nuts  Desserts Eat in moderation and do not eat desserts or sweets by themselves. Plain cakes, cookies and cream-filled pies   Vanilla and butterscotch pudding or custard  Ice cream, ice milk, frozen yogurt and sherbet  Gelatin made from allowed foods  Fruit ices and popsicles Desserts containing chocolate, coconut, nuts, seeds, fresh or dried fruit, peppermint or spearmint  Eggs  Poached, hard boiled or scrambled Fried eggs and highly seasoned eggs (deviled eggs)  Fats Eat in moderation. Butter and margarine  Mayonnaise and vegetable oils  Mildly seasoned cream sauces and gravies  Plain cream cheese  Sour cream Highly seasoned salad dressings, cream sauces and gravies  Bacon, bacon fat, ham fat, lard and salt pork  Fried foods  Nuts  Fruits Fruit juice  Any canned or cooked fruit except those listed in the AVOID column ALL fresh fruits, such as citrus, apples, and pineapple  Canned pineapple  Dried fruits, such as raisins, berries  Fruits with seeds, such as berries, kiwi and figs  Meat, Fish, Poultry, and [employer] may be ground, minced or chopped to ease swallowing and digestion  Tender, well cooked and moist cuts of beef, chicken, [place] and pork  Veal and lamb  Flaky, cooked fish  Canned tuna  Cottage and ricotta cheeses  Mild cheese, such as American, brick, mozzarella and baby Swiss  Creamy peanut butter  Plain custard or blended fruit yogurt  Moist casseroles, such as macaroni & cheese, tuna noodle  Grilled or toasted cheese sandwich Tough meats with a lot of gristle  Fried, highly seasoned, smoked and fatty meat, fish or poultry, such as frankfurters, luncheon meats, sausage, bacon, spare ribs, beef brisket, sardines, anchovies, duck and goose  Chili and other entrees made with pepper or chili pepper  Shellfish  Strongly flavored cheeses, such as sharp cheese, extra sharp cheddar, cheese containing peppers or other seasonings  Crunchy peanut butter  Any yogurt with nuts, seeds, coconut, strawberries or raspberries  Potatoes and Starches Peeled, mashed or boiled white or sweet  potatoes  Oven-baked potatoes without skin  Well cooked white rice, enriched noodles, barley, spaghetti, macaroni and other pastas Fried potatoes, potato skins and potato chips  Hard and soft taco shells  Fried, brown or wild rice  Soups Mildly flavored meat stocks  Cream soups made from allowed foods Highly seasoned soups and tomato based soups, cream soups made with gas producing vegetables, such as broccoli, cauliflower, onion, etc.  Sweets and Snacks Use in moderation and do not eat large amounts of sweets by themselves. Syrup, honey, jelly and seedless jam  Plain hard candies and plain candies made with allowed ingredients  Molasses  Marshmallows  Other candy made from allowed ingredients  Thin pretzels Jam, marmalade and preserves  Chocolate in any form  Any candy containing nuts, coconut, seeds, peppermint, spearmint or dried or fresh fruit  Popcorn, potato chips, tortilla chips  Soft or hard thick pretzels, such as sourdough  Vegetables Well cooked soft vegetables without seeds or skins, such as asparagus tips, beets, carrots, green and wax beans, chopped spinach, tender canned baby peas, squash and pumpkin Raw vegetables, tomatoes, tomato juice, tomato sauce and V-8 juice(tomato based products can irritate the stomach)  Gas producing vegetables, such as broccoli, Brussel sprouts, cabbage, cauliflower, onions, corn, cucumber, green peppers, rutabagas, turnips, radishes and sauerkraut  Dried beans, peas and lentils  Miscellaneous Salt and spices in moderation  Mustard and vinegar in moderation Fried or highly seasoned foods  Coconut and seeds  Pickles and olives  Chili sauces, ketchup, barbecue sauce, horseradish, black pepper, chili powder and onion and garlic seasonings  Any other strongly flavored seasoning, condiment, spice or herb not tolerated  Any food not tolerated

## 2024-03-27 ENCOUNTER — Encounter
Admission: RE | Admit: 2024-03-27 | Discharge: 2024-03-27 | Disposition: A | Discharge status: Home / Self Care | Source: Ambulatory Visit | Attending: Surgery | Admitting: Surgery

## 2024-03-27 DIAGNOSIS — K449 Diaphragmatic hernia without obstruction or gangrene: Secondary | ICD-10-CM | POA: Diagnosis not present

## 2024-03-27 DIAGNOSIS — Z01818 Encounter for other preprocedural examination: Secondary | ICD-10-CM | POA: Insufficient documentation

## 2024-03-27 DIAGNOSIS — Z01810 Encounter for preprocedural cardiovascular examination: Secondary | ICD-10-CM | POA: Diagnosis not present

## 2024-03-27 DIAGNOSIS — Z01812 Encounter for preprocedural laboratory examination: Secondary | ICD-10-CM | POA: Diagnosis present

## 2024-03-27 LAB — TYPE AND SCREEN
ABO/RH(D): A POS
Antibody Screen: NEGATIVE

## 2024-04-01 ENCOUNTER — Observation Stay

## 2024-04-01 ENCOUNTER — Other Ambulatory Visit: Payer: Self-pay

## 2024-04-01 ENCOUNTER — Ambulatory Visit

## 2024-04-01 ENCOUNTER — Ambulatory Visit: Payer: Self-pay | Admitting: Urgent Care

## 2024-04-01 ENCOUNTER — Observation Stay
Admission: RE | Admit: 2024-04-01 | Discharge: 2024-04-03 | Disposition: A | Discharge status: Home / Self Care | Attending: Surgery | Admitting: Surgery

## 2024-04-01 ENCOUNTER — Encounter: Payer: Self-pay | Admitting: Surgery

## 2024-04-01 ENCOUNTER — Encounter
Admission: RE | Disposition: A | Discharge status: Home / Self Care | Payer: Self-pay | Source: Home / Self Care | Attending: Surgery

## 2024-04-01 DIAGNOSIS — Z9889 Other specified postprocedural states: Secondary | ICD-10-CM | POA: Insufficient documentation

## 2024-04-01 DIAGNOSIS — K224 Dyskinesia of esophagus: Secondary | ICD-10-CM | POA: Diagnosis not present

## 2024-04-01 DIAGNOSIS — K449 Diaphragmatic hernia without obstruction or gangrene: Principal | ICD-10-CM

## 2024-04-01 DIAGNOSIS — Z8719 Personal history of other diseases of the digestive system: Principal | ICD-10-CM

## 2024-04-01 HISTORY — PX: XI ROBOTIC ASSISTED PARAESOPHAGEAL HERNIA REPAIR: SHX6871

## 2024-04-01 HISTORY — PX: INSERTION OF MESH: SHX5868

## 2024-04-01 SURGERY — REPAIR, HERNIA, PARAESOPHAGEAL, ROBOT-ASSISTED
Anesthesia: General

## 2024-04-01 MED ORDER — DIPHENHYDRAMINE HCL 50 MG/ML IJ SOLN: 12.5000 mg | Freq: Four times a day (QID) | INTRAMUSCULAR | Status: DC | PRN

## 2024-04-01 MED ORDER — VISTASEAL 10 ML SINGLE DOSE KIT
PACK | CUTANEOUS | Status: DC | PRN
  Administered 2024-04-01: 10 mL via TOPICAL

## 2024-04-01 MED ORDER — ONDANSETRON HCL 4 MG/2ML IJ SOLN
INTRAMUSCULAR | Status: AC
Start: 2024-04-01 — End: 2024-04-01
  Filled 2024-04-01: qty 2

## 2024-04-01 MED ORDER — CEFAZOLIN SODIUM-DEXTROSE 2-4 GM/100ML-% IV SOLN
INTRAVENOUS | Status: AC
  Filled 2024-04-01: qty 100

## 2024-04-01 MED ORDER — GLYCOPYRROLATE 0.2 MG/ML IJ SOLN
INTRAMUSCULAR | Status: AC
Start: 2024-04-01 — End: 2024-04-01
  Filled 2024-04-01: qty 1

## 2024-04-01 MED ORDER — PROCHLORPERAZINE EDISYLATE 10 MG/2ML IJ SOLN: 5.0000 mg | Freq: Four times a day (QID) | INTRAMUSCULAR | Status: DC | PRN

## 2024-04-01 MED ORDER — DIPHENHYDRAMINE HCL 12.5 MG/5ML PO ELIX: 12.5000 mg | ORAL_SOLUTION | Freq: Four times a day (QID) | ORAL | Status: DC | PRN

## 2024-04-01 MED ORDER — FENTANYL CITRATE (PF) 100 MCG/2ML IJ SOLN
INTRAMUSCULAR | Status: DC | PRN
  Administered 2024-04-01 (×4): 50 ug via INTRAVENOUS

## 2024-04-01 MED ORDER — DEXAMETHASONE SODIUM PHOSPHATE 10 MG/ML IJ SOLN
INTRAMUSCULAR | Status: DC | PRN
  Administered 2024-04-01: 10 mg via INTRAVENOUS

## 2024-04-01 MED ORDER — GABAPENTIN 300 MG PO CAPS
300.0000 mg | ORAL_CAPSULE | ORAL | Status: AC
  Administered 2024-04-01: 300 mg via ORAL

## 2024-04-01 MED ORDER — DEXAMETHASONE SODIUM PHOSPHATE 10 MG/ML IJ SOLN
INTRAMUSCULAR | Status: AC
  Filled 2024-04-01: qty 1

## 2024-04-01 MED ORDER — ONDANSETRON HCL 4 MG/2ML IJ SOLN
4.0000 mg | Freq: Four times a day (QID) | INTRAMUSCULAR | Status: DC | PRN
  Administered 2024-04-01: 4 mg via INTRAVENOUS
  Filled 2024-04-01: qty 2

## 2024-04-01 MED ORDER — KETOROLAC TROMETHAMINE 30 MG/ML IJ SOLN
INTRAMUSCULAR | Status: AC
  Filled 2024-04-01: qty 1

## 2024-04-01 MED ORDER — ONDANSETRON HCL 4 MG/2ML IJ SOLN
INTRAMUSCULAR | Status: DC | PRN
Start: 2024-04-01 — End: 2024-04-01
  Administered 2024-04-01: 4 mg via INTRAVENOUS

## 2024-04-01 MED ORDER — ENOXAPARIN SODIUM 40 MG/0.4ML IJ SOSY
40.0000 mg | PREFILLED_SYRINGE | INTRAMUSCULAR | Status: DC
  Administered 2024-04-02 – 2024-04-03 (×2): 40 mg via SUBCUTANEOUS
  Filled 2024-04-01 (×2): qty 0.4

## 2024-04-01 MED ORDER — FENTANYL CITRATE (PF) 100 MCG/2ML IJ SOLN
INTRAMUSCULAR | Status: AC
  Filled 2024-04-01: qty 2

## 2024-04-01 MED ORDER — BUPIVACAINE-EPINEPHRINE (PF) 0.25% -1:200000 IJ SOLN
INTRAMUSCULAR | Status: AC
  Filled 2024-04-01: qty 30

## 2024-04-01 MED ORDER — PROPOFOL 1000 MG/100ML IV EMUL
INTRAVENOUS | Status: AC
  Filled 2024-04-01: qty 100

## 2024-04-01 MED ORDER — PROPOFOL 1000 MG/100ML IV EMUL
INTRAVENOUS | Status: AC
Start: 2024-04-01 — End: 2024-04-01
  Filled 2024-04-01: qty 100

## 2024-04-01 MED ORDER — ACETAMINOPHEN 500 MG PO TABS
ORAL_TABLET | ORAL | Status: AC
  Filled 2024-04-01: qty 2

## 2024-04-01 MED ORDER — ONDANSETRON HCL 4 MG/2ML IJ SOLN
INTRAMUSCULAR | Status: AC
  Filled 2024-04-01: qty 2

## 2024-04-01 MED ORDER — CHLORHEXIDINE GLUCONATE 0.12 % MT SOLN
OROMUCOSAL | Status: AC
  Filled 2024-04-01: qty 15

## 2024-04-01 MED ORDER — FENTANYL CITRATE (PF) 100 MCG/2ML IJ SOLN
25.0000 ug | INTRAMUSCULAR | Status: DC | PRN
  Administered 2024-04-01 (×3): 50 ug via INTRAVENOUS

## 2024-04-01 MED ORDER — PROPOFOL 10 MG/ML IV BOLUS
INTRAVENOUS | Status: DC | PRN
  Administered 2024-04-01: 100 ug/kg/min via INTRAVENOUS
  Administered 2024-04-01: 40 ug via INTRAVENOUS
  Administered 2024-04-01: 120 ug/kg/min via INTRAVENOUS
  Administered 2024-04-01: 100 mg via INTRAVENOUS

## 2024-04-01 MED ORDER — LACTATED RINGERS IV SOLN: INTRAVENOUS | Status: DC

## 2024-04-01 MED ORDER — MELATONIN 5 MG PO TABS: 5.0000 mg | ORAL_TABLET | Freq: Every evening | ORAL | Status: DC | PRN

## 2024-04-01 MED ORDER — CHLORHEXIDINE GLUCONATE CLOTH 2 % EX PADS
6.0000 | MEDICATED_PAD | Freq: Once | CUTANEOUS | Status: AC
  Administered 2024-04-01: 6 via TOPICAL

## 2024-04-01 MED ORDER — SPY AGENT GREEN - (INDOCYANINE FOR INJECTION)
INTRAMUSCULAR | Status: DC | PRN
  Administered 2024-04-01: 25 mg

## 2024-04-01 MED ORDER — CEFAZOLIN SODIUM-DEXTROSE 2-4 GM/100ML-% IV SOLN
2.0000 g | INTRAVENOUS | Status: AC
  Administered 2024-04-01: 2 g via INTRAVENOUS

## 2024-04-01 MED ORDER — MIDAZOLAM HCL 2 MG/2ML IJ SOLN
INTRAMUSCULAR | Status: AC
  Filled 2024-04-01: qty 2

## 2024-04-01 MED ORDER — ORAL CARE MOUTH RINSE: 15.0000 mL | Freq: Once | OROMUCOSAL | Status: AC

## 2024-04-01 MED ORDER — BUPIVACAINE-EPINEPHRINE 0.25% -1:200000 IJ SOLN
INTRAMUSCULAR | Status: DC | PRN
  Administered 2024-04-01: 30 mL

## 2024-04-01 MED ORDER — VISTASEAL 10 ML SINGLE DOSE KIT
PACK | CUTANEOUS | Status: AC
  Filled 2024-04-01: qty 10

## 2024-04-01 MED ORDER — PROPOFOL 10 MG/ML IV BOLUS
INTRAVENOUS | Status: AC
  Filled 2024-04-01: qty 20

## 2024-04-01 MED ORDER — PHENYLEPHRINE HCL-NACL 20-0.9 MG/250ML-% IV SOLN
INTRAVENOUS | Status: AC
  Filled 2024-04-01: qty 250

## 2024-04-01 MED ORDER — CHLORHEXIDINE GLUCONATE 0.12 % MT SOLN
15.0000 mL | Freq: Once | OROMUCOSAL | Status: AC
  Administered 2024-04-01: 15 mL via OROMUCOSAL

## 2024-04-01 MED ORDER — SODIUM CHLORIDE 0.9 % IV SOLN: INTRAVENOUS | Status: AC

## 2024-04-01 MED ORDER — PHENYLEPHRINE 80 MCG/ML (10ML) SYRINGE FOR IV PUSH (FOR BLOOD PRESSURE SUPPORT)
PREFILLED_SYRINGE | INTRAVENOUS | Status: DC | PRN
  Administered 2024-04-01 (×7): 80 ug via INTRAVENOUS
  Administered 2024-04-01: 160 ug via INTRAVENOUS

## 2024-04-01 MED ORDER — MIDAZOLAM HCL 2 MG/2ML IJ SOLN
INTRAMUSCULAR | Status: DC | PRN
Start: 2024-04-01 — End: 2024-04-01
  Administered 2024-04-01: 2 mg via INTRAVENOUS

## 2024-04-01 MED ORDER — GABAPENTIN 300 MG PO CAPS
ORAL_CAPSULE | ORAL | Status: AC
  Filled 2024-04-01: qty 1

## 2024-04-01 MED ORDER — PHENYLEPHRINE 80 MCG/ML (10ML) SYRINGE FOR IV PUSH (FOR BLOOD PRESSURE SUPPORT)
PREFILLED_SYRINGE | INTRAVENOUS | Status: AC
Start: 2024-04-01 — End: 2024-04-01
  Filled 2024-04-01: qty 10

## 2024-04-01 MED ORDER — HYDROMORPHONE HCL 1 MG/ML IJ SOLN: 0.5000 mg | INTRAMUSCULAR | Status: DC | PRN

## 2024-04-01 MED ORDER — HYDROCODONE-ACETAMINOPHEN 5-325 MG PO TABS: 1.0000 | ORAL_TABLET | ORAL | Status: DC | PRN

## 2024-04-01 MED ORDER — ROCURONIUM BROMIDE 100 MG/10ML IV SOLN
INTRAVENOUS | Status: DC | PRN
  Administered 2024-04-01 (×2): 50 mg via INTRAVENOUS
  Administered 2024-04-01: 30 mg via INTRAVENOUS

## 2024-04-01 MED ORDER — LIDOCAINE HCL (CARDIAC) PF 100 MG/5ML IV SOSY
PREFILLED_SYRINGE | INTRAVENOUS | Status: DC | PRN
Start: 2024-04-01 — End: 2024-04-01
  Administered 2024-04-01: 100 mg via INTRAVENOUS

## 2024-04-01 MED ORDER — CEFAZOLIN SODIUM-DEXTROSE 2-4 GM/100ML-% IV SOLN
2.0000 g | Freq: Three times a day (TID) | INTRAVENOUS | Status: AC
  Administered 2024-04-01 – 2024-04-02 (×3): 2 g via INTRAVENOUS
  Filled 2024-04-01 (×3): qty 100

## 2024-04-01 MED ORDER — LIDOCAINE HCL (PF) 2 % IJ SOLN
INTRAMUSCULAR | Status: AC
  Filled 2024-04-01: qty 5

## 2024-04-01 MED ORDER — ONDANSETRON 4 MG PO TBDP: 4.0000 mg | ORAL_TABLET | Freq: Four times a day (QID) | ORAL | Status: DC | PRN

## 2024-04-01 MED ORDER — PHENYLEPHRINE HCL-NACL 20-0.9 MG/250ML-% IV SOLN
INTRAVENOUS | Status: DC | PRN
  Administered 2024-04-01: 40 ug/min via INTRAVENOUS

## 2024-04-01 MED ORDER — LACTATED RINGERS IV SOLN: INTRAVENOUS | Status: DC | PRN

## 2024-04-01 MED ORDER — ROCURONIUM BROMIDE 10 MG/ML (PF) SYRINGE
PREFILLED_SYRINGE | INTRAVENOUS | Status: AC
  Filled 2024-04-01: qty 10

## 2024-04-01 MED ORDER — KETOROLAC TROMETHAMINE 30 MG/ML IJ SOLN
INTRAMUSCULAR | Status: DC | PRN
  Administered 2024-04-01: 15 mg via INTRAVENOUS

## 2024-04-01 MED ORDER — HYDRALAZINE HCL 20 MG/ML IJ SOLN: 10.0000 mg | INTRAMUSCULAR | Status: DC | PRN

## 2024-04-01 MED ORDER — ACETAMINOPHEN 500 MG PO TABS
1000.0000 mg | ORAL_TABLET | ORAL | Status: AC
  Administered 2024-04-01: 1000 mg via ORAL

## 2024-04-01 MED ORDER — PROCHLORPERAZINE MALEATE 10 MG PO TABS: 10.0000 mg | ORAL_TABLET | Freq: Four times a day (QID) | ORAL | Status: DC | PRN

## 2024-04-01 MED ORDER — SUGAMMADEX SODIUM 200 MG/2ML IV SOLN
INTRAVENOUS | Status: DC | PRN
  Administered 2024-04-01: 200 mg via INTRAVENOUS

## 2024-04-01 MED ORDER — EPHEDRINE SULFATE-NACL 50-0.9 MG/10ML-% IV SOSY
PREFILLED_SYRINGE | INTRAVENOUS | Status: DC | PRN
  Administered 2024-04-01 (×3): 5 mg via INTRAVENOUS

## 2024-04-01 MED ORDER — KETOROLAC TROMETHAMINE 15 MG/ML IJ SOLN
15.0000 mg | Freq: Four times a day (QID) | INTRAMUSCULAR | Status: DC
  Administered 2024-04-01 – 2024-04-02 (×4): 15 mg via INTRAVENOUS
  Filled 2024-04-01 (×5): qty 1

## 2024-04-01 SURGICAL SUPPLY — 44 items
APPLICATOR VISTASEAL 35 (MISCELLANEOUS) IMPLANT
CANNULA REDUCER 12-8 DVNC XI (CANNULA) ×2 IMPLANT
CLIP LIGATING HEM O LOK PURPLE (MISCELLANEOUS) IMPLANT
DEFOGGER SCOPE WARM SEASHARP (MISCELLANEOUS) ×2 IMPLANT
DERMABOND ADVANCED .7 DNX12 (GAUZE/BANDAGES/DRESSINGS) ×2 IMPLANT
DRAPE ARM DVNC X/XI (DISPOSABLE) ×8 IMPLANT
DRAPE COLUMN DVNC XI (DISPOSABLE) ×2 IMPLANT
ELECTRODE REM PT RTRN 9FT ADLT (ELECTROSURGICAL) ×2 IMPLANT
FORCEPS BPLR R/ABLATION 8 DVNC (INSTRUMENTS) ×2 IMPLANT
GLOVE BIO SURGEON STRL SZ7 (GLOVE) ×6 IMPLANT
GOWN STRL REUS W/ TWL LRG LVL3 (GOWN DISPOSABLE) ×8 IMPLANT
GRASPER LAPSCPC 5X45 DSP (INSTRUMENTS) ×2 IMPLANT
GRASPER TIP-UP FEN DVNC XI (INSTRUMENTS) ×2 IMPLANT
IRRIGATION STRYKERFLOW (MISCELLANEOUS) IMPLANT
IV NS 1000ML BAXH (IV SOLUTION) IMPLANT
KIT IMAGING PINPOINTPAQ (MISCELLANEOUS) ×2 IMPLANT
KIT PINK PAD W/HEAD ARM REST (MISCELLANEOUS) ×2 IMPLANT
LABEL OR SOLS (LABEL) ×2 IMPLANT
MANIFOLD NEPTUNE II (INSTRUMENTS) ×2 IMPLANT
MESH BIO-A 7X10 SYN MAT (Mesh General) IMPLANT
NDL DRIVE SUT CUT DVNC (INSTRUMENTS) ×2 IMPLANT
NDL HYPO 22X1.5 SAFETY MO (MISCELLANEOUS) ×2 IMPLANT
NEEDLE DRIVE SUT CUT DVNC (INSTRUMENTS) ×2 IMPLANT
NEEDLE HYPO 22X1.5 SAFETY MO (MISCELLANEOUS) ×2 IMPLANT
OBTURATOR OPTICALSTD 8 DVNC (TROCAR) ×2 IMPLANT
PACK LAP CHOLECYSTECTOMY (MISCELLANEOUS) ×2 IMPLANT
SEAL UNIV 5-12 XI (MISCELLANEOUS) ×8 IMPLANT
SEALER VESSEL EXT DVNC XI (MISCELLANEOUS) ×2 IMPLANT
SOLUTION ELECTROSURG ANTI STCK (MISCELLANEOUS) ×2 IMPLANT
SPIKE FLUID TRANSFER (MISCELLANEOUS) ×2 IMPLANT
SPONGE T-LAP 18X18 ~~LOC~~+RFID (SPONGE) ×2 IMPLANT
SUT SILK 2 0 SH (SUTURE) ×4 IMPLANT
SUT STRATA 2-0 23CM CT-2 (SUTURE) ×2 IMPLANT
SUT VIC AB 3-0 SH 27X BRD (SUTURE) IMPLANT
SUT VICRYL 0 UR6 27IN ABS (SUTURE) ×4 IMPLANT
SUTURE MNCRL 4-0 27XMF (SUTURE) ×2 IMPLANT
SYR 30ML LL (SYRINGE) ×2 IMPLANT
SYRINGE TOOMEY IRRIG 70ML (MISCELLANEOUS) ×2 IMPLANT
SYSTEM BAG RETRIEVAL 10MM (BASKET) IMPLANT
TRAP FLUID SMOKE EVACUATOR (MISCELLANEOUS) ×2 IMPLANT
TRAY FOLEY SLVR 16FR LF STAT (SET/KITS/TRAYS/PACK) ×2 IMPLANT
TROCAR Z-THREAD FIOS 5X100MM (TROCAR) ×2 IMPLANT
TUBING EVAC SMOKE HEATED PNEUM (TUBING) ×2 IMPLANT
WATER STERILE IRR 500ML POUR (IV SOLUTION) ×2 IMPLANT

## 2024-04-01 NOTE — Anesthesia Postprocedure Evaluation (Signed)
 Anesthesia Post Note  Patient: Patricia Ferguson  Procedure(s) Performed: REPAIR, HERNIA, PARAESOPHAGEAL, ROBOT-ASSISTED INSERTION OF MESH  Patient location during evaluation: PACU Anesthesia Type: General Level of consciousness: awake and alert Pain management: pain level controlled Vital Signs Assessment: post-procedure vital signs reviewed and stable Respiratory status: spontaneous breathing, nonlabored ventilation, respiratory function stable and patient connected to nasal cannula oxygen Cardiovascular status: blood pressure returned to baseline and stable Postop Assessment: no apparent nausea or vomiting Anesthetic complications: no   There were no known notable events for this encounter.   Last Vitals:  Vitals:   04/01/24 1245 04/01/24 1300  BP: 121/66 122/68  Pulse: 64 62  Resp: 13 16  Temp:  (!) 36.3 C  SpO2: 100% 100%    Last Pain:  Vitals:   04/01/24 1300  TempSrc: Temporal  PainSc:                  Patricia Ferguson

## 2024-04-01 NOTE — Transfer of Care (Signed)
 Immediate Anesthesia Transfer of Care Note  Patient: Patricia Ferguson  Procedure(s) Performed: REPAIR, HERNIA, PARAESOPHAGEAL, ROBOT-ASSISTED INSERTION OF MESH  Patient Location: PACU  Anesthesia Type:General  Level of Consciousness: drowsy and responds to stimulation  Airway & Oxygen Therapy: Patient Spontanous Breathing  Post-op Assessment: Report given to RN and Post -op Vital signs reviewed and stable  Post vital signs: Reviewed and stable  Last Vitals:  Vitals Value Taken Time  BP 116/60 04/01/24 10:50  Temp    Pulse 81 04/01/24 10:53  Resp 24 04/01/24 10:53  SpO2 96 % 04/01/24 10:53  Vitals shown include unfiled device data.  Last Pain:  Vitals:   04/01/24 1050  PainSc: 4          Complications: There were no known notable events for this encounter.

## 2024-04-01 NOTE — Anesthesia Procedure Notes (Signed)
 Procedure Name: Intubation Date/Time: 04/01/2024 7:43 AM  Performed by: Bonnetta Jimmey SAUNDERS, CRNAPre-anesthesia Checklist: Patient identified, Emergency Drugs available, Suction available, Patient being monitored and Timeout performed Patient Re-evaluated:Patient Re-evaluated prior to induction Oxygen Delivery Method: Circle system utilized Preoxygenation: Pre-oxygenation with 100% oxygen Induction Type: IV induction Ventilation: Mask ventilation without difficulty LMA: LMA inserted LMA Size: 3.0 Laryngoscope Size: McGrath and 3 Grade View: Grade I Tube type: Oral Tube size: 6.5 mm Number of attempts: 1 Airway Equipment and Method: Stylet Placement Confirmation: positive ETCO2, breath sounds checked- equal and bilateral and ETT inserted through vocal cords under direct vision Secured at: 20 cm Tube secured with: Tape Dental Injury: Teeth and Oropharynx as per pre-operative assessment

## 2024-04-01 NOTE — Anesthesia Preprocedure Evaluation (Signed)
 Anesthesia Evaluation  Patient identified by MRN, date of birth, ID band Patient awake    Reviewed: Allergy & Precautions, NPO status , Patient's Chart, lab work & pertinent test results  History of Anesthesia Complications Negative for: history of anesthetic complications  Airway Mallampati: I  TM Distance: >3 FB Neck ROM: Full    Dental  (+) Missing   Pulmonary neg pulmonary ROS   Pulmonary exam normal breath sounds clear to auscultation       Cardiovascular Exercise Tolerance: Good negative cardio ROS Normal cardiovascular exam Rhythm:Regular Rate:Normal  ECG 05/11/23: Sinus bradycardia   Neuro/Psych negative neurological ROS  negative psych ROS   GI/Hepatic negative GI ROS, Neg liver ROS, hiatal hernia,GERD  Medicated,,  Endo/Other  negative endocrine ROS  Obesity   Renal/GU      Musculoskeletal   Abdominal   Peds  Hematology negative hematology ROS (+)   Anesthesia Other Findings Past Medical History: No date: Allergy No date: Arthritis     Comment:  right knee No date: FH: colonic polyps No date: GERD (gastroesophageal reflux disease) No date: Hypercholesterolemia No date: Internal hemorrhoids No date: Obesity No date: Osteopenia No date: Pneumonia 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 SCREWS IN BACK No date: bone spur 1980's: BREAST BIOPSY; Right No date: BREAST SURGERY No date: CERVICAL CONE BIOPSY 01/02/2024: COLONOSCOPY; N/A     Comment:  Procedure: COLONOSCOPY;  Surgeon: Marinda Jayson KIDD, MD;               Location: [facility] ENDOSCOPY;  Service: General;  Laterality:              N/A; 06/02/2022: COLONOSCOPY WITH PROPOFOL ; N/A     Comment:  Procedure: COLONOSCOPY WITH PROPOFOL ;  Surgeon:  Therisa Bi, MD;  Location: [facility] ENDOSCOPY;  Service:               Gastroenterology;  Laterality: N/A; 01/02/2024: ESOPHAGOGASTRODUODENOSCOPY; N/A     Comment:  Procedure: EGD (ESOPHAGOGASTRODUODENOSCOPY);  Surgeon:               Marinda Jayson KIDD, MD;  Location: [facility] ENDOSCOPY;                Service: General;  Laterality: N/A; 02/01/2023: HAMMER TOE SURGERY; Right 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: lumbar fission 01/07/2020: MASS EXCISION; Left     Comment:  Procedure: EXCISION MASS (RESECTION OF ANTECUBITAL               VENOUS ANEURYSM);  Surgeon: Jama Cordella MATSU, MD;                Location: [facility] ORS;  Service: Vascular;  Laterality:               Left; 2003: SPINE SURGERY  BMI    Body Mass Index: 32.77 kg/m      Reproductive/Obstetrics negative OB ROS                              Anesthesia Physical Anesthesia Plan  ASA: 2  Anesthesia Plan: General ETT   Post-op Pain Management:    Induction: Intravenous  PONV Risk Score and Plan: 4 or greater and Ondansetron ,  Dexamethasone , Propofol  infusion, TIVA and Midazolam   Airway Management Planned: Oral ETT  Additional Equipment:   Intra-op Plan:   Post-operative Plan: Extubation in OR  Informed Consent: I have reviewed the patients History and Physical, chart, labs and discussed the procedure including the risks, benefits and alternatives for the proposed anesthesia with the patient or authorized representative who has indicated his/her understanding and acceptance.     Dental Advisory Given  Plan Discussed with: Anesthesiologist, CRNA and Surgeon  Anesthesia Plan Comments: (Patient consented for risks of anesthesia including but not limited to:  - adverse reactions to medications - damage to eyes, teeth, lips or other oral mucosa - nerve damage due to positioning  - sore throat or hoarseness - Damage to heart, brain, nerves, lungs, other parts of body or loss of life  Patient voiced  understanding and assent.)        Anesthesia Quick Evaluation

## 2024-04-01 NOTE — Op Note (Addendum)
 Robotic assisted laparoscopic repair of  paraesophageal  hernia with Bio-A Mesh and partial fundoplication Repair umbilical hernia via same Hasson incision  Pre-operative Diagnosis: giant paraesophageal hernia  Post-operative Diagnosis: same  Procedure:  Robotic assisted laparoscopic repair of  paraesophageal  hernia with Bio-A Mesh and partial 320 degree fundoplication  Surgeon: Laneta Luna, MD FACS  Assistant: Shelba Rakers RNFA. Required due to the complexity of the case the need for exposure and lack of first assist.  Anesthesia: Gen. 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 The benefits, complications, treatment options, and expected outcomes were discussed with the patient. The risks of bleeding, infection, recurrence of symptoms, failure to resolve symptoms,  esophageal damage, Dysphagia, bowel injury, any of which could require further surgery were reviewed with the patient. The likelihood of improving the patient's symptoms with return to their baseline status is good.  The patient and/or family concurred with the proposed plan, giving informed consent.  The patient was taken to Operating Room, identified  and the procedure verified.  A Time Out was held and the above information confirmed.  Prior to the induction of general anesthesia, antibiotic prophylaxis was administered. VTE prophylaxis was in place. General endotracheal anesthesia was then administered and tolerated well. After the induction, the abdomen was prepped with Chloraprep  and draped in the sterile fashion. The patient was positioned in the supine position.  Cut down technique was used to enter the abdominal cavity and a Hasson trochar was placed after two vicryl stitches were anchored to the fascia. Small umbilical hernia was encountered and sac was excised.  Pneumoperitoneum was then created with CO2 and tolerated well without any adverse changes in the patient's vital signs.  Three 8-mm ports were placed under direct vision. All skin incisions  were infiltrated with a local anesthetic agent before making the incision and placing the trocars. An additional 5 mm regular laparoscopic port was placed to assist with retraction and exposure.   The patient was positioned  in reverse Trendelenburg, robot was brought to the surgical field and docked in the standard fashion.  We made sure all the instrumentation was kept indirect view at all times and that there were no collision between the arms. I scrubbed out and went to the console.  I used a robotic arm to retract the liver, the vessel sealer on my right hand and a forced bipolar grasper on my left hand.  There is along the extra 5 mm port allow me ample exposure and the ability to perform meticulous dissection  We Started dividing the lesser omentum via the pars flaccida.  We Were able to dissect the lesser curvature of the stomach and  dissected the fundus free from the right and left crus.  We circumferentially dissected the GE junction. Please note that the fundus was displaced all the way up into the mediastinum and hernia sac had significant adhesion to the mediastinum. It required meticulous and extensive mediastinal dissection. The work required for a safe procedure was substantially greater than  typically required due to increased complexity of the giant hernia, technical difficulty,  leading to significantly more time and effort from the provider. Mediastinal dissection alone took at least 60 minutes of total  operative time. We did perform a very good dissection within the mediastinum to allow a complete reduction of the sac, gain esophageal length and a to completely allow an intra-abdominal fundoplication. In order to reduce the sac  completely and to restore the stomach to the abdominal cavity the left gastric pedicle needle to be ligated with xlarge clips. THis maneuver allowed us  to bring the stomach to the appropriate anatomical position without creating tension on the repair.  The hernia sac was also completely reduced and we were able to bring the stomach into the intra-abdominal position.  Attention then was turned to the greater curvature where the short gastrics were divided with sealer device.  We were able to identify the left crus and again were able to make sure there was a good circumferential dissection and that the hernia sac was completely excised.  We Asked anesthesia to initially placed ICG via the OG, no evidence of esophageal or gastric perforation were seen. Using two strips of Bio-A as pledgets we approximated the crus with a 2-0V Stratafix suture. A bio-A 10x7 cm mesh was inserted and secured using Vistaseal .    Anesthesia also placed sequential Bougies from 30 to a 50 French bougie and they went easily.  We also observe trajectory of the bougies. 320 degree Nissen fundoplication was created with multiple 2-0 silk sutures and we placed 3 stitches taking some of the esophagus within that bite.  The fundoplication measured approximately 3-1/2 cm and he was floppy. I was very happy with the way the fundoplication laid and the repair of the hernia.  Inspection of the  upper quadrant was performed. No bleeding, bile  Or esophageal injuries leaks, or bowel injuries were noted. Robotic instruments and robotic arms were undocked in the standard fashion. All the needles were removed under direct visualization.   I scrubbed back in.  Pneumoperitoneum was released.  The periumbilical hernia  defect was closed with interrumpted 0 Vicryl sutures. 4-0 subcuticular Monocryl was used to close the skin. 1/4 % marcaine  with epi was injected to all the incisions sites.  Dermabond was  applied.  The patient was then extubated and brought to the recovery room in stable condition. Sponge, lap, and needle counts were correct at closure and at the conclusion of the case.               Laneta Luna, MD, FACS

## 2024-04-01 NOTE — Interval H&P Note (Signed)
 History and Physical Interval Note:  04/01/2024 7:16 AM  Patricia Ferguson  has presented today for surgery, with the diagnosis of Paraesophageal hernia.  The various methods of treatment have been discussed with the patient and family. After consideration of risks, benefits and other options for treatment, the patient has consented to  Procedure(s): REPAIR, HERNIA, PARAESOPHAGEAL, ROBOT-ASSISTED (N/A) as a surgical intervention.  The patient's history has been reviewed, patient examined, no change in status, stable for surgery.  I have reviewed the patient's chart and labs.  Questions were answered to the patient's satisfaction.     Brittania Sudbeck F Charnetta Wulff

## 2024-04-02 ENCOUNTER — Encounter: Payer: Self-pay | Admitting: Surgery

## 2024-04-02 DIAGNOSIS — K449 Diaphragmatic hernia without obstruction or gangrene: Secondary | ICD-10-CM | POA: Diagnosis not present

## 2024-04-02 DIAGNOSIS — Z9889 Other specified postprocedural states: Secondary | ICD-10-CM | POA: Diagnosis not present

## 2024-04-02 LAB — BASIC METABOLIC PANEL WITH GFR
Anion gap: 10 (ref 5–15)
BUN: 16 mg/dL (ref 8–23)
CO2: 26 mmol/L (ref 22–32)
Calcium: 8.4 mg/dL — ABNORMAL LOW (ref 8.9–10.3)
Chloride: 105 mmol/L (ref 98–111)
Creatinine, Ser: 0.61 mg/dL (ref 0.44–1.00)
GFR, Estimated: >60 mL/min (ref >60)
Glucose, Bld: 107 mg/dL — ABNORMAL HIGH (ref 70–99)
Potassium: 4.2 mmol/L (ref 3.5–5.1)
Sodium: 141 mmol/L (ref 135–145)

## 2024-04-02 LAB — CBC
HCT: 31.8 % — ABNORMAL LOW (ref 36.0–46.0)
Hemoglobin: 10.6 g/dL — ABNORMAL LOW (ref 12.0–15.0)
MCH: 29.1 pg (ref 26.0–34.0)
MCHC: 33.3 g/dL (ref 30.0–36.0)
MCV: 87.4 fL (ref 80.0–100.0)
Platelets: 128 K/uL — ABNORMAL LOW (ref 150–400)
RBC: 3.64 MIL/uL — ABNORMAL LOW (ref 3.87–5.11)
RDW: 12.7 % (ref 11.5–15.5)
WBC: 10.3 K/uL (ref 4.0–10.5)
nRBC: 0 % (ref 0.0–0.2)

## 2024-04-02 NOTE — Plan of Care (Signed)
  Problem: Education: Goal: Knowledge of General Education information will improve Description: Including pain rating scale, medication(s)/side effects and non-pharmacologic comfort measures Outcome: Progressing   Problem: Health Behavior/Discharge Planning: Goal: Ability to manage health-related needs will improve Outcome: Progressing   Problem: Activity: Goal: Risk for activity intolerance will decrease Outcome: Progressing   Problem: Nutrition: Goal: Adequate nutrition will be maintained Outcome: Progressing   Problem: Coping: Goal: Level of anxiety will decrease Outcome: Progressing   Problem: Pain Managment: Goal: General experience of comfort will improve and/or be controlled Outcome: Progressing   Problem: Safety: Goal: Ability to remain free from injury will improve Outcome: Progressing

## 2024-04-02 NOTE — Plan of Care (Signed)
Progressing toward discharge

## 2024-04-02 NOTE — Care Management Obs Status (Signed)
 MEDICARE OBSERVATION STATUS NOTIFICATION   Patient Details  Name: Patricia Ferguson MRN: [id number] Date of Birth: 1951-01-19   Medicare Observation Status Notification Given:  Yes    Rojelio SHAUNNA Rattler 04/02/2024, 12:45 PM

## 2024-04-02 NOTE — Progress Notes (Signed)
 [facility] SUR[facility]ICAL PRO[facility]ESS NOTE  Hospital Day(s): 0.   Post op day(s): 1 Day Post-Op.   Interval History:  Patient seen and examined No acute events or new complaints overnight.  Patient reports she feels like she got hit by a truck Upper abdominal and lower chest soreness/discomfort No significant abdominal pain She denied any trouble swallowing, able to tolerate pills/FLD WBC normal at 10.3K Hgb to 10.6; likely dilutional Renal function normal; sCr - 0.61; UO - unmeasured No electrolyte derangements She is on FLD; no issues   Vital signs in last 24 hours: [min-max] current  Temp:  [96.8 F (36 C)-98.3 F (36.8 C)] 98.3 F (36.8 C) (09/10 0734) Pulse Rate:  [62-89] 69 (09/10 0734) Resp:  [11-23] 17 (09/10 0734) BP: (110-133)/(50-70) 118/58 (09/10 0734) SpO2:  [87 %-100 %] 97 % (09/10 0734) Weight:  [83.9 kg] 83.9 kg (09/09 1300)     Height: 5' 3 (160 cm) Weight: 83.9 kg BMI (Calculated): 32.77   Intake/Output last 2 shifts:  09/09 0701 - 09/10 0700 In: 1629.5 [I.V.:1429.5; IV Piggyback:200] Out: 10 [Blood:10]   Physical Exam:  Constitutional: alert, cooperative and no distress  Respiratory: breathing non-labored at rest  Cardiovascular: regular rate and sinus rhythm  Gastrointestinal: soft, non-tender, and non-distended, no rebound/guarding Integumentary: Laparoscopic incisions are CDI with dermabond, no erythema or drainage   Labs:     Latest Ref Rng & Units 04/02/2024    4:36 AM 01/21/2024    8:37 AM 11/28/2023    4:26 PM  CBC  WBC 4.0 - 10.5 K/uL 10.3  3.1  6.1   Hemoglobin 12.0 - 15.0 g/dL 89.3  87.8  89.0   Hematocrit 36.0 - 46.0 % 31.8  37.8  32.3   Platelets 150 - 400 K/uL 128  157  138       Latest Ref Rng & Units 04/02/2024    4:36 AM 11/28/2023    4:26 PM 05/11/2023    9:45 AM  CMP  Glucose 70 - 99 mg/dL 892  868  87   BUN 8 - 23 mg/dL 16  18  12    Creatinine 0.44 - 1.00 mg/dL 9.38  9.32  9.29   Sodium 135 - 145 mmol/L 141   141  143   Potassium 3.5 - 5.1 mmol/L 4.2  3.6  4.2   Chloride 98 - 111 mmol/L 105  105  102   CO2 22 - 32 mmol/L 26  27  26    Calcium  8.9 - 10.3 mg/dL 8.4  8.8  9.2   Total Protein 6.0 - 8.5 g/dL  5.6  6.8   Total Bilirubin 0.0 - 1.2 mg/dL  0.8  1.0   Alkaline Phos 44 - 121 IU/L  60  77   AST 0 - 40 IU/L  29  23   ALT 0 - 32 IU/L  32  16      Imaging studies: No new pertinent imaging studies   Assessment/Plan: 73 y.o. female 1 Day Post-Op s/p robotic assisted laparoscopic paraesophageal hernia repair with Nissen Fundoplication    - Will continue FLD  - Reviewed DG esophagram from 09/09; no issues   - Monitor abdominal examination; on-going bowel function    - Pain control prn; antiemetics prn   - Mobilize as tolerated  - Discharge Planning: She is quite tired/sore this morning. She wishes to stay another 24 hours, which is reasonable. I will reassess and hopefully DC home in the morning (09/11)  All of the above findings and recommendations were discussed with the patient, and the medical team, and all of patient's questions were answered to her expressed satisfaction.  -- Arthea Platt, PA-C [facility] 04/02/2024, 9:14 AM M-F: 7am - 4pm

## 2024-04-03 ENCOUNTER — Observation Stay

## 2024-04-03 DIAGNOSIS — R079 Chest pain, unspecified: Secondary | ICD-10-CM | POA: Diagnosis not present

## 2024-04-03 DIAGNOSIS — K449 Diaphragmatic hernia without obstruction or gangrene: Secondary | ICD-10-CM | POA: Diagnosis not present

## 2024-04-03 DIAGNOSIS — Z9889 Other specified postprocedural states: Secondary | ICD-10-CM | POA: Diagnosis not present

## 2024-04-03 DIAGNOSIS — J939 Pneumothorax, unspecified: Secondary | ICD-10-CM | POA: Diagnosis not present

## 2024-04-03 DIAGNOSIS — R918 Other nonspecific abnormal finding of lung field: Secondary | ICD-10-CM | POA: Diagnosis not present

## 2024-04-03 DIAGNOSIS — T797XXA Traumatic subcutaneous emphysema, initial encounter: Secondary | ICD-10-CM | POA: Diagnosis not present

## 2024-04-03 LAB — SURGICAL PATHOLOGY

## 2024-04-03 MED ORDER — HYDROCODONE-ACETAMINOPHEN 5-325 MG PO TABS: 1.0000 | ORAL_TABLET | Freq: Four times a day (QID) | ORAL | 0 refills | Status: DC | PRN

## 2024-04-03 NOTE — Discharge Summary (Signed)
 [facility] SURGICAL DISCHARGE SUMMARY  Patient ID: Patricia Ferguson MRN: [id number] DOB/AGE: 31-Dec-1950 59 y.o.  Admit date: 04/01/2024 Discharge date: 04/03/2024  Discharge Diagnoses Patient Active Problem List   Diagnosis Date Noted   S/P repair of paraesophageal hernia 04/01/2024   Large hiatal hernia 11/29/2023    Consultants None  Procedures 04/01/2024:  Robotic assisted laparoscopic paraesophageal hernia repair and Nissen Fundoplication   HPI: Patricia Ferguson is a 73 y.o. female with history of paraesophageal hernia who presents to [facility] on 09/09 for scheduled repair  Hospital Course: Informed consent was obtained and documented, and patient underwent uneventful Robotic assisted laparoscopic paraesophageal hernia repair and Nissen Fundoplication (Dr Jordis, 04/01/2024).  Post-operatively, patient did have esophagram which was reassuring. CXR on POD1 as well was reassuring. Otherwise, advancement of patient's diet and ambulation were well-tolerated. The remainder of patient's hospital course was essentially unremarkable, and discharge planning was initiated accordingly with patient safely able to be discharged home with appropriate discharge instructions, pain control, and outpatient  follow-up after all of her questions were answered to her expressed satisfaction.   Discharge Condition: Good   Physical Examination:  Constitutional: alert, cooperative and no distress  Respiratory: breathing non-labored at rest  Cardiovascular: regular rate and sinus rhythm  Gastrointestinal: soft, non-tender, and non-distended, no rebound/guarding Integumentary: Laparoscopic incisions are CDI with dermabond, no erythema or drainage    Allergies as of 04/03/2024       Reactions   Meloxicam  Other (See Comments)   Face got flushed and red.   Adhesive [tape] Rash   PAPER TAPE OKAY   Lipitor [atorvastatin] Other (See Comments)   Muscle aches   Oxycodone Rash   Percodan  [oxycodone-aspirin] Rash   Pravachol [pravastatin Sodium] Other (See Comments)   Muscle aches   Propoxyphene Other (See Comments)   depression        Medication List     PAUSE taking these medications    famotidine  20 MG tablet Wait to take this until your doctor or other care provider tells you to start again. Commonly known as: PEPCID  TAKE 1 TABLET BY MOUTH ONCE DAILY       TAKE these medications    b complex vitamins capsule Take 1 capsule by mouth daily.   Co Q-10 200 MG Caps Take 200 mg by mouth daily.   Cranberry 4200 MG Caps Take 4,200 mg by mouth daily.   ezetimibe  10 MG tablet Commonly known as: ZETIA  TAKE 1 TABLET BY MOUTH ONCE DAILY   gabapentin  100 MG capsule Commonly known as: NEURONTIN  TAKE 2 CAPSULES BY MOUTH AT BEDTIME What changed: how much to take   HYDROcodone -acetaminophen  5-325 MG tablet Commonly known as: NORCO/VICODIN Take 1 tablet by mouth every 6 (six) hours as needed for severe pain (pain score 7-10) or moderate pain (pain score 4-6).   hydroxypropyl methylcellulose / hypromellose 2.5 % ophthalmic solution Commonly known as: ISOPTO TEARS / GONIOVISC Place 1 drop into both eyes daily.   Magnesium 250 MG Tabs Take 250 mg by mouth daily.   ondansetron  4 MG tablet Commonly known as: Zofran  Take 1 tablet (4 mg total) by mouth every 8 (eight) hours as needed for nausea or vomiting.   Potassium 99 MG Tabs Take 99 mg by mouth daily.   rosuvastatin  20 MG tablet Commonly known as: CRESTOR  TAKE 1 TABLET BY MOUTH THREE TIMES A WEEK (MON WEDNESDAY AND FRIDAY)   VITAMIN D -VITAMIN K PO Take 1 tablet by mouth daily.  Durable Medical Equipment  (From admission, onward)           Start     Ordered   04/03/24 1111  For home use only DME Walker rolling  Once       Question Answer Comment  Walker: With 5 Inch Wheels   Patient needs a walker to treat with the following condition Physical deconditioning       04/03/24 1110              Follow-up Information     Pabon, [place] F, MD. Schedule an appointment as soon as possible for a visit in 2 week(s).   Specialty: General Surgery Why: s/p paraesophageal hernia repair Contact information: [address] [place] [address] [phone]                  Time spent on discharge management including discussion of hospital course, clinical condition, outpatient instructions, prescriptions, and follow up with the patient and members of the medical team: >30 minutes  -- Arthea Platt , PA-C [facility]  04/03/2024, 12:24 PM [phone] M-F: 7am - 4pm

## 2024-04-03 NOTE — TOC Initial Note (Signed)
 Transition of Care [facility]) - Initial/Assessment Note    Patient Details  Name: Patricia Ferguson MRN: [id number] Date of Birth: November 10, 1950  [facility] CM/SW Contact:    Delphine KANDICE Bring, RN Phone Number: 04/03/2024, 12:09 PM  Clinical Narrative:                 Patient denies services in the home and DME. Patient states that she has an elevated toilet. Patient requested a RW. CM asked if she has any preference which DME company to use. Patient stated no.   1057 Cm reached out to Mitch   1210: CM received a message from Cameron at [employer]. He states that husband declined RW stating that patient already has one. CM notified Dr. Jordis and nurse about the RW.   Expected Discharge Plan: Home/Self Care Barriers to Discharge: No Barriers Identified   Patient Goals and CMS Choice            Expected Discharge Plan and Services       Living arrangements for the past 2 months: Single Family Home Expected Discharge Date: 04/03/24                                    Prior Living Arrangements/Services Living arrangements for the past 2 months: Single Family Home Lives with:: Spouse Patient language and need for interpreter reviewed:: Yes (English) Do you feel safe going back to the place where you live?: Yes               Activities of Daily Living   ADL Screening (condition at time of admission) Independently performs ADLs?: Yes (appropriate for developmental age) Is the patient deaf or have difficulty hearing?: No Does the patient have difficulty seeing, even when wearing glasses/contacts?: No Does the patient have difficulty concentrating, remembering, or making decisions?: No  Permission Sought/Granted                  Emotional Assessment Appearance:: Appears stated age Attitude/Demeanor/Rapport: Engaged Affect (typically observed): Calm Orientation: : Oriented to Self, Oriented to Place, Oriented to  Time, Oriented to Situation      Admission  diagnosis:  Paraesophageal hernia [K44.9] S/P repair of paraesophageal hernia [S01.109, Z87.19] Patient Active Problem List   Diagnosis Date Noted   S/P repair of paraesophageal hernia 04/01/2024   Large hiatal hernia 11/29/2023   Nodule of lower lobe of right lung 11/29/2023   Melena 11/28/2023   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 [place] [address] Phone: [phone] Fax: [phone]     Social Drivers of Health (SDOH) Social History: SDOH Screenings   Food Insecurity: No Food Insecurity (04/01/2024)  Housing: Low Risk  (04/01/2024)  Transportation Needs: No Transportation Needs (04/01/2024)  Utilities: Not At Risk (04/01/2024)  Alcohol Screen: Low Risk  (  05/11/2023)  Depression (PHQ2-9): Low Risk  (05/11/2023)  Financial Resource Strain: Low Risk  (11/28/2023)  Physical Activity: Insufficiently Active (11/28/2023)  Social Connections: Patient Declined (04/01/2024)  Stress: No Stress Concern Present (11/28/2023)  Tobacco Use: Low Risk  (04/01/2024)  Health Literacy: Adequate Health Literacy (05/11/2023)   SDOH Interventions:     Readmission Risk Interventions     No data to display

## 2024-04-03 NOTE — Plan of Care (Signed)
  Problem: Education: °Goal: Knowledge of General Education information will improve °Description: Including pain rating scale, medication(s)/side effects and non-pharmacologic comfort measures °Outcome: Progressing °  °Problem: Health Behavior/Discharge Planning: °Goal: Ability to manage health-related needs will improve °Outcome: Progressing °  °Problem: Clinical Measurements: °Goal: Ability to maintain clinical measurements within normal limits will improve °Outcome: Progressing °  °Problem: Activity: °Goal: Risk for activity intolerance will decrease °Outcome: Progressing °  °Problem: Coping: °Goal: Level of anxiety will decrease °Outcome: Progressing °  °Problem: Safety: °Goal: Ability to remain free from injury will improve °Outcome: Progressing °  °Problem: Skin Integrity: °Goal: Risk for impaired skin integrity will decrease °Outcome: Progressing °  °

## 2024-04-03 NOTE — Progress Notes (Signed)
 DISCHARGE NOTE:   Pt dc with IV removed and discharge instructions given. Pt received a RW delivered to hospital room.  Pt voices no question or concerns at this time. Pt wheeled down to medical mall entrance by staff. Pt's husband provided transportation.

## 2024-04-03 NOTE — Progress Notes (Signed)
 CC: POD# 2 Subjective: Having some chest pain No SOB, sats 95% on RA Walked around nurse station, tolerating diet Good UO  Objective: Vital signs in last 24 hours: Temp:  [98.2 F (36.8 C)-98.4 F (36.9 C)] 98.4 F (36.9 C) (09/11 0745) Pulse Rate:  [72-82] 81 (09/11 0745) Resp:  [16-18] 17 (09/11 0745) BP: (111-141)/(61-65) 141/65 (09/11 0745) SpO2:  [94 %-98 %] 95 % (09/11 0745)    Intake/Output from previous day: No intake/output data recorded. Intake/Output this shift: No intake/output data recorded.  Physical exam:  NAD alert Able to carry a conversation w/o dyspnea Chest CTA Abd: soft, minimal appropriate tenderness w/o peritonitis  Lab Results: CBC  Recent Labs    04/02/24 0436  WBC 10.3  HGB 10.6*  HCT 31.8*  PLT 128*   BMET Recent Labs    04/02/24 0436  NA 141  K 4.2  CL 105  CO2 26  GLUCOSE 107*  BUN 16  CREATININE 0.61  CALCIUM  8.4*   PT/INR No results for input(s): LABPROT, INR in the last 72 hours. ABG No results for input(s): PHART, HCO3 in the last 72 hours.  Invalid input(s): PCO2, PO2  Studies/Results: DG Chest 2 View Result Date: 04/03/2024 CLINICAL DATA:  Chest pain. EXAM: CHEST - 2 VIEW COMPARISON:  July 09, 2023. FINDINGS: Stable cardiomediastinal silhouette. Hypoinflation of the lungs is noted with minimal bibasilar subsegmental atelectasis. Bony thorax is unremarkable. Minimal right apical pneumothorax is noted. Subcutaneous emphysema is noted in both supraclavicular regions. IMPRESSION: Probable minimal right apical pneumothorax is noted. Mild subcutaneous emphysema is noted in the bilateral supraclavicular regions. CT scan may be performed to evaluate for possible pneumomediastinum. These results will be called to the ordering clinician or representative by the Radiologist Assistant, and communication documented in the PACS or zVision Dashboard. Electronically Signed   By: Lynwood Landy Raddle M.D.   On: 04/03/2024  10:59   DG ESOPHAGUS W SINGLE CM (SOL OR THIN BA) Result Date: 04/01/2024 CLINICAL DATA:  Patient status post robotic assisted laparoscopic repair of paraesophageal hernia with mesh placement and partial fundoplication immediately prior to this study. Patient arriving from PACU. IR was requested for postoperative evaluation to rule out perforation/leakage. EXAM: ESOPHAGUS/BARIUM SWALLOW/TABLET STUDY TECHNIQUE: Single contrast examination was performed using water-soluble contrast. This exam was performed by Carlin Griffon, PA-C, and was supervised and interpreted by Dr. Wilkie Lent. FLUOROSCOPY: Radiation Exposure Index (as provided by the fluoroscopic device): 29.90 mGy Kerma COMPARISON:  DG esophagus with double contrast media on 03/20/2024. FINDINGS: Swallowing: Appears normal. No vestibular penetration or aspiration seen. Pharynx: Unremarkable. Esophagus: Patulous esophagus, with significant narrowing noted at surgical intervention site. Esophageal motility: Dysmotility with notable tertiary contractions, tortuous esophagus. Hiatal Hernia: Moderate hiatal hernia. Gastroesophageal reflux: Not evaluated. Ingested 13 mm barium tablet: Not given Other: Study was severely limited by patient's grogginess as she is still recovering from anesthesia. Patient unable to stand, rotate side-to-side. No extravasation of water-soluble contrast media noted. IMPRESSION: No extravasation of water-soluble contrast media noted status post laparoscopic repair of paraesophageal hernia with partial fundoplication. Electronically Signed   By: Wilkie Lent M.D.   On: 04/01/2024 16:31    Anti-infectives: Anti-infectives (From admission, onward)    Start     Dose/Rate Route Frequency Ordered Stop   04/01/24 1600  ceFAZolin  (ANCEF ) IVPB 2g/100 mL premix        2 g 200 mL/hr over 30 Minutes Intravenous Every 8 hours 04/01/24 1105 04/02/24 0607   04/01/24 0600  ceFAZolin  (ANCEF )  IVPB 2g/100 mL premix        2 g 200  mL/hr over 30 Minutes Intravenous On call to O.R. 04/01/24 0058 04/01/24 0800       Assessment/Plan: Doing ok but w chest pain, not cardiac, cxr expected pneumomediastinum due to extensive dissection and used of CO2, no contrast within mediastinum. Tiny apical PTX. SHe is not toxic  No evidence of complications Discharge will be based on clinical condition, may need to stay another day    Laneta Luna, MD, [facility]  04/03/2024

## 2024-04-03 NOTE — Discharge Instructions (Signed)
 In addition to included general post-operative instructions,  Diet: Recommend following Nissen diet restriction for the next 4 weeks. Handout provided regarding this.   Activity: No heavy lifting >20 pounds (children, pets, laundry, garbage) or strenuous activity for 4 weeks, but light activity and walking are encouraged. Do not drive or drink alcohol if taking narcotic pain medications or having pain that might distract from driving.  Wound care: You may shower/get incision wet with soapy water and pat dry (do not rub incisions), but no baths or submerging incision underwater until follow-up.   Medications: Resume all home medications. For mild to moderate pain: acetaminophen  (Tylenol ) or ibuprofen /naproxen (if no kidney disease). Combining Tylenol  with alcohol can substantially increase your risk of causing liver disease. Narcotic pain medications, if prescribed, can be used for severe pain, though may cause nausea, constipation, and drowsiness. Do not combine Tylenol  and Percocet (or similar) within a 6 hour period as Percocet (and similar) contain(s) Tylenol . If you do not need the narcotic pain medication, you do not need to fill the prescription.  Call office [phone] / [phone]) at any time if any questions, worsening pain, fevers/chills, bleeding, drainage from incision site, or other concerns.

## 2024-04-14 ENCOUNTER — Encounter: Payer: Self-pay | Admitting: Surgery

## 2024-04-14 ENCOUNTER — Ambulatory Visit (INDEPENDENT_AMBULATORY_CARE_PROVIDER_SITE_OTHER): Admitting: Surgery

## 2024-04-14 VITALS — BP 110/69 | HR 68 | Ht 63.0 in | Wt 178.0 lb

## 2024-04-14 DIAGNOSIS — K449 Diaphragmatic hernia without obstruction or gangrene: Secondary | ICD-10-CM

## 2024-04-14 DIAGNOSIS — Z09 Encounter for follow-up examination after completed treatment for conditions other than malignant neoplasm: Secondary | ICD-10-CM

## 2024-04-14 NOTE — Progress Notes (Signed)
 Outpatient Surgical Follow Up  04/14/2024  Patricia Ferguson is an 73 y.o. female.   Chief Complaint  Patient presents with   Routine Post Op    HPI: s/p rob paraesophageal. Doing well, much improved. Feels much better as compared to preop state. No reflux, no dysphagia. 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 SCREWS IN BACK   bone spur     BREAST BIOPSY Right 1980's   BREAST SURGERY     CERVICAL CONE BIOPSY     COLONOSCOPY N/A 01/02/2024   Procedure: COLONOSCOPY;  Surgeon: Marinda Jayson KIDD, MD;  Location: [facility] ENDOSCOPY;  Service: General;  Laterality: N/A;   COLONOSCOPY WITH PROPOFOL  N/A 06/02/2022   Procedure: COLONOSCOPY WITH PROPOFOL ;  Surgeon: Therisa Bi, MD;  Location: [facility] ENDOSCOPY;  Service: Gastroenterology;  Laterality: N/A;   ESOPHAGOGASTRODUODENOSCOPY N/A 01/02/2024   Procedure: EGD (ESOPHAGOGASTRODUODENOSCOPY);  Surgeon: Marinda Jayson KIDD, MD;  Location: [facility] ENDOSCOPY;  Service: General;  Laterality: N/A;   HAMMER TOE SURGERY Right 02/01/2023   INSERTION OF MESH  04/01/2024   Procedure: INSERTION OF MESH;  Surgeon: Jordis Laneta FALCON, MD;  Location: [facility] ORS;  Service: General;;   lumbar fission     MASS EXCISION Left 01/07/2020   Procedure: EXCISION MASS (RESECTION OF ANTECUBITAL VENOUS ANEURYSM);  Surgeon: Jama Cordella MATSU, MD;  Location: [facility] ORS;  Service: Vascular;  Laterality: Left;   SPINE SURGERY  2003   XI ROBOTIC ASSISTED PARAESOPHAGEAL HERNIA REPAIR N/A 04/01/2024   Procedure: REPAIR, HERNIA, PARAESOPHAGEAL,  ROBOT-ASSISTED;  Surgeon: Jordis Laneta FALCON, MD;  Location: [facility] ORS;  Service: General;  Laterality: N/A;    Family History  Problem Relation Age of Onset   Diabetes Mother    Hyperlipidemia Mother    Hypertension Mother    Osteoporosis Mother    Arthritis Mother    Cancer Father        lung   Diabetes Father    Diabetes Sister    Hyperlipidemia Sister    Hypertension Sister    Hypertension Brother    Hyperlipidemia Sister    Breast cancer Neg Hx     Social History:  reports that she has never smoked. She has never been exposed to tobacco smoke. She has never used smokeless tobacco. She reports that she does not currently use alcohol. She reports that she does not use drugs.  Allergies:  Allergies  Allergen Reactions   Meloxicam  Other (See Comments)    Face got flushed and red.   Adhesive [Tape] Rash    PAPER TAPE OKAY   Lipitor [Atorvastatin] Other (See Comments)    Muscle aches   Oxycodone Rash   Percodan [Oxycodone-Aspirin] Rash   Pravachol [Pravastatin Sodium] Other (See Comments)    Muscle aches    Propoxyphene Other (See Comments)    depression    Medications reviewed.    ROS Full ROS performed and is otherwise negative other  than what is stated in HPI   BP 110/69   Pulse 68   Ht 5' 3 (1.6 m)   Wt 178 lb (80.7 kg)   LMP  (LMP Unknown)   SpO2 98%   BMI 31.53 kg/m   Physical Exam NAd alert Abd: soft, nt , incisions c/d/I, no infection or peritonitis   Assessment/Plan: Doing well w/o complications May return to work [employer] 3 months or so  Laneta Luna, MD [employer] General Surgeon

## 2024-04-14 NOTE — Patient Instructions (Signed)

## 2024-04-18 ENCOUNTER — Encounter: Admitting: Podiatry

## 2024-04-22 ENCOUNTER — Encounter: Admitting: Podiatry

## 2024-04-22 ENCOUNTER — Encounter: Payer: Self-pay | Admitting: Podiatry

## 2024-04-22 ENCOUNTER — Ambulatory Visit (INDEPENDENT_AMBULATORY_CARE_PROVIDER_SITE_OTHER): Admitting: Podiatry

## 2024-04-22 ENCOUNTER — Ambulatory Visit (INDEPENDENT_AMBULATORY_CARE_PROVIDER_SITE_OTHER)

## 2024-04-22 VITALS — Ht 63.0 in | Wt 178.0 lb

## 2024-04-22 DIAGNOSIS — M2042 Other hammer toe(s) (acquired), left foot: Secondary | ICD-10-CM

## 2024-04-22 NOTE — Progress Notes (Signed)
 No chief complaint on file.   Subjective:  Patient presents today status post hammertoe repair digits 2, 3, 4 left foot.  DOS: 02/01/2024.  Doing well.  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 SCREWS IN BACK   bone spur     BREAST BIOPSY Right 1980's   BREAST SURGERY     CERVICAL CONE BIOPSY     COLONOSCOPY N/A 01/02/2024   Procedure: COLONOSCOPY;  Surgeon: Marinda Jayson KIDD, MD;  Location: [facility] ENDOSCOPY;  Service: General;  Laterality: N/A;   COLONOSCOPY WITH PROPOFOL  N/A 06/02/2022   Procedure: COLONOSCOPY WITH PROPOFOL ;  Surgeon: Therisa Bi, MD;  Location: [facility] ENDOSCOPY;  Service: Gastroenterology;  Laterality: N/A;   ESOPHAGOGASTRODUODENOSCOPY N/A 01/02/2024   Procedure: EGD (ESOPHAGOGASTRODUODENOSCOPY);  Surgeon: Marinda Jayson KIDD, MD;  Location: [facility] ENDOSCOPY;  Service: General;  Laterality: N/A;   HAMMER TOE SURGERY Right 02/01/2023   INSERTION OF MESH  04/01/2024   Procedure: INSERTION OF MESH;  Surgeon: Jordis Laneta FALCON, MD;  Location: [facility] ORS;  Service: General;;   lumbar fission     MASS EXCISION Left 01/07/2020   Procedure: EXCISION MASS (RESECTION OF ANTECUBITAL VENOUS ANEURYSM);  Surgeon: Jama Cordella MATSU, MD;  Location: [facility] ORS;  Service: Vascular;  Laterality: Left;   SPINE SURGERY  2003   XI ROBOTIC ASSISTED PARAESOPHAGEAL HERNIA REPAIR N/A 04/01/2024   Procedure: REPAIR, HERNIA, PARAESOPHAGEAL, ROBOT-ASSISTED;  Surgeon: Jordis Laneta FALCON, MD;  Location: [facility] ORS;  Service: General;   Laterality: N/A;    Allergies  Allergen Reactions   Meloxicam  Other (See Comments)    Face got flushed and red.   Adhesive [Tape] Rash    PAPER TAPE OKAY   Lipitor [Atorvastatin] Other (See Comments)    Muscle aches   Oxycodone Rash   Percodan [Oxycodone-Aspirin] Rash   Pravachol [Pravastatin Sodium] Other (See Comments)    Muscle aches    Propoxyphene Other (See Comments)    depression    Objective/Physical Exam Neurovascular status intact.  Incisions are nicely healed.  Minimal edema noted.  No erythema.  Good rectus alignment of the toes.  No tenderness with palpation or range of motion  Radiographic Exam LT foot 04/22/2024:  Arthroplasty noted to the IPJ digits 2, 3, 4 with routine healing.  Good alignment of the digital rays.  Assessment: 1. s/p hammertoe repair digits 2, 3, 4 LT foot. DOS: 02/01/2024   Plan of Care:  -Patient was evaluated.  - Okay to resume full activity no restrictions.  Recommend good supportive tennis shoes and sneakers -Return to clinic PRN  Thresa EMERSON Sar, DPM [facility]  Dr. Thresa EMERSON Sar, DPM    [address]  [place], [place] [address]                Office [phone]  Fax [phone]

## 2024-05-12 NOTE — Patient Instructions (Signed)
 Be Involved in Caring For Your Health:  Taking Medications When medications are taken as directed, they can greatly improve your health. But if they are not taken as prescribed, they may not work. In some cases, not taking them correctly can be harmful. To help ensure your treatment remains effective and safe, understand your medications and how to take them. Bring your medications to each visit for review by your provider.  Your lab results, notes, and after visit summary will be available on My Chart. We strongly encourage you to use this feature. If lab results are abnormal the clinic will contact you with the appropriate steps. If the clinic does not contact you assume the results are satisfactory. You can always view your results on My Chart. If you have questions regarding your health or results, please contact the clinic during office hours. You can also ask questions on My Chart.  We at [facility] are grateful that you chose us  to provide your care. We strive to provide evidence-based and compassionate care and are always looking for feedback. If you get a survey from the clinic please complete this so we can hear your opinions.  Healthy Eating, Adult Healthy eating may help you get and keep a healthy body weight, reduce the risk of chronic disease, and live a long and productive life. It is important to follow a healthy eating pattern. Your nutritional and calorie needs should be met mainly by different nutrient-rich foods. What are tips for following this plan? Reading food labels Read labels and choose the following: Reduced or low sodium products. Juices with 100% fruit juice. Foods with low saturated fats (<3 g per serving) and high polyunsaturated and monounsaturated fats. Foods with whole grains, such as whole wheat, cracked wheat, brown rice, and wild rice. Whole grains that are fortified with folic acid. This is recommended for females who are pregnant or who want to  become pregnant. Read labels and do not eat or drink the following: Foods or drinks with added sugars. These include foods that contain brown sugar, corn sweetener, corn syrup, dextrose , fructose, glucose, high-fructose corn syrup, honey, invert sugar, lactose, malt syrup, maltose, molasses, raw sugar, sucrose, trehalose, or turbinado sugar. Limit your intake of added sugars to less than 10% of your total daily calories. Do not eat more than the following amounts of added sugar per day: 6 teaspoons (25 g) for females. 9 teaspoons (38 g) for males. Foods that contain processed or refined starches and grains. Refined grain products, such as white flour, degermed cornmeal, white bread, and white rice. Shopping Choose nutrient-rich snacks, such as vegetables, whole fruits, and nuts. Avoid high-calorie and high-sugar snacks, such as potato chips, fruit snacks, and candy. Use oil-based dressings and spreads on foods instead of solid fats such as butter, margarine, sour cream, or cream cheese. Limit pre-made sauces, mixes, and instant products such as flavored rice, instant noodles, and ready-made pasta. Try more plant-protein sources, such as tofu, tempeh, black beans, edamame, lentils, nuts, and seeds. Explore eating plans such as the Mediterranean diet or vegetarian diet. Try heart-healthy dips made with beans and healthy fats like hummus and guacamole. Vegetables go great with these. Cooking Use oil to saut or stir-fry foods instead of solid fats such as butter, margarine, or lard. Try baking, boiling, grilling, or broiling instead of frying. Remove the fatty part of meats before cooking. Steam vegetables in water  or broth. Meal planning  At meals, imagine dividing your plate into fourths: One-half of  your plate is fruits and vegetables. One-fourth of your plate is whole grains. One-fourth of your plate is protein, especially lean meats, poultry, eggs, tofu, beans, or nuts. Include low-fat  dairy as part of your daily diet. Lifestyle Choose healthy options in all settings, including home, work, school, restaurants, or stores. Prepare your food safely: Wash your hands after handling raw meats. Where you prepare food, keep surfaces clean by regularly washing with hot, soapy water . Keep raw meats separate from ready-to-eat foods, such as fruits and vegetables. Cook seafood, meat, poultry, and eggs to the recommended temperature. Get a food thermometer. Store foods at safe temperatures. In general: Keep cold foods at 84F (4.4C) or below. Keep hot foods at 184F (60C) or above. Keep your freezer at [facility] (-17.8C) or below. Foods are not safe to eat if they have been between the temperatures of 40-184F (4.4-60C) for more than 2 hours. What foods should I eat? Fruits Aim to eat 1-2 cups of fresh, canned (in natural juice), or frozen fruits each day. One cup of fruit equals 1 small apple, 1 large banana, 8 large strawberries, 1 cup (237 g) canned fruit,  cup (82 g) dried fruit, or 1 cup (240 mL) 100% juice. Vegetables Aim to eat 2-4 cups of fresh and frozen vegetables each day, including different varieties and colors. One cup of vegetables equals 1 cup (91 g) broccoli or cauliflower florets, 2 medium carrots, 2 cups (150 g) raw, leafy greens, 1 large tomato, 1 large bell pepper, 1 large sweet potato, or 1 medium white potato. Grains Aim to eat 5-10 ounce-equivalents of whole grains each day. Examples of 1 ounce-equivalent of grains include 1 slice of bread, 1 cup (40 g) ready-to-eat cereal, 3 cups (24 g) popcorn, or  cup (93 g) cooked rice. Meats and other proteins Try to eat 5-7 ounce-equivalents of protein each day. Examples of 1 ounce-equivalent of protein include 1 egg,  oz nuts (12 almonds, 24 pistachios, or 7 walnut halves), 1/4 cup (90 g) cooked beans, 6 tablespoons (90 g) hummus or 1 tablespoon (16 g) peanut butter. A cut of meat or fish that is the size of a deck of  cards is about 3-4 ounce-equivalents (85 g). Of the protein you eat each week, try to have at least 8 sounce (227 g) of seafood. This is about 2 servings per week. This includes salmon, trout, herring, sardines, and anchovies. Dairy Aim to eat 3 cup-equivalents of fat-free or low-fat dairy each day. Examples of 1 cup-equivalent of dairy include 1 cup (240 mL) milk, 8 ounces (250 g) yogurt, 1 ounces (44 g) natural cheese, or 1 cup (240 mL) fortified soy milk. Fats and oils Aim for about 5 teaspoons (21 g) of fats and oils per day. Choose monounsaturated fats, such as canola and olive oils, mayonnaise made with olive oil or avocado oil, avocados, peanut butter, and most nuts, or polyunsaturated fats, such as sunflower, corn, and soybean oils, walnuts, pine nuts, sesame seeds, sunflower seeds, and flaxseed. Beverages Aim for 6 eight-ounce glasses of water  per day. Limit coffee to 3-5 eight-ounce cups per day. Limit caffeinated beverages that have added calories, such as soda and energy drinks. If you drink alcohol: Limit how much you have to: 0-1 drink a day if you are female. 0-2 drinks a day if you are female. Know how much alcohol is in your drink. In the [place] one drink is one 12 oz bottle of beer (355 mL), one 5 oz glass of wine (  148 mL), or one 1 oz glass of hard liquor (44 mL). Seasoning and other foods Try not to add too much salt to your food. Try using herbs and spices instead of salt. Try not to add sugar to food. This information is based on [place] nutrition guidelines. To learn more, visit DisposableN[employer].be. Exact amounts may vary. You may need different amounts. This information is not intended to replace advice given to you by your health care provider. Make sure you discuss any questions you have with your health care provider. Document Revised: 04/10/2022 Document Reviewed: 04/10/2022 Elsevier Patient Education  2024 [employer].

## 2024-05-15 ENCOUNTER — Encounter: Payer: Self-pay | Admitting: Nurse Practitioner

## 2024-05-15 ENCOUNTER — Ambulatory Visit (INDEPENDENT_AMBULATORY_CARE_PROVIDER_SITE_OTHER): Admitting: Nurse Practitioner

## 2024-05-15 VITALS — BP 120/76 | HR 82 | Temp 98.0°F | Ht 63.0 in | Wt 180.6 lb

## 2024-05-15 DIAGNOSIS — T466X5A Adverse effect of antihyperlipidemic and antiarteriosclerotic drugs, initial encounter: Secondary | ICD-10-CM

## 2024-05-15 DIAGNOSIS — E538 Deficiency of other specified B group vitamins: Secondary | ICD-10-CM

## 2024-05-15 DIAGNOSIS — I700 Atherosclerosis of aorta: Secondary | ICD-10-CM

## 2024-05-15 DIAGNOSIS — Z0000 Encounter for general adult medical examination without abnormal findings: Secondary | ICD-10-CM

## 2024-05-15 DIAGNOSIS — M791 Myalgia, unspecified site: Secondary | ICD-10-CM | POA: Diagnosis not present

## 2024-05-15 DIAGNOSIS — E559 Vitamin D deficiency, unspecified: Secondary | ICD-10-CM

## 2024-05-15 DIAGNOSIS — E782 Mixed hyperlipidemia: Secondary | ICD-10-CM | POA: Diagnosis not present

## 2024-05-15 DIAGNOSIS — M85831 Other specified disorders of bone density and structure, right forearm: Secondary | ICD-10-CM | POA: Diagnosis not present

## 2024-05-15 DIAGNOSIS — Z23 Encounter for immunization: Secondary | ICD-10-CM

## 2024-05-15 DIAGNOSIS — E66811 Obesity, class 1: Secondary | ICD-10-CM | POA: Diagnosis not present

## 2024-05-15 DIAGNOSIS — G2581 Restless legs syndrome: Secondary | ICD-10-CM

## 2024-05-15 DIAGNOSIS — E6609 Other obesity due to excess calories: Secondary | ICD-10-CM

## 2024-05-15 DIAGNOSIS — Z6834 Body mass index (BMI) 34.0-34.9, adult: Secondary | ICD-10-CM

## 2024-05-15 MED ORDER — FAMOTIDINE 20 MG PO TABS: 20.0000 mg | ORAL_TABLET | Freq: Every day | ORAL | 3 refills | Status: DC

## 2024-05-15 MED ORDER — ROSUVASTATIN CALCIUM 20 MG PO TABS: ORAL_TABLET | ORAL | 4 refills | Status: AC

## 2024-05-15 MED ORDER — EZETIMIBE 10 MG PO TABS: 10.0000 mg | ORAL_TABLET | Freq: Every day | ORAL | 4 refills | Status: AC

## 2024-05-15 MED ORDER — GABAPENTIN 100 MG PO CAPS: 100.0000 mg | ORAL_CAPSULE | Freq: Every day | ORAL | 4 refills | Status: AC

## 2024-05-15 NOTE — Assessment & Plan Note (Signed)
BMI 31.99.  Recommended eating smaller high protein, low fat meals more frequently and exercising 30 mins a day 5 times a week with a goal of 10-15lb weight loss in the next 3 months. Patient voiced their understanding and motivation to adhere to these recommendations.  

## 2024-05-15 NOTE — Assessment & Plan Note (Signed)
Chronic, ongoing with poor statin tolerance in past. Continue current medication regimen, Zetia and Crestor as is tolerating this on 3 day a week regimen.  Could consider discontinuation of Zetia in future if improved levels.  Lipid panel today.

## 2024-05-15 NOTE — Assessment & Plan Note (Signed)
History of trial on various statins with side effect of myalgia, continue Zetia and tolerating Crestor at three days a week at this time.  Continue this and if any adverse effects will consider change to Repatha. 

## 2024-05-15 NOTE — Progress Notes (Signed)
 Subjective:   Patricia Ferguson is a 73 y.o. female who presents for Medicare Annual (Subsequent) preventive examination.  Visit Complete: In person  Patient Medicare AWV questionnaire was completed by the patient on 05/15/2024; I have confirmed that all information answered by patient is correct and no changes since this date.  Cardiac Risk Factors include: advanced age (>10men, >86 women);obesity (BMI >30kg/m2);dyslipidemia     Objective:    Today's Vitals   05/15/24 0802  BP: 120/76  Pulse: 82  Temp: 98 F (36.7 C)  TempSrc: Oral  SpO2: 94%  Weight: 180 lb 9.6 oz (81.9 kg)  Height: 5' 3 (1.6 m)  PainSc: 0-No pain   Body mass index is 31.99 kg/m.     04/01/2024    6:25 AM 03/25/2024   11:17 AM 01/02/2024    9:36 AM 06/02/2022   10:15 AM 03/20/2022   11:05 AM 10/03/2021    2:50 PM 03/18/2021   11:17 AM  Advanced Directives  Does Patient Have a Medical Advance Directive? No No No No No No No  Would patient like information on creating a medical advance directive? No - Patient declined No - Patient declined   No - Patient declined No - Patient declined     Current Medications (verified) Outpatient Encounter Medications as of 05/15/2024  Medication Sig   b complex vitamins capsule Take 1 capsule by mouth daily.   Coenzyme Q10 (CO Q-10) 200 MG CAPS Take 200 mg by mouth daily.   Cranberry 4200 MG CAPS Take 4,200 mg by mouth daily.   hydroxypropyl methylcellulose / hypromellose (ISOPTO TEARS / GONIOVISC) 2.5 % ophthalmic solution Place 1 drop into both eyes daily.   Magnesium 250 MG TABS Take 250 mg by mouth daily.   Potassium 99 MG TABS Take 99 mg by mouth daily.   VITAMIN D -VITAMIN K PO Take 1 tablet by mouth daily.   [DISCONTINUED] ezetimibe  (ZETIA ) 10 MG tablet TAKE 1 TABLET BY MOUTH ONCE DAILY   [DISCONTINUED] famotidine  (PEPCID ) 20 MG tablet TAKE 1 TABLET BY MOUTH ONCE DAILY   [DISCONTINUED] gabapentin  (NEURONTIN ) 100 MG capsule TAKE 2 CAPSULES BY MOUTH AT BEDTIME  (Patient taking differently: Take 100 mg by mouth at bedtime.)   [DISCONTINUED] rosuvastatin  (CRESTOR ) 20 MG tablet TAKE 1 TABLET BY MOUTH THREE TIMES A WEEK (MON WEDNESDAY AND FRIDAY)   ezetimibe  (ZETIA ) 10 MG tablet Take 1 tablet (10 mg total) by mouth daily.   gabapentin  (NEURONTIN ) 100 MG capsule Take 1 capsule (100 mg total) by mouth at bedtime.   rosuvastatin  (CRESTOR ) 20 MG tablet TAKE 1 TABLET BY MOUTH THREE TIMES A WEEK (MON WEDNESDAY AND FRIDAY)   [DISCONTINUED] famotidine  (PEPCID ) 20 MG tablet Take 1 tablet (20 mg total) by mouth daily.   No facility-administered encounter medications on file as of 05/15/2024.    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 SCREWS IN BACK   bone spur     BREAST BIOPSY Right 1980's   BREAST SURGERY     CERVICAL CONE BIOPSY     COLONOSCOPY N/A 01/02/2024   Procedure: COLONOSCOPY;  Surgeon: Marinda Jayson KIDD, MD;  Location: [facility] ENDOSCOPY;  Service: General;  Laterality: N/A;   COLONOSCOPY WITH PROPOFOL  N/A 06/02/2022   Procedure: COLONOSCOPY WITH PROPOFOL ;  Surgeon: Therisa Bi, MD;  Location: [facility] ENDOSCOPY;  Service: Gastroenterology;  Laterality: N/A;   ESOPHAGOGASTRODUODENOSCOPY N/A 01/02/2024   Procedure: EGD (ESOPHAGOGASTRODUODENOSCOPY);  Surgeon: Marinda Jayson KIDD, MD;  Location: [facility] ENDOSCOPY;  Service: General;  Laterality: N/A;   HAMMER TOE  SURGERY Right 02/01/2023   INSERTION OF MESH  04/01/2024   Procedure: INSERTION OF MESH;  Surgeon: Jordis Laneta FALCON, MD;  Location: [facility] ORS;  Service: General;;   lumbar fission     MASS EXCISION Left 01/07/2020   Procedure: EXCISION MASS (RESECTION OF ANTECUBITAL VENOUS ANEURYSM);  Surgeon: Jama Cordella MATSU, MD;  Location: [facility] ORS;  Service: Vascular;  Laterality: Left;   SPINE SURGERY  2003   XI ROBOTIC ASSISTED PARAESOPHAGEAL HERNIA REPAIR N/A 04/01/2024   Procedure: REPAIR, HERNIA, PARAESOPHAGEAL, ROBOT-ASSISTED;  Surgeon: Jordis Laneta FALCON, MD;  Location: [facility] ORS;  Service: General;  Laterality: N/A;   Family History  Problem Relation Age of Onset   Diabetes Mother    Hyperlipidemia Mother    Hypertension Mother    Osteoporosis Mother    Arthritis Mother    Cancer Father        lung   Diabetes Father    Diabetes Sister    Hyperlipidemia Sister    Hypertension Sister    Hypertension Brother    Hyperlipidemia Sister    Breast cancer Neg Hx    Social History   Socioeconomic History   Marital status: Married    Spouse name: Lynwood   Number of children: Not on file   Years of education: some college    Highest education level: Associate degree: occupational, Scientist, product/process development, or vocational program  Occupational History   Occupation: Surveyor, 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    Lives with husband.   Social Drivers of Corporate investment banker Strain: Low Risk  (05/08/2024)   Overall Financial Resource Strain (CARDIA)    Difficulty of Paying Living Expenses: Not hard at all  Food Insecurity: No Food Insecurity  (05/08/2024)   Hunger Vital Sign    Worried About Running Out of Food in the Last Year: Never true    Ran Out of Food in the Last Year: Never true  Transportation Needs: No Transportation Needs (05/08/2024)   PRAPARE - Administrator, Civil Service (Medical): No    Lack of Transportation (Non-Medical): No  Physical Activity: Insufficiently Active (05/08/2024)   Exercise Vital Sign    Days of Exercise per Week: 1 day    Minutes of Exercise per Session: 30 min  Stress: No Stress Concern Present (05/08/2024)   [employer] of  Occupational Health - Occupational Stress Questionnaire    Feeling of Stress: Not at all  Social Connections: Socially Integrated (05/08/2024)   Social Connection and Isolation Panel    Frequency of Communication with Friends and Family: More than three times a week    Frequency of Social Gatherings with Friends and Family: Once a week    Attends Religious Services: More than 4 times per year    Active Member of [employer] or Organizations: Yes    Attends Engineer, structural: More than 4 times per year    Marital Status: Married    Tobacco Counseling Counseling given: Not Answered   Clinical Intake:     Pain : No/denies pain Pain Score: 0-No pain     BMI - recorded: 32 Nutritional Status: BMI > 30  Obese Nutritional Risks: None Diabetes: No  How often do you need to have someone help you when you read instructions, pamphlets, or other written materials from your doctor or pharmacy?: 1 - Never  Interpreter Needed?: No  Information entered by :: EMERSON Fogo, CMA   Activities of Daily Living    05/15/2024    8:04 AM 04/01/2024    1:04 PM  In your present state of health, do you have any difficulty performing the following activities:  Hearing? 0 0  Vision? 0 0  Difficulty concentrating or making decisions? 0 0  Walking or climbing stairs? 0   Dressing or bathing? 0   Doing errands, shopping? 0 1  Preparing Food and eating ?  N   Using the Toilet? N   In the past six months, have you accidently leaked urine? Y   Do you have problems with loss of bowel control? N   Managing your Medications? N   Managing your Finances? N   Housekeeping or managing your Housekeeping? N     Patient Care Team: Cannady, Jolene T, NP as PCP - General (Nurse Practitioner)  Indicate any recent Medical Services you may have received from other than [facility] providers in the past year (date may be approximate).     Assessment:   This is a routine wellness examination for Merida.  Hearing/Vision screen No results found.   Goals Addressed   None    Depression Screen    05/15/2024    8:06 AM 05/11/2023    8:26 AM 11/08/2022    8:21 AM 05/09/2022    8:17 AM 04/10/2022   10:29 AM 03/20/2022   11:11 AM 03/14/2022    8:42 AM  PHQ 2/9 Scores  PHQ - 2 Score 0 0 0 1  2 3   PHQ- 9 Score 0 1 0 4  4 9   Exception Documentation     Patient refusal      Fall Risk    05/11/2023    8:26 AM 11/08/2022    8:20 AM 05/09/2022    8:43 AM 05/09/2022    8:16 AM 04/10/2022   10:29 AM  Fall Risk   Falls in the past year? 0 0 0 0 0  Number falls in past yr: 0 0 0 0 0  Injury with Fall? 0 0 0 0 0  Risk for fall due to : No Fall Risks No Fall Risks No Fall Risks No Fall Risks No Fall Risks  Follow up Falls evaluation completed Falls evaluation completed Falls prevention discussed  Falls evaluation completed  Falls evaluation completed      Data saved with a previous flowsheet row definition  MEDICARE RISK AT HOME: Medicare Risk at Home Any stairs in or around the home?: Yes If so, are there any without handrails?: No Home free of loose throw rugs in walkways, pet beds, electrical cords, etc?: Yes Adequate lighting in your home to reduce risk of falls?: Yes Life alert?: No Use of a cane, walker or w/c?: No Grab bars in the bathroom?: Yes Shower chair or bench in shower?: No Elevated toilet seat or a handicapped toilet?: No  TIMED UP  AND GO:  Was the test performed?  Yes  Length of time to ambulate 10 feet: 15 sec Gait steady and fast without use of assistive device    Cognitive Function        05/15/2024    8:05 AM 05/11/2023    8:31 AM 03/20/2022   11:07 AM 03/18/2021   11:19 AM 03/15/2020    9:04 AM  6CIT Screen  What Year? 0 points 0 points 0 points 0 points 0 points  What month? 0 points 0 points 0 points 0 points 0 points  What time? 0 points 0 points 0 points 0 points 0 points  Count back from 20 0 points 0 points 0 points 0 points 0 points  Months in reverse 2 points 0 points 0 points 0 points 0 points  Repeat phrase 0 points 2 points 0 points 2 points 0 points  Total Score 2 points 2 points 0 points 2 points 0 points    Immunizations Immunization History  Administered Date(s) Administered   INFLUENZA, HIGH DOSE SEASONAL PF 05/02/2019, 04/15/2020, 04/26/2023   Influenza-Unspecified 04/21/2016, 04/16/2017, 04/07/2018, 04/12/2021, 04/23/2022, 03/31/2024   Moderna Covid-19 Fall Seasonal Vaccine 63yrs & older 08/30/2022, 04/04/2023   PFIZER Comirnaty(Gray Top)Covid-19 Tri-Sucrose Vaccine 10/25/2020   PFIZER(Purple Top)SARS-COV-2 Vaccination 09/08/2019, 09/29/2019, 04/24/2020, 10/25/2020, 04/04/2023   Pfizer Covid-19 Vaccine Bivalent Booster 85yrs & up 04/02/2021   Pneumococcal Conjugate-13 08/28/2017   Pneumococcal Polysaccharide-23 03/05/2019   Respiratory Syncytial Virus Vaccine,Recomb Aduvanted(Arexvy) 05/20/2022   Td 07/24/2004, 09/17/2009   Tdap 04/14/2020   Zoster Recombinant(Shingrix ) 05/02/2019, 07/29/2019   Zoster, Live 10/25/2012    TDAP status: Up to date  Flu Vaccine status: Up to date  Pneumococcal vaccine status: Up to date  Covid-19 vaccine status: Declined, Education has been provided regarding the importance of this vaccine but patient still declined. Advised may receive this vaccine at local pharmacy or Health Dept.or vaccine clinic. Aware to provide a copy of the vaccination  record if obtained from local pharmacy or Health Dept. Verbalized acceptance and understanding.  Qualifies for Shingles Vaccine? Yes   Zostavax completed Yes   Shingrix  Completed?: Yes  Screening Tests Health Maintenance  Topic Date Due   COVID-19 Vaccine (9 - 2025-26 season) 05/28/2024 (Originally 03/24/2024)   Medicare Annual Wellness (AWV)  05/15/2025   Mammogram  08/12/2025   DEXA SCAN  05/24/2027   Colonoscopy  01/01/2029   DTaP/Tdap/Td (4 - Td or Tdap) 04/14/2030   Pneumococcal Vaccine: 50+ Years  Completed   Influenza Vaccine  Completed   Hepatitis C Screening  Completed   Zoster Vaccines- Shingrix   Completed   Meningococcal B Vaccine  Aged Out    Health Maintenance  There are no preventive care reminders to display for this patient.   Colorectal cancer screening: Type of screening: Colonoscopy. Completed 01/02/2024. Repeat every 5 years  Mammogram status: Completed 08/13/2023. Repeat every year  Bone Density status: Completed 05/23/2022. Results reflect: Bone density results: OSTEOPOROSIS. Repeat every 2 years.  Lung Cancer Screening: (Low  Dose CT Chest recommended if Age 41-80 years, 20 pack-year currently smoking OR have quit w/in 15years.) does not qualify.   Lung Cancer Screening Referral: No  Additional Screening:  Hepatitis C Screening: does not qualify; Completed 2017  Vision Screening: Recommended annual ophthalmology exams for early detection of glaucoma and other disorders of the eye. Is the patient up to date with their annual eye exam?  Yes  Who is the provider or what is the name of the office in which the patient attends annual eye exams? [facility].   Dental Screening: Recommended annual dental exams for proper oral hygiene   Community Resource Referral / Chronic Care Management: CRR required this visit?  No   CCM required this visit?  No     Plan:     I have personally reviewed and noted the following in the patient's chart:    Medical and social history Use of alcohol, tobacco or illicit drugs  Current medications and supplements including opioid prescriptions. Patient is not currently taking opioid prescriptions. Functional ability and status Nutritional status Physical activity Advanced directives List of other physicians Hospitalizations, surgeries, and ER visits in previous 12 months Vitals Screenings to include cognitive, depression, and falls Referrals and appointments  In addition, I have reviewed and discussed with patient certain preventive protocols, quality metrics, and best practice recommendations. A written personalized care plan for preventive services as well as general preventive health recommendations were provided to patient.     Michale ONEIDA Fogo, CMA   05/15/2024   After Visit Summary: (In Person-Printed) AVS printed and given to the patient

## 2024-05-15 NOTE — Assessment & Plan Note (Signed)
Chronic, stable.  Noted on x-ray imaging 11/15/21.  Recommend she continue statin as ordered and take daily Baby ASA for prevention.

## 2024-05-15 NOTE — Assessment & Plan Note (Signed)
 Chronic, ongoing.   - Continue Gabapentin . - Continue Magnesium and B12 supplements. - Takes mustard with benefit, continue this. - Could consider period of physical therapy or massage therapy if worsening.  Recommend she wear compression hose on during day and off at night.

## 2024-05-15 NOTE — Assessment & Plan Note (Addendum)
Chronic, ongoing.  Noted on labs initially 09/14/21, level 199.  Continue supplement and recheck today.

## 2024-05-15 NOTE — Assessment & Plan Note (Signed)
 Chronic.  On holiday from [place] , started January 2022.  DEXA on 05/23/22 noted ongoing osteopenia with some improvement in levels.  Vitamin D  level today. Repeat DEXA scan next in 2028 and restart medication if needed.

## 2024-05-15 NOTE — Progress Notes (Signed)
 BP 120/76 (BP Location: Left Arm, Patient Position: Sitting, Cuff Size: Normal)   Pulse 82   Temp 98 F (36.7 C) (Oral)   Ht 5' 3 (1.6 m)   Wt 180 lb 9.6 oz (81.9 kg)   LMP  (LMP Unknown)   SpO2 94%   BMI 31.99 kg/m    Subjective:    Patient ID: Patricia Ferguson, female    DOB: Feb 02, 1951, 73 y.o.   MRN: [id number]  HPI: Patricia Ferguson is a 73 y.o. female presenting on 05/15/2024 for comprehensive medical examination. Current medical complaints include:none  She currently lives with: husband -- he has been sober for 2 years Menopausal Symptoms: no  HYPERLIPIDEMIA Taking Zetia  and Crestor  3 days a week.  History of intolerance to statin therapy with muscle pain if taken every day. Hyperlipidemia status: good compliance Satisfied with current treatment?  yes Side effects:  no Medication compliance: good compliance Supplements: none Aspirin:  no The 10-year ASCVD risk score (Arnett DK, et al., 2019) is: 10%   Values used to calculate the score:     Age: 71 years     Clincally relevant sex: Female     Is Non-Hispanic African American: No     Diabetic: No     Tobacco smoker: No     Systolic Blood Pressure: 120 mmHg     Is BP treated: No     HDL Cholesterol: 45 mg/dL     Total Cholesterol: 149 mg/dL Chest pain:  no Coronary artery disease:  no Family history CAD:  grandfather had MI at 30 Family history early CAD:  no   LEG CRAMPS Takes Gabapentin  100 MG at bedtime. Has not had in a few weeks. Duration: chronic Pain: yes Location: lower legs Bilateral:  no Onset: ongoing Frequency: occasional Time of  day:  evening and night time Sudden unintentional leg jerking:   no Paresthesias:  no Decreased sensation:  no Weakness:   no Insomnia:   no Fatigue:   no Alleviating factors: walking sometimes help Aggravating factors: unknown Status: improvement Treatments attempted: Magnesium +Gabapentin  + iron + B12  OSTEOPENIA Last DEXA 05/23/22 with T-score -1.6. B12  and Vit D supplements taken. Satisfied with current treatment?: yes Adequate calcium  & vitamin D : yes Weight bearing exercises: yes      05/15/2024    8:06 AM 05/11/2023    8:26 AM 11/08/2022    8:21 AM 05/09/2022    8:17 AM 03/20/2022   11:11 AM  Depression screen PHQ 2/9  Decreased Interest 0 0 0 0 1  Down, Depressed, Hopeless 0 0 0 1 1  PHQ - 2 Score 0 0 0 1 2  Altered sleeping 0 0 0 1 1  Tired, decreased energy 0 1 0 0 1  Change in appetite 0 0 0 1 0  Feeling bad or failure about yourself  0 0 0 1 0  Trouble concentrating 0 0 0 0 0  Moving slowly or fidgety/restless 0 0 0 0 0  Suicidal thoughts 0 0 0 0 0  PHQ-9 Score 0 1 0 4 4  Difficult doing work/chores  Not difficult at all Not difficult at all Not difficult at all Somewhat difficult       05/15/2024    8:06 AM 05/11/2023    8:31 AM 11/08/2022    8:21 AM 05/09/2022    8:18 AM  GAD 7 : Generalized Anxiety Score  Nervous, Anxious, on Edge 0 0 0 1  Control/stop worrying  0 0 1 1  Worry too much - different things 0 0 1 0  Trouble relaxing 0 0 0 1  Restless 0 0 0 1  Easily annoyed or irritable 0 0 0 0  Afraid - awful might happen 0 0 0 1  Total GAD 7 Score 0 0 2 5  Anxiety Difficulty  Not difficult at all Not difficult at all Not difficult at all      05/09/2022    8:16 AM 05/09/2022    8:43 AM 06/02/2022   10:18 AM 11/08/2022    8:20 AM 05/11/2023    8:26 AM  Fall Risk  Falls in the past year? 0 0  0 0  Was there an injury with Fall? 0 0  0 0  Fall Risk Category Calculator 0 0  0 0  Fall Risk Category (Retired) Low  Low      (RETIRED) Patient Fall Risk Level Low fall risk  Low fall risk  Low fall risk     Patient at Risk for Falls Due to No Fall Risks No Fall Risks  No Fall Risks No Fall Risks  Fall risk Follow up Falls evaluation completed  Falls prevention discussed   Falls evaluation completed Falls evaluation completed     Data saved with a previous flowsheet row definition    Functional Status  Survey: Is the patient deaf or have difficulty hearing?: No Does the patient have difficulty seeing, even when wearing glasses/contacts?: No Does the patient have difficulty concentrating, remembering, or making decisions?: No Does the patient have difficulty walking or climbing stairs?: No Does the patient have difficulty dressing or bathing?: No Does the patient have difficulty doing errands alone such as visiting a doctor's office or shopping?: No   Past Medical History:  Past Medical History:  Diagnosis Date   Allergy    Arthritis    right knee   FH: colonic polyps    GERD (gastroesophageal reflux disease)    Hypercholesterolemia    Internal hemorrhoids    Obesity    Osteopenia    Pneumonia    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 SCREWS IN BACK   bone spur     BREAST BIOPSY Right 1980's   BREAST SURGERY     CERVICAL CONE BIOPSY     COLONOSCOPY N/A 01/02/2024   Procedure: COLONOSCOPY;  Surgeon: Marinda Jayson KIDD, MD;  Location: [facility] ENDOSCOPY;  Service: General;  Laterality: N/A;   COLONOSCOPY WITH PROPOFOL  N/A 06/02/2022   Procedure: COLONOSCOPY WITH PROPOFOL ;  Surgeon: Therisa Bi, MD;  Location: [facility] ENDOSCOPY;  Service: Gastroenterology;  Laterality: N/A;   ESOPHAGOGASTRODUODENOSCOPY N/A 01/02/2024   Procedure: EGD (ESOPHAGOGASTRODUODENOSCOPY);  Surgeon: Marinda Jayson KIDD, MD;  Location: [facility] ENDOSCOPY;  Service: General;  Laterality: N/A;   HAMMER TOE SURGERY Right 02/01/2023   INSERTION OF MESH  04/01/2024   Procedure: INSERTION OF MESH;  Surgeon: Jordis Laneta FALCON, MD;  Location: [facility] ORS;  Service: General;;   lumbar fission     MASS EXCISION Left 01/07/2020   Procedure: EXCISION MASS (RESECTION OF ANTECUBITAL VENOUS ANEURYSM);  Surgeon: Jama Cordella MATSU, MD;  Location: [facility] ORS;  Service: Vascular;  Laterality: Left;   SPINE SURGERY  2003  XI ROBOTIC ASSISTED PARAESOPHAGEAL HERNIA REPAIR N/A 04/01/2024   Procedure: REPAIR, HERNIA, PARAESOPHAGEAL, ROBOT-ASSISTED;  Surgeon: Jordis Laneta FALCON, MD;  Location: [facility] ORS;  Service: General;  Laterality: N/A;    Medications:  Current Outpatient Medications on File Prior to Visit  Medication Sig   b complex vitamins capsule Take 1 capsule by mouth daily.   Coenzyme Q10 (CO Q-10) 200 MG CAPS Take 200 mg by mouth daily.   Cranberry 4200 MG CAPS Take 4,200 mg by mouth daily.   hydroxypropyl methylcellulose / hypromellose (ISOPTO TEARS / GONIOVISC) 2.5 % ophthalmic solution Place 1 drop into both eyes daily.   Magnesium 250 MG TABS Take 250 mg by mouth daily.   Potassium 99 MG TABS Take 99 mg by mouth daily.   VITAMIN D -VITAMIN K PO Take 1 tablet by mouth daily.   No current facility-administered medications on file prior to visit.    Allergies:  Allergies  Allergen Reactions   Meloxicam  Other (See Comments)    Face got flushed and red.   Adhesive [Tape] Rash    PAPER TAPE OKAY   Lipitor [Atorvastatin] Other (See Comments)    Muscle aches   Oxycodone Rash   Percodan [Oxycodone-Aspirin] Rash   Pravachol [Pravastatin Sodium] Other (See Comments)    Muscle aches    Propoxyphene Other (See Comments)    depression    Social History:  Social History   Socioeconomic History   Marital status: Married    Spouse name: Patricia Ferguson   Number of children: Not on file   Years of education: some college    Highest education level: Associate degree: occupational, Scientist, product/process development, or vocational program  Occupational History   Occupation: Surveyor, 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    Lives with husband.   Social Drivers of Corporate investment banker Strain: Low Risk  (05/08/2024)   Overall Financial Resource Strain (CARDIA)    Difficulty of Paying Living Expenses: Not hard at all  Food Insecurity: No Food Insecurity (05/08/2024)   Hunger Vital Sign    Worried About Running Out of Food in the Last Year: Never true    Ran Out of Food in the Last Year: Never true  Transportation Needs: No Transportation Needs (05/08/2024)   PRAPARE - Administrator, Civil Service (Medical): No    Lack of Transportation (Non-Medical): No  Physical Activity: Insufficiently Active (05/08/2024)   Exercise Vital Sign    Days of Exercise per Week: 1 day    Minutes of Exercise per Session: 30 min  Stress: No Stress Concern Present (05/08/2024)   [employer] of Occupational Health - Occupational Stress Questionnaire    Feeling of Stress: Not at all  Social Connections: Socially Integrated (05/08/2024)   Social Connection and Isolation Panel    Frequency of Communication with Friends and Family: More than three times a week    Frequency of Social Gatherings with Friends and Family: Once a week    Attends Religious Services: More than 4 times per year  Active Member of Clubs or Organizations: Yes    Attends Banker Meetings: More than 4 times per year    Marital Status: Married  Catering manager Violence: Not At Risk (04/01/2024)   Humiliation, Afraid, Rape, and Kick questionnaire    Fear of Current or Ex-Partner: No    Emotionally Abused: No    Physically Abused: No    Sexually Abused: No   Social History   Tobacco Use  Smoking Status Never   Passive exposure: Never  Smokeless Tobacco Never   Social History   Substance and Sexual Activity  Alcohol Use Not Currently   Comment: on occasion     Family History:  Family History  Problem Relation Age of Onset   Diabetes Mother    Hyperlipidemia Mother    Hypertension Mother    Osteoporosis Mother    Arthritis Mother    Cancer Father        lung   Diabetes Father    Diabetes Sister    Hyperlipidemia Sister    Hypertension Sister    Hypertension Brother    Hyperlipidemia Sister    Breast cancer Neg Hx     Past medical history, surgical history, medications, allergies, family history and social history reviewed with patient today and changes made to appropriate areas of the chart.   ROS All other ROS negative except what is listed above and in the HPI.      Objective:    BP 120/76 (BP Location: Left Arm, Patient Position: Sitting, Cuff Size: Normal)   Pulse 82   Temp 98 F (36.7 C) (Oral)   Ht 5' 3 (1.6 m)   Wt 180 lb 9.6 oz (81.9 kg)   LMP  (LMP Unknown)   SpO2 94%   BMI 31.99 kg/m   Wt Readings from Last 3 Encounters:  05/15/24 180 lb 9.6 oz (81.9 kg)  04/22/24 178 lb (80.7 kg)  04/14/24 178 lb (80.7 kg)    Physical Exam Vitals and nursing note reviewed. Exam conducted with a chaperone present.  Constitutional:      General: She is awake. She is not in acute distress.    Appearance: She is well-developed and well-groomed. She is not ill-appearing or toxic-appearing.  HENT:     Head: Normocephalic and atraumatic.     Right Ear: Hearing, tympanic membrane, ear canal and external ear normal. No drainage.     Left Ear: Hearing, tympanic membrane, ear canal and external ear normal. No drainage.     Nose: Nose normal.     Right Sinus: No maxillary sinus tenderness or frontal sinus tenderness.     Left Sinus: No maxillary sinus tenderness or frontal sinus tenderness.     Mouth/Throat:     Mouth: Mucous membranes are moist.     Pharynx: Oropharynx is clear. Uvula midline. No pharyngeal swelling, oropharyngeal exudate or posterior oropharyngeal erythema.  Eyes:     General: Lids are normal.        Right  eye: No discharge.        Left eye: No discharge.     Extraocular Movements: Extraocular movements intact.     Conjunctiva/sclera: Conjunctivae normal.     Pupils: Pupils are equal, round, and reactive to light.     Visual Fields: Right eye visual fields normal and left eye visual fields normal.  Neck:     Thyroid: No thyromegaly.     Vascular: No carotid bruit.     Trachea: Trachea normal.  Cardiovascular:     Rate and Rhythm: Normal rate and regular rhythm.     Heart sounds: Normal heart sounds. No murmur heard.    No gallop.  Pulmonary:     Effort: Pulmonary effort is normal. No accessory muscle usage or respiratory distress.     Breath sounds: Normal breath sounds.  Chest:  Breasts:    Right: Normal.     Left: Normal.  Abdominal:     General: Bowel sounds are normal.     Palpations: Abdomen is soft. There is no hepatomegaly or splenomegaly.     Tenderness: There is no abdominal tenderness.  Musculoskeletal:        General: Normal range of motion.     Cervical back: Normal range of motion and neck supple.     Right lower leg: No edema.     Left lower leg: No edema.  Lymphadenopathy:     Head:     Right side of head: No submental, submandibular, tonsillar, preauricular or posterior auricular adenopathy.     Left side of head: No submental, submandibular, tonsillar, preauricular or posterior auricular adenopathy.     Cervical: No cervical adenopathy.     Upper Body:     Right upper body: No supraclavicular, axillary or pectoral adenopathy.     Left upper body: No supraclavicular, axillary or pectoral adenopathy.  Skin:    General: Skin is warm and dry.     Capillary Refill: Capillary refill takes less than 2 seconds.     Findings: No rash.  Neurological:     Mental Status: She is alert and oriented to person, place, and time.     Gait: Gait is intact.     Deep Tendon Reflexes: Reflexes are normal and symmetric.     Reflex Scores:      Brachioradialis reflexes are 2+ on  the right side and 2+ on the left side.      Patellar reflexes are 2+ on the right side and 2+ on the left side. Psychiatric:        Attention and Perception: Attention normal.        Mood and Affect: Mood normal.        Speech: Speech normal.        Behavior: Behavior normal. Behavior is cooperative.        Thought Content: Thought content normal.        Judgment: Judgment normal.       05/15/2024    8:05 AM 05/11/2023    8:31 AM 03/20/2022   11:07 AM 03/18/2021   11:19 AM 03/15/2020    9:04 AM  6CIT Screen  What Year? 0 points 0 points 0 points 0 points 0 points  What month? 0 points 0 points 0 points 0 points 0 points  What time? 0 points 0 points 0 points 0 points 0 points  Count back from 20 0 points 0 points 0 points 0 points 0 points  Months in reverse 2 points 0 points 0 points 0 points 0 points  Repeat phrase 0 points 2 points 0 points 2 points 0 points  Total Score 2 points 2 points 0 points 2 points 0 points   Results for orders placed or performed during the hospital encounter of 04/01/24  Surgical pathology   Collection Time: 04/01/24 12:00 AM  Result Value Ref Range   SURGICAL PATHOLOGY      SURGICAL PATHOLOGY [facility] [address] [place], [place] [address]  Telephone [phone] or [phone] Fax [phone]  REPORT OF SURGICAL PATHOLOGY   Accession #: [id number] Patient Name: Patricia Ferguson, Patricia Ferguson Visit # : [id number]  MRN: [id number] Physician: Jordis Cliche DOB/Age Jul 20, 1951 (Age: 75) Gender: F Collected Date: 04/01/2024 Received Date: 04/01/2024  FINAL DIAGNOSIS       1. Hernia sac,  :       -  UNREMARKABLE FIBROADIPOSE TISSUE, NEGATIVE FOR MALIGNANCY.       2. Hernia sac,  :       -  UNREMARKABLE FIBROADIPOSE TISSUE WITH FOCAL SMALL BENIGN LYMPH NODES.      -  NEGATIVE FOR MALIGNANCY.       ELECTRONIC SIGNATURE : Legolvan Do, Mark, Pathologist, Electronic Signature  MICROSCOPIC DESCRIPTION  CASE  COMMENTS STAINS USED IN DIAGNOSIS: H&E H&E    CLINICAL HISTORY  SPECIMEN(S) OBTAINED 1. Hernia sac, 2. Hernia sac,  SPECIMEN COMMENTS: SPECIMEN CLINICAL INFORMATION: 1. Paraesophageal hernia     Gross Description 1. Received fresh and placed in formalin is a 1.8 x 1.1 x 0.9 cm is a segment of fibroadipose tissue.The specimen is sectioned to reveal grossly unremarkable fibroadiposed tissue.The specimen is entirely submitted in block 1A. 2. Received in formalin is a 8.3 x 6.3 x 1.7 cm segment of hemorrhagic fibroadipose tissue.Sectioning reveals unremarkable fibroadipose tissue.Also received attached to the specimen 1.9 x 0.9 x 0.1 cm segment of gauze.Representative sections are submitted in block 2A.      ER/MB 04-02-24        Report signed out from the following location(s) [place] [address] [place], [place] [address] CLIA #: [id number]  [facility] [address] [place], [place] [address] CLIA #: [id number]   Basic metabolic panel   Collection Time: 04/02/24  4:36 AM  Result Value Ref Range   Sodium 141 135 - 145 mmol/L   Potassium 4.2 3.5 - 5.1 mmol/L   Chloride 105 98 - 111 mmol/L   CO2 26 22 - 32 mmol/L   Glucose, Bld 107 (H) 70 - 99 mg/dL   BUN 16 8 - 23 mg/dL   Creatinine, Ser 9.38 0.44 - 1.00 mg/dL   Calcium  8.4 (L) 8.9 - 10.3 mg/dL   GFR, Estimated >39 >39 mL/min   Anion gap 10 5 - 15  CBC   Collection Time: 04/02/24  4:36 AM  Result Value Ref Range   WBC 10.3 4.0 - 10.5 K/uL   RBC 3.64 (L) 3.87 - 5.11 MIL/uL   Hemoglobin 10.6 (L) 12.0 - 15.0 g/dL   HCT 68.1 (L) 63.9 - 53.9 %   MCV 87.4 80.0 - 100.0 fL   MCH 29.1 26.0 - 34.0 pg   MCHC 33.3 30.0 - 36.0 g/dL   RDW 87.2 88.4 - 84.4 %   Platelets 128 (L) 150 - 400 K/uL   nRBC 0.0 0.0 - 0.2 %      Assessment & Plan:   Problem List Items Addressed This Visit       Cardiovascular and Mediastinum   Aortic atherosclerosis   Chronic, stable.  Noted  on x-ray imaging 11/15/21.  Recommend she continue statin as ordered and take daily Baby ASA for prevention.      Relevant Medications   rosuvastatin  (CRESTOR ) 20 MG tablet   ezetimibe  (ZETIA ) 10 MG tablet   Other Relevant Orders   Comprehensive metabolic panel with GFR   Lipid Panel w/o Chol/HDL Ratio     Musculoskeletal and Integument  Osteopenia   Chronic.  On holiday from [place] , started January 2022.  DEXA on 05/23/22 noted ongoing osteopenia with some improvement in levels.  Vitamin D  level today. Repeat DEXA scan next in 2028 and restart medication if needed.      Relevant Orders   VITAMIN D  25 Hydroxy (Vit-D Deficiency, Fractures)     Other   Vitamin B 12 deficiency   Chronic, ongoing.  Noted on labs initially 09/14/21, level 199.  Continue supplement and recheck today.      Relevant Orders   Vitamin B12   Restless leg syndrome   Chronic, ongoing.   - Continue Gabapentin . - Continue Magnesium and B12 supplements. - Takes mustard with benefit, continue this. - Could consider period of physical therapy or massage therapy if worsening.  Recommend she wear compression hose on during day and off at night.      Relevant Orders   CBC with Differential/Platelet   TSH   Obesity   BMI 31.99.  Recommended eating smaller high protein, low fat meals more frequently and exercising 30 mins a day 5 times a week with a goal of 10-15lb weight loss in the next 3 months. Patient voiced their understanding and motivation to adhere to these recommendations.       Myalgia due to statin   History of trial on various statins with side effect of myalgia, continue Zetia  and tolerating Crestor  at three days a week at this time.  Continue this and if any adverse effects will consider change to Repatha.      Hyperlipidemia - Primary   Chronic, ongoing with poor statin tolerance in past. Continue current medication regimen, Zetia  and Crestor  as is tolerating this on 3 day a week regimen.  Could  consider discontinuation of Zetia  in future if improved levels.  Lipid panel today.      Relevant Medications   rosuvastatin  (CRESTOR ) 20 MG tablet   ezetimibe  (ZETIA ) 10 MG tablet   Other Relevant Orders   Comprehensive metabolic panel with GFR   Lipid Panel w/o Chol/HDL Ratio   Other Visit Diagnoses       Encounter for annual physical exam       Annual physical today with labs and health maintenance reviewed, discussed with patient.        Follow up plan: Return in about 6 months (around 11/13/2024) for HLD AND RLS, OSTEOPENIA.   LABORATORY TESTING:  - Pap smear: not applicable  IMMUNIZATIONS:   - Tdap: Tetanus vaccination status reviewed: last tetanus booster within 10 years. - Influenza: Up to date - Pneumovax: Up to date - Prevnar: Up to date - COVID: Up to date - HPV: Not applicable - Shingrix  vaccine: Up to date  SCREENING: -Mammogram: Up to date  - Colonoscopy: Up to date  - Bone Density: Not applicable  -Hearing Test: Not applicable  -Spirometry: Not applicable   PATIENT COUNSELING:   Advised to take 1 mg of folate supplement per day if capable of pregnancy.   Sexuality: Discussed sexually transmitted diseases, partner selection, use of condoms, avoidance of unintended pregnancy  and contraceptive alternatives.   Advised to avoid cigarette smoking.  I discussed with the patient that most people either abstain from alcohol or drink within safe limits (<=14/week and <=4 drinks/occasion for males, <=7/weeks and <= 3 drinks/occasion for females) and that the risk for alcohol disorders and other health effects rises proportionally with the number of drinks per week and how often a drinker exceeds daily limits.  Discussed cessation/primary prevention of drug use and availability of treatment for abuse.   Diet: Encouraged to adjust caloric intake to maintain  or achieve ideal body weight, to reduce intake of dietary saturated fat and total fat, to limit sodium  intake by avoiding high sodium foods and not adding table salt, and to maintain adequate dietary potassium and calcium  preferably from fresh fruits, vegetables, and low-fat dairy products.    Stressed the importance of regular exercise  Injury prevention: Discussed safety belts, safety helmets, smoke detector, smoking near bedding or upholstery.   Dental health: Discussed importance of regular tooth brushing, flossing, and dental visits.    NEXT PREVENTATIVE PHYSICAL DUE IN 1 YEAR. Return in about 6 months (around 11/13/2024) for HLD AND RLS, OSTEOPENIA.

## 2024-05-16 ENCOUNTER — Ambulatory Visit: Payer: Self-pay | Admitting: Nurse Practitioner

## 2024-05-16 LAB — CBC WITH DIFFERENTIAL/PLATELET
Basophils Absolute: 0.1 x10E3/uL (ref 0.0–0.2)
Basos: 1 %
EOS (ABSOLUTE): 0.1 x10E3/uL (ref 0.0–0.4)
Eos: 3 %
Hematocrit: 39.3 % (ref 34.0–46.6)
Hemoglobin: 12.5 g/dL (ref 11.1–15.9)
Immature Grans (Abs): 0 x10E3/uL (ref 0.0–0.1)
Immature Granulocytes: 0 %
Lymphocytes Absolute: 1.4 x10E3/uL (ref 0.7–3.1)
Lymphs: 33 %
MCH: 29.3 pg (ref 26.6–33.0)
MCHC: 31.8 g/dL (ref 31.5–35.7)
MCV: 92 fL (ref 79–97)
Monocytes Absolute: 0.4 x10E3/uL (ref 0.1–0.9)
Monocytes: 9 %
Neutrophils Absolute: 2.2 x10E3/uL (ref 1.4–7.0)
Neutrophils: 54 %
Platelets: 161 x10E3/uL (ref 150–450)
RBC: 4.26 x10E6/uL (ref 3.77–5.28)
RDW: 12.9 % (ref 11.7–15.4)
WBC: 4.2 x10E3/uL (ref 3.4–10.8)

## 2024-05-16 LAB — COMPREHENSIVE METABOLIC PANEL WITH GFR
ALT: 26 IU/L (ref 0–32)
AST: 34 IU/L (ref 0–40)
Albumin: 4 g/dL (ref 3.8–4.8)
Alkaline Phosphatase: 74 IU/L (ref 49–135)
BUN/Creatinine Ratio: 29 — ABNORMAL HIGH (ref 12–28)
BUN: 16 mg/dL (ref 8–27)
Bilirubin Total: 1.3 mg/dL — ABNORMAL HIGH (ref 0.0–1.2)
CO2: 24 mmol/L (ref 20–29)
Calcium: 8.8 mg/dL (ref 8.7–10.3)
Chloride: 102 mmol/L (ref 96–106)
Creatinine, Ser: 0.56 mg/dL — ABNORMAL LOW (ref 0.57–1.00)
Globulin, Total: 1.9 g/dL (ref 1.5–4.5)
Glucose: 95 mg/dL (ref 70–99)
Potassium: 4.1 mmol/L (ref 3.5–5.2)
Sodium: 139 mmol/L (ref 134–144)
Total Protein: 5.9 g/dL — ABNORMAL LOW (ref 6.0–8.5)
eGFR: 97 mL/min/1.73 (ref >59)

## 2024-05-16 LAB — LIPID PANEL W/O CHOL/HDL RATIO
Cholesterol, Total: 137 mg/dL (ref 100–199)
HDL: 48 mg/dL (ref >39)
LDL Chol Calc (NIH): 76 mg/dL (ref 0–99)
Triglycerides: 64 mg/dL (ref 0–149)
VLDL Cholesterol Cal: 13 mg/dL (ref 5–40)

## 2024-05-16 LAB — TSH: TSH: 2.53 u[IU]/mL (ref 0.450–4.500)

## 2024-05-16 LAB — VITAMIN B12: Vitamin B-12: 468 pg/mL (ref 232–1245)

## 2024-05-16 LAB — VITAMIN D 25 HYDROXY (VIT D DEFICIENCY, FRACTURES): Vit D, 25-Hydroxy: 35 ng/mL (ref 30.0–100.0)

## 2024-05-16 NOTE — Progress Notes (Signed)
 Contacted via MyChart  Good evening Patricia Ferguson, your labs have returned and overall are stable. No medication changes needed.  Any questions? Keep being stellar!!  Thank you for allowing me to participate in your care.  I appreciate you. Kindest regards, Kveon Casanas

## 2024-07-14 ENCOUNTER — Encounter: Payer: Self-pay | Admitting: Surgery

## 2024-07-14 ENCOUNTER — Ambulatory Visit: Admitting: Surgery

## 2024-07-14 VITALS — BP 127/81 | HR 65 | Temp 98.2°F | Ht 63.0 in | Wt 181.2 lb

## 2024-07-14 DIAGNOSIS — K449 Diaphragmatic hernia without obstruction or gangrene: Secondary | ICD-10-CM

## 2024-07-14 DIAGNOSIS — Z09 Encounter for follow-up examination after completed treatment for conditions other than malignant neoplasm: Secondary | ICD-10-CM | POA: Diagnosis not present

## 2024-07-14 NOTE — Patient Instructions (Signed)
 Surgery to Stop Reflux (Open Nissen Fundoplication): What to Know After After having a surgery called open Nissen fundoplication to stop reflux, it's common to have pain when you swallow. You may also have: Trouble swallowing. Soreness in your belly or chest. Bloating. Follow these instructions at home: Medicines Take your medicines only as told. Ask your health care provider if you can crush any pill that you need to take. Take only liquid medicines as told. Ask your provider if it's safe to drive or use machines while taking your medicine. Eating and drinking Eat and drink as told. You may need to: Only have liquids for 2 weeks. After that, you may need to only have soft foods for 2 weeks. Eat slowly. Take small bites. Chew your food well. Eat or drink only while upright. Go back to your normal diet slowly. Drink more fluids as told. Caring for your cut from surgery  Take care of your cut from surgery as told. Make sure you: Wash your hands with soap and water  for at least 20 seconds before and after you change your bandage. If you can't use soap and water , use hand sanitizer. Change your bandage. Leave stitches or skin glue alone. Leave tape strips alone unless you're told to take them off. You may trim the edges of the tape strips if they curl up. Check the area around your cut every day for signs of infection. Check for: More redness, swelling, or pain. More fluid or blood. Warmth. Pus or a bad smell. Activity If you were given a sedative, do not drive or use machines until you're told it's safe. A sedative can make you sleepy. Rest as told. Get up to take short walks at least every 2 hours many times during the day. This helps you breathe better and keeps your blood flowing. Ask for help if you feel weak or unsteady. Do not lift anything heavier than 10 lb (4.5 kg) until you're told it's OK. This may be after about 6 weeks. Try not to do things that take a lot of  effort. Ask when you can have sex again. Ask what things are safe for you to do at home. Ask when you can go back to work or school. General instructions Do not take baths, swim, or use a hot tub until you're told it's OK. Ask if you can shower. Do not smoke, vape, or use nicotine or tobacco. Doing this can slow down healing. Your provider may give you more instructions. Make sure you know what you can and can't do. Contact a health care provider if: You have chills or a fever. You have more trouble swallowing. You have painful bloating. You have heartburn that won't go away. Your pain doesn't get better with medicine. You throw up a lot or often feel like you may throw up. Your cut from surgery opens up. You have any signs of infection. Get help right away if: You have signs of a perforation. These include: Very bad pain. Throwing up and not being able to stop. A fever. A heart beat that's too fast. You have very bad pain or bloating. You keep throwing up and can't stop. You have blood in your throw up. You have chest pain or trouble breathing. These symptoms may be an emergency. Call 911 right away. Do not wait to see if the symptoms will go away. Do not drive yourself to the hospital. This information is not intended to replace advice given to you by your health  care provider. Make sure you discuss any questions you have with your health care provider. Document Revised: 03/01/2023 Document Reviewed: 03/01/2023 Elsevier Patient Education  2024 [employer].

## 2024-07-14 NOTE — Progress Notes (Signed)
 Outpatient Surgical Follow Up  07/14/2024  Patricia Ferguson is an 73 y.o. female.   No chief complaint on file.   HPI: Patricia Ferguson is 3 1/2 months s/p robotic paraesophageal hernia repair w partial fundoplication. Doing very well , no reflux and no indigestion. 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 SCREWS IN BACK   bone spur     BREAST BIOPSY Right 1980's   BREAST SURGERY     CERVICAL CONE BIOPSY     COLONOSCOPY N/A 01/02/2024   Procedure: COLONOSCOPY;  Surgeon: Marinda Jayson KIDD, MD;  Location: [facility] ENDOSCOPY;  Service: General;  Laterality: N/A;   COLONOSCOPY WITH PROPOFOL  N/A 06/02/2022   Procedure: COLONOSCOPY WITH PROPOFOL ;  Surgeon: Therisa Bi, MD;  Location: [facility] ENDOSCOPY;  Service: Gastroenterology;  Laterality: N/A;   ESOPHAGOGASTRODUODENOSCOPY N/A 01/02/2024   Procedure: EGD (ESOPHAGOGASTRODUODENOSCOPY);  Surgeon: Marinda Jayson KIDD, MD;  Location: [facility] ENDOSCOPY;  Service: General;  Laterality: N/A;   HAMMER TOE SURGERY Right 02/01/2023   INSERTION OF MESH  04/01/2024   Procedure: INSERTION OF MESH;  Surgeon: Jordis Laneta FALCON, MD;  Location: [facility] ORS;  Service: General;;   lumbar fission     MASS EXCISION Left 01/07/2020   Procedure: EXCISION MASS (RESECTION OF ANTECUBITAL VENOUS ANEURYSM);  Surgeon: Jama Cordella MATSU, MD;  Location: [facility] ORS;  Service: Vascular;  Laterality: Left;   SPINE SURGERY  2003   XI ROBOTIC ASSISTED PARAESOPHAGEAL HERNIA REPAIR N/A 04/01/2024   Procedure: REPAIR, HERNIA, PARAESOPHAGEAL,  ROBOT-ASSISTED;  Surgeon: Jordis Laneta FALCON, MD;  Location: [facility] ORS;  Service: General;  Laterality: N/A;    Family History  Problem Relation Age of Onset   Diabetes Mother    Hyperlipidemia Mother    Hypertension Mother    Osteoporosis Mother    Arthritis Mother    Cancer Father        lung   Diabetes Father    Diabetes Sister    Hyperlipidemia Sister    Hypertension Sister    Hypertension Brother    Hyperlipidemia Sister    Breast cancer Neg Hx     Social History:  reports that she has never smoked. She has never been exposed to tobacco smoke. She has never used smokeless tobacco. She reports that she does not currently use alcohol. She reports that she does not use drugs.  Allergies: Allergies[1]  Medications reviewed.    ROS Full ROS performed and is otherwise negative other than what is stated in HPI   LMP  (LMP Unknown)   Physical Exam Vitals and nursing note reviewed. Exam conducted with a chaperone present.  Constitutional:      Appearance: Normal appearance. She is not ill-appearing.  Cardiovascular:     Pulses: Normal pulses.     Heart sounds: Normal heart sounds.  Pulmonary:     Effort: Pulmonary effort is normal. No respiratory distress.     Breath sounds:  Normal breath sounds.  Abdominal:     General: Abdomen is flat. There is no distension.     Palpations: Abdomen is soft. There is no mass.     Tenderness: There is no abdominal tenderness. There is no guarding or rebound.     Hernia: No hernia is present.     Comments: Scars healed  Musculoskeletal:     Cervical back: Normal range of motion.  Skin:    General: Skin is warm and dry.     Capillary Refill: Capillary refill takes less than 2 seconds.  Neurological:     General: No focal deficit present.     Mental Status: She is alert and oriented to person, place, and time.  Psychiatric:        Mood and Affect: Mood normal.        Behavior: Behavior normal.        Thought Content: Thought content  normal.        Judgment: Judgment normal.      Assessment/Plan: Doing very well w/o complications. No reflux She is very appreciative RTC prn I personally spent a total of 20 minutes in the care of the patient today including performing a medically appropriate exam/evaluation, counseling and educating, placing orders, referring and communicating with other health care professionals, documenting clinical information in the EHR, independently interpreting and reviewing images studies and coordinating care.   Laneta Luna, MD FACS General Surgeon    [1]  Allergies Allergen Reactions   Meloxicam  Other (See Comments)    Face got flushed and red.   Adhesive [Tape] Rash    PAPER TAPE OKAY   Lipitor [Atorvastatin] Other (See Comments)    Muscle aches   Oxycodone Rash   Percodan [Oxycodone-Aspirin] Rash   Pravachol [Pravastatin Sodium] Other (See Comments)    Muscle aches    Propoxyphene Other (See Comments)    depression

## 2024-08-04 ENCOUNTER — Other Ambulatory Visit: Payer: Self-pay | Admitting: Nurse Practitioner

## 2024-08-04 DIAGNOSIS — Z1231 Encounter for screening mammogram for malignant neoplasm of breast: Secondary | ICD-10-CM

## 2024-08-20 ENCOUNTER — Ambulatory Visit: Payer: Self-pay | Admitting: Nurse Practitioner

## 2024-08-20 ENCOUNTER — Ambulatory Visit
Admission: RE | Admit: 2024-08-20 | Discharge: 2024-08-20 | Disposition: A | Discharge status: Home / Self Care | Source: Ambulatory Visit | Attending: Nurse Practitioner | Admitting: Nurse Practitioner

## 2024-08-20 DIAGNOSIS — Z1231 Encounter for screening mammogram for malignant neoplasm of breast: Secondary | ICD-10-CM | POA: Insufficient documentation

## 2024-08-20 NOTE — Progress Notes (Signed)
 Contacted via MyChart   Normal mammogram, may repeat in one year:)

## 2024-11-13 ENCOUNTER — Ambulatory Visit: Admitting: Nurse Practitioner
